# Patient Record
Sex: Male | Born: 1965 | Race: Black or African American | Hispanic: No | Marital: Married | Smoking: Never smoker
Health system: Southern US, Community
[De-identification: ages and names within clinical notes are randomized; demographics above are authoritative.]

## PROBLEM LIST (undated history)

## (undated) DIAGNOSIS — I10 Essential (primary) hypertension: Secondary | ICD-10-CM

## (undated) DIAGNOSIS — Z89511 Acquired absence of right leg below knee: Secondary | ICD-10-CM

## (undated) DIAGNOSIS — E119 Type 2 diabetes mellitus without complications: Secondary | ICD-10-CM

---

## 2005-08-30 ENCOUNTER — Encounter: Payer: Self-pay | Admitting: Cardiology

## 2007-01-30 ENCOUNTER — Encounter
Admission: RE | Admit: 2007-01-30 | Discharge: 2007-01-30 | Payer: Self-pay | Admitting: Physical Medicine and Rehabilitation

## 2007-12-03 DIAGNOSIS — E119 Type 2 diabetes mellitus without complications: Secondary | ICD-10-CM | POA: Insufficient documentation

## 2007-12-03 DIAGNOSIS — M19079 Primary osteoarthritis, unspecified ankle and foot: Secondary | ICD-10-CM | POA: Insufficient documentation

## 2007-12-03 DIAGNOSIS — M1610 Unilateral primary osteoarthritis, unspecified hip: Secondary | ICD-10-CM

## 2007-12-03 DIAGNOSIS — M5137 Other intervertebral disc degeneration, lumbosacral region: Secondary | ICD-10-CM | POA: Insufficient documentation

## 2007-12-03 DIAGNOSIS — M51379 Other intervertebral disc degeneration, lumbosacral region without mention of lumbar back pain or lower extremity pain: Secondary | ICD-10-CM | POA: Insufficient documentation

## 2007-12-03 DIAGNOSIS — M5126 Other intervertebral disc displacement, lumbar region: Secondary | ICD-10-CM | POA: Insufficient documentation

## 2007-12-03 HISTORY — DX: Unilateral primary osteoarthritis, unspecified hip: M16.10

## 2007-12-03 HISTORY — DX: Other intervertebral disc displacement, lumbar region: M51.26

## 2007-12-03 HISTORY — DX: Primary osteoarthritis, unspecified ankle and foot: M19.079

## 2007-12-03 HISTORY — DX: Other intervertebral disc degeneration, lumbosacral region without mention of lumbar back pain or lower extremity pain: M51.379

## 2008-01-29 DIAGNOSIS — M1460 Charcot's joint, unspecified site: Secondary | ICD-10-CM

## 2008-01-29 DIAGNOSIS — G609 Hereditary and idiopathic neuropathy, unspecified: Secondary | ICD-10-CM | POA: Insufficient documentation

## 2008-01-29 HISTORY — DX: Charcot's joint, unspecified site: M14.60

## 2008-08-30 DIAGNOSIS — G8929 Other chronic pain: Secondary | ICD-10-CM | POA: Insufficient documentation

## 2010-09-05 ENCOUNTER — Encounter: Payer: Self-pay | Admitting: Cardiology

## 2010-09-06 ENCOUNTER — Encounter: Payer: Self-pay | Admitting: Cardiology

## 2010-09-11 ENCOUNTER — Encounter: Payer: Self-pay | Admitting: Cardiology

## 2010-09-15 ENCOUNTER — Encounter: Payer: Self-pay | Admitting: Cardiology

## 2010-10-15 ENCOUNTER — Encounter: Payer: Self-pay | Admitting: Cardiology

## 2010-10-15 DIAGNOSIS — I10 Essential (primary) hypertension: Secondary | ICD-10-CM | POA: Insufficient documentation

## 2010-10-15 DIAGNOSIS — R079 Chest pain, unspecified: Secondary | ICD-10-CM | POA: Insufficient documentation

## 2010-10-15 DIAGNOSIS — E119 Type 2 diabetes mellitus without complications: Secondary | ICD-10-CM | POA: Insufficient documentation

## 2010-10-15 DIAGNOSIS — G4733 Obstructive sleep apnea (adult) (pediatric): Secondary | ICD-10-CM | POA: Insufficient documentation

## 2010-10-25 NOTE — Letter (Signed)
Summary: [facility] D/C DR. Wilburt Finlay  [facility] D/C DR. Wilburt Finlay   Imported By: Zachary George 10/15/2010 13:12:30  _____________________________________________________________________  External Attachment:    Type:   Image     Comment:   External Document

## 2010-10-25 NOTE — Medication Information (Signed)
Summary: [facility] D/C MEDICATION SHEET ORDER  [facility] D/C MEDICATION SHEET ORDER   Imported By: Zachary George 10/15/2010 13:18:22  _____________________________________________________________________  External Attachment:    Type:   Image     Comment:   External Document

## 2018-07-23 DIAGNOSIS — M19071 Primary osteoarthritis, right ankle and foot: Secondary | ICD-10-CM

## 2018-07-23 DIAGNOSIS — M19072 Primary osteoarthritis, left ankle and foot: Secondary | ICD-10-CM | POA: Insufficient documentation

## 2018-07-23 HISTORY — DX: Primary osteoarthritis, left ankle and foot: M19.071

## 2018-09-21 DIAGNOSIS — M21969 Unspecified acquired deformity of unspecified lower leg: Secondary | ICD-10-CM

## 2018-09-21 HISTORY — DX: Unspecified acquired deformity of unspecified lower leg: M21.969

## 2018-09-21 HISTORY — DX: Morbid (severe) obesity due to excess calories: E66.01

## 2019-10-29 DIAGNOSIS — U071 COVID-19: Secondary | ICD-10-CM | POA: Insufficient documentation

## 2019-10-29 HISTORY — DX: COVID-19: U07.1

## 2020-06-02 DIAGNOSIS — I119 Hypertensive heart disease without heart failure: Secondary | ICD-10-CM | POA: Insufficient documentation

## 2020-09-25 DIAGNOSIS — G988 Other disorders of nervous system: Secondary | ICD-10-CM | POA: Insufficient documentation

## 2020-09-25 DIAGNOSIS — E1169 Type 2 diabetes mellitus with other specified complication: Secondary | ICD-10-CM | POA: Insufficient documentation

## 2020-09-25 HISTORY — DX: Type 2 diabetes mellitus with other specified complication: E11.69

## 2020-11-01 DIAGNOSIS — E11621 Type 2 diabetes mellitus with foot ulcer: Secondary | ICD-10-CM

## 2020-11-01 HISTORY — DX: Type 2 diabetes mellitus with foot ulcer: E11.621

## 2020-11-04 ENCOUNTER — Inpatient Hospital Stay (HOSPITAL_COMMUNITY)
Admission: AD | Admit: 2020-11-04 | Discharge: 2020-11-09 | DRG: 252 | Disposition: A | Discharge status: Home / Self Care | Payer: Medicare HMO | Source: Other Acute Inpatient Hospital | Attending: Internal Medicine | Admitting: Internal Medicine

## 2020-11-04 ENCOUNTER — Other Ambulatory Visit: Payer: Self-pay

## 2020-11-04 ENCOUNTER — Encounter (HOSPITAL_COMMUNITY): Payer: Self-pay | Admitting: Internal Medicine

## 2020-11-04 DIAGNOSIS — I10 Essential (primary) hypertension: Secondary | ICD-10-CM | POA: Diagnosis present

## 2020-11-04 DIAGNOSIS — Z89421 Acquired absence of other right toe(s): Secondary | ICD-10-CM | POA: Diagnosis not present

## 2020-11-04 DIAGNOSIS — E1152 Type 2 diabetes mellitus with diabetic peripheral angiopathy with gangrene: Secondary | ICD-10-CM | POA: Diagnosis present

## 2020-11-04 DIAGNOSIS — E871 Hypo-osmolality and hyponatremia: Secondary | ICD-10-CM | POA: Diagnosis present

## 2020-11-04 DIAGNOSIS — L089 Local infection of the skin and subcutaneous tissue, unspecified: Secondary | ICD-10-CM | POA: Diagnosis present

## 2020-11-04 DIAGNOSIS — I152 Hypertension secondary to endocrine disorders: Secondary | ICD-10-CM | POA: Diagnosis present

## 2020-11-04 DIAGNOSIS — E876 Hypokalemia: Secondary | ICD-10-CM | POA: Diagnosis not present

## 2020-11-04 DIAGNOSIS — E1121 Type 2 diabetes mellitus with diabetic nephropathy: Secondary | ICD-10-CM | POA: Diagnosis present

## 2020-11-04 DIAGNOSIS — M60871 Other myositis, right ankle and foot: Secondary | ICD-10-CM | POA: Diagnosis present

## 2020-11-04 DIAGNOSIS — Z79899 Other long term (current) drug therapy: Secondary | ICD-10-CM

## 2020-11-04 DIAGNOSIS — E119 Type 2 diabetes mellitus without complications: Secondary | ICD-10-CM

## 2020-11-04 DIAGNOSIS — M19071 Primary osteoarthritis, right ankle and foot: Secondary | ICD-10-CM | POA: Diagnosis present

## 2020-11-04 DIAGNOSIS — L97518 Non-pressure chronic ulcer of other part of right foot with other specified severity: Secondary | ICD-10-CM | POA: Diagnosis present

## 2020-11-04 DIAGNOSIS — D638 Anemia in other chronic diseases classified elsewhere: Secondary | ICD-10-CM | POA: Diagnosis present

## 2020-11-04 DIAGNOSIS — Z794 Long term (current) use of insulin: Secondary | ICD-10-CM

## 2020-11-04 DIAGNOSIS — E43 Unspecified severe protein-calorie malnutrition: Secondary | ICD-10-CM | POA: Diagnosis present

## 2020-11-04 DIAGNOSIS — I739 Peripheral vascular disease, unspecified: Secondary | ICD-10-CM

## 2020-11-04 DIAGNOSIS — R262 Difficulty in walking, not elsewhere classified: Secondary | ICD-10-CM | POA: Diagnosis present

## 2020-11-04 DIAGNOSIS — E11628 Type 2 diabetes mellitus with other skin complications: Secondary | ICD-10-CM

## 2020-11-04 DIAGNOSIS — E1140 Type 2 diabetes mellitus with diabetic neuropathy, unspecified: Secondary | ICD-10-CM | POA: Diagnosis present

## 2020-11-04 DIAGNOSIS — I70235 Atherosclerosis of native arteries of right leg with ulceration of other part of foot: Secondary | ICD-10-CM | POA: Diagnosis present

## 2020-11-04 DIAGNOSIS — E1165 Type 2 diabetes mellitus with hyperglycemia: Secondary | ICD-10-CM | POA: Diagnosis present

## 2020-11-04 DIAGNOSIS — E1159 Type 2 diabetes mellitus with other circulatory complications: Secondary | ICD-10-CM | POA: Diagnosis present

## 2020-11-04 DIAGNOSIS — Z20822 Contact with and (suspected) exposure to covid-19: Secondary | ICD-10-CM | POA: Diagnosis present

## 2020-11-04 DIAGNOSIS — L03115 Cellulitis of right lower limb: Secondary | ICD-10-CM | POA: Diagnosis present

## 2020-11-04 DIAGNOSIS — Z7984 Long term (current) use of oral hypoglycemic drugs: Secondary | ICD-10-CM | POA: Diagnosis not present

## 2020-11-04 DIAGNOSIS — E11621 Type 2 diabetes mellitus with foot ulcer: Secondary | ICD-10-CM | POA: Diagnosis present

## 2020-11-04 DIAGNOSIS — Z6841 Body Mass Index (BMI) 40.0 and over, adult: Secondary | ICD-10-CM

## 2020-11-04 DIAGNOSIS — E1151 Type 2 diabetes mellitus with diabetic peripheral angiopathy without gangrene: Secondary | ICD-10-CM

## 2020-11-04 HISTORY — DX: Local infection of the skin and subcutaneous tissue, unspecified: E11.628

## 2020-11-04 HISTORY — DX: Essential (primary) hypertension: I10

## 2020-11-04 HISTORY — DX: Type 2 diabetes mellitus without complications: E11.9

## 2020-11-04 LAB — HIV ANTIBODY (ROUTINE TESTING W REFLEX): HIV Screen 4th Generation wRfx: NONREACTIVE

## 2020-11-04 LAB — GLUCOSE, CAPILLARY
Glucose-Capillary: 143 mg/dL — ABNORMAL HIGH (ref 70–99)
Glucose-Capillary: 157 mg/dL — ABNORMAL HIGH (ref 70–99)
Glucose-Capillary: 164 mg/dL — ABNORMAL HIGH (ref 70–99)
Glucose-Capillary: 143 mg/dL — ABNORMAL HIGH (ref 70–99)

## 2020-11-04 LAB — COMPREHENSIVE METABOLIC PANEL
ALT: 23 U/L (ref 0–44)
AST: 24 U/L (ref 15–41)
Albumin: 2.2 g/dL — ABNORMAL LOW (ref 3.5–5.0)
Alkaline Phosphatase: 81 U/L (ref 38–126)
Anion gap: 9 (ref 5–15)
BUN: 11 mg/dL (ref 6–20)
CO2: 23 mmol/L (ref 22–32)
Calcium: 8.1 mg/dL — ABNORMAL LOW (ref 8.9–10.3)
Chloride: 100 mmol/L (ref 98–111)
Creatinine, Ser: 0.94 mg/dL (ref 0.61–1.24)
GFR, Estimated: >60 mL/min (ref >60)
Glucose, Bld: 150 mg/dL — ABNORMAL HIGH (ref 70–99)
Potassium: 3.7 mmol/L (ref 3.5–5.1)
Sodium: 132 mmol/L — ABNORMAL LOW (ref 135–145)
Total Bilirubin: 1.3 mg/dL — ABNORMAL HIGH (ref 0.3–1.2)
Total Protein: 6 g/dL — ABNORMAL LOW (ref 6.5–8.1)

## 2020-11-04 LAB — CBC
HCT: 33.3 % — ABNORMAL LOW (ref 39.0–52.0)
Hemoglobin: 10.9 g/dL — ABNORMAL LOW (ref 13.0–17.0)
MCH: 26.6 pg (ref 26.0–34.0)
MCHC: 32.7 g/dL (ref 30.0–36.0)
MCV: 81.2 fL (ref 80.0–100.0)
Platelets: 433 10*3/uL — ABNORMAL HIGH (ref 150–400)
RBC: 4.1 MIL/uL — ABNORMAL LOW (ref 4.22–5.81)
RDW: 12.6 % (ref 11.5–15.5)
WBC: 14.3 10*3/uL — ABNORMAL HIGH (ref 4.0–10.5)
nRBC: 0 % (ref 0.0–0.2)

## 2020-11-04 LAB — LACTIC ACID, PLASMA
Lactic Acid, Venous: 1 mmol/L (ref 0.5–1.9)
Lactic Acid, Venous: 1.1 mmol/L (ref 0.5–1.9)

## 2020-11-04 LAB — HEMOGLOBIN A1C
Hgb A1c MFr Bld: 9.9 % — ABNORMAL HIGH (ref 4.8–5.6)
Mean Plasma Glucose: 237.43 mg/dL

## 2020-11-04 MED ORDER — SODIUM CHLORIDE 0.9 % IV SOLN: INTRAVENOUS | Status: DC

## 2020-11-04 MED ORDER — SODIUM CHLORIDE 0.9 % IV BOLUS
1000.0000 mL | Freq: Once | INTRAVENOUS | Status: AC
  Administered 2020-11-04: 1000 mL via INTRAVENOUS

## 2020-11-04 MED ORDER — MORPHINE SULFATE (PF) 2 MG/ML IV SOLN
1.0000 mg | INTRAVENOUS | Status: DC | PRN
  Administered 2020-11-04 – 2020-11-06 (×6): 1 mg via INTRAVENOUS
  Filled 2020-11-04 (×8): qty 1

## 2020-11-04 MED ORDER — HEPARIN SODIUM (PORCINE) 5000 UNIT/ML IJ SOLN
5000.0000 [IU] | Freq: Three times a day (TID) | INTRAMUSCULAR | Status: DC
  Administered 2020-11-04 – 2020-11-09 (×15): 5000 [IU] via SUBCUTANEOUS
  Filled 2020-11-04 (×14): qty 1

## 2020-11-04 MED ORDER — METFORMIN HCL ER 500 MG PO TB24
1000.0000 mg | ORAL_TABLET | Freq: Every day | ORAL | Status: DC
  Administered 2020-11-05: 1000 mg via ORAL
  Filled 2020-11-04: qty 2

## 2020-11-04 MED ORDER — INSULIN ASPART 100 UNIT/ML ~~LOC~~ SOLN: 0.0000 [IU] | Freq: Every day | SUBCUTANEOUS | Status: DC

## 2020-11-04 MED ORDER — OXYCODONE-ACETAMINOPHEN 5-325 MG PO TABS
2.0000 | ORAL_TABLET | ORAL | Status: DC | PRN
Start: 2020-11-04 — End: 2020-11-09
  Administered 2020-11-04 – 2020-11-08 (×12): 2 via ORAL
  Filled 2020-11-04 (×12): qty 2

## 2020-11-04 MED ORDER — INSULIN ASPART 100 UNIT/ML ~~LOC~~ SOLN
0.0000 [IU] | Freq: Three times a day (TID) | SUBCUTANEOUS | Status: DC
  Administered 2020-11-04 – 2020-11-08 (×7): 3 [IU] via SUBCUTANEOUS

## 2020-11-04 MED ORDER — LISINOPRIL 40 MG PO TABS
40.0000 mg | ORAL_TABLET | Freq: Every day | ORAL | Status: DC
  Administered 2020-11-04 – 2020-11-09 (×6): 40 mg via ORAL
  Filled 2020-11-04 (×7): qty 1

## 2020-11-04 MED ORDER — CLINDAMYCIN PHOSPHATE 600 MG/50ML IV SOLN
600.0000 mg | Freq: Three times a day (TID) | INTRAVENOUS | Status: AC
  Administered 2020-11-04 – 2020-11-07 (×10): 600 mg via INTRAVENOUS
  Filled 2020-11-04 (×9): qty 50

## 2020-11-04 MED ORDER — PIPERACILLIN-TAZOBACTAM 3.375 G IVPB
3.3750 g | Freq: Three times a day (TID) | INTRAVENOUS | Status: DC
  Administered 2020-11-04 – 2020-11-09 (×15): 3.375 g via INTRAVENOUS
  Filled 2020-11-04 (×15): qty 50

## 2020-11-04 MED ORDER — INSULIN DETEMIR 100 UNIT/ML ~~LOC~~ SOLN
35.0000 [IU] | Freq: Every day | SUBCUTANEOUS | Status: DC
  Administered 2020-11-04: 35 [IU] via SUBCUTANEOUS
  Filled 2020-11-04 (×3): qty 0.35

## 2020-11-04 NOTE — Consult Note (Signed)
Referring Physician: Dr Delight Ovens  Patient name: Roy Taylor MRN: [id number] DOB: 21-Nov-1965 Sex: male  REASON FOR CONSULT: non healing wound right foot  HPI: Roy Taylor is a 55 y.o. male, with a several day history of a wound on his right 5th toe.  He thinks this may have started with a new pair of shoes.  He was seen at [facility] and had amputation of right 5th toe for gangrene.  He was transferred here for further evaluation due to decreased ABI.  He has no history of smoking.  His baseline A1C is around 9.  He has no history of claudication.  He is ambulatory but is on disability due to problems walking secondary to a right ankle degenerative arthritis.  Other medical problems include hypertension.  Past Medical History:  Diagnosis Date  . Diabetes mellitus without complication (HCC)   . Hypertension    History reviewed. No pertinent surgical history.  History reviewed. No pertinent family history.  SOCIAL HISTORY: Social History   Socioeconomic History  . Marital status: Married    Spouse name: Not on file  . Number of children: Not on file  . Years of education: Not on file  . Highest education level: Not on file  Occupational History  . Not on file  Tobacco Use  . Smoking status: Never Smoker  . Smokeless tobacco: Never Used  Substance and Sexual Activity  . Alcohol use: Not Currently  . Drug use: Never  . Sexual activity: Not on file  Other Topics Concern  . Not on file  Social History Narrative  . Not on file   Social Determinants of Health   Financial Resource Strain: Not on file  Food Insecurity: Not on file  Transportation Needs: Not on file  Physical Activity: Not on file  Stress: Not on file  Social Connections: Not on file  Intimate Partner Violence: Not on file    No Known Allergies  Current Facility-Administered Medications  Medication Dose Route Frequency Provider Last Rate Last Admin  . clindamycin (CLEOCIN) IVPB 600 mg  600  mg Intravenous Q8H John Giovanni, MD      . heparin injection 5,000 Units  5,000 Units Subcutaneous Q8H John Giovanni, MD      . insulin aspart (novoLOG) injection 0-15 Units  0-15 Units Subcutaneous TID WC John Giovanni, MD      . insulin aspart (novoLOG) injection 0-5 Units  0-5 Units Subcutaneous QHS John Giovanni, MD      . insulin detemir (LEVEMIR) injection 35 Units  35 Units Subcutaneous QHS John Giovanni, MD      . lisinopril (ZESTRIL) tablet 40 mg  40 mg Oral Daily John Giovanni, MD      . morphine 2 MG/ML injection 1 mg  1 mg Intravenous Q3H PRN John Giovanni, MD      . piperacillin-tazobactam (ZOSYN) IVPB 3.375 g  3.375 g Intravenous Q8H Pham, Mimi L, RPH      . sodium chloride 0.9 % bolus 1,000 mL  1,000 mL Intravenous Once John Giovanni, MD        ROS:   General:  No weight loss, Fever, chills  HEENT: No recent headaches, no nasal bleeding, no visual changes, no sore throat  Neurologic: No dizziness, blackouts, seizures. No recent symptoms of stroke or mini- stroke. No recent episodes of slurred speech, or temporary blindness.  Cardiac: No recent episodes of chest pain/pressure, no shortness of breath at rest.  No shortness of breath  with exertion.  Denies history of atrial fibrillation or irregular heartbeat  Vascular: No history of rest pain in feet.  No history of claudication.  No history of non-healing ulcer, No history of DVT   Pulmonary: No home oxygen, no productive cough, no hemoptysis,  No asthma or wheezing  Musculoskeletal:  [X]  Arthritis, [ ]  Low back pain,  [X]  Joint pain  Hematologic:No history of hypercoagulable state.  No history of easy bleeding.  No history of anemia  Gastrointestinal: No hematochezia or melena,  No gastroesophageal reflux, no trouble swallowing  Urinary: [ ]  chronic Kidney disease, [ ]  on HD - [ ]  MWF or [ ]  TTHS, [ ]  Burning with urination, [ ]  Frequent urination, [ ]  Difficulty urinating;    Skin: No rashes  Psychological: No history of anxiety,  No history of depression   Physical Examination  Vitals:   11/04/20 0408  BP: (!) 181/79  Pulse: (!) 110  Resp: 17  Temp: 98.7 F (37.1 C)  TempSrc: Oral  SpO2: 94%  Weight: (!) 155 kg  Height: 6\' 3"  (1.905 m)    Body mass index is 42.71 kg/m.  General:  Alert and oriented, no acute distress HEENT: Normal Neck: No JVD Cardiac: Regular Rate and Rhythm Abdomen: Soft, non-tender, non-distended, no mass, obese Skin: No rash, open wound right lateral foot no purulence, pale appearing tissues Extremity Pulses:  2+ radial, brachial, femoral, absent dorsalis pedis, posterior tibial pulses bilaterally Musculoskeletal: No deformity or edema  Neurologic: Upper and lower extremity motor 5/5 and symmetric  DATA:  CBC    Component Value Date/Time   WBC 14.3 (H) 11/04/2020 0821   RBC 4.10 (L) 11/04/2020 0821   HGB 10.9 (L) 11/04/2020 0821   HCT 33.3 (L) 11/04/2020 0821   PLT 433 (H) 11/04/2020 0821   MCV 81.2 11/04/2020 0821   MCH 26.6 11/04/2020 0821   MCHC 32.7 11/04/2020 0821   RDW 12.6 11/04/2020 0821    BMET    Component Value Date/Time   NA 132 (L) 11/04/2020 0821   K 3.7 11/04/2020 0821   CL 100 11/04/2020 0821   CO2 23 11/04/2020 0821   GLUCOSE 150 (H) 11/04/2020 0821   BUN 11 11/04/2020 0821   CREATININE 0.94 11/04/2020 0821   CALCIUM 8.1 (L) 11/04/2020 0821   GFRNONAA >60 11/04/2020 0821   ABI [facility]  Right 0.37 left 0.72  ASSESSMENT: Pt with 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 He is at high risk for limb loss.   PLAN:  D/w pt lower extremity agram on Monday or Tuesday next week with possible options of PTA/stent versus bypass versus unreconstructable requiring amputation of leg.  Procedure risk and benefits possible complications discussed as well at possibilities if unreconstructable  Would continue antibiotics and local wound care for now   11/06/2020, MD [facility] Office: [phone]

## 2020-11-04 NOTE — Progress Notes (Signed)
Pharmacy Antibiotic Note  Roy Taylor is a 55 y.o. male admitted on 11/04/2020 with cellulitis of R foot without osteomyelitis.  Pharmacy has been consulted for Zosyn dosing.  Patient has been on clindamycin and Zosyn, started at outside facility, since 3/16. Patient underwent right foot digit amputation on 3/17 followed by debridement on 3/18. Currently afebrile, no labs yet.  Plan: - Start Zosyn 3.375g IV q8h (4 hour infusion).  - Continue clindamycin 600 mg IV every 8 hours [MD dosing] - Follow-up cultures - Monitor clinical status and infection progress - Follow-up de-escalation plans and establish duration of therapy  Height: 6\' 3"  (190.5 cm) Weight: (!) 155 kg (341 lb 11.4 oz) IBW/kg (Calculated) : 84.5  Temp (24hrs), Avg:98.7 F (37.1 C), Min:98.7 F (37.1 C), Max:98.7 F (37.1 C)  No results for input(s): WBC, CREATININE, LATICACIDVEN, VANCOTROUGH, VANCOPEAK, VANCORANDOM, GENTTROUGH, GENTPEAK, GENTRANDOM, TOBRATROUGH, TOBRAPEAK, TOBRARND, AMIKACINPEAK, AMIKACINTROU, AMIKACIN in the last 168 hours.  CrCl cannot be calculated (No successful lab value found.).    No Known Allergies  Antimicrobials this admission: Clindamycin 3/16 >> Zosyn 3/16 >>  Microbiology results: N/a     Trista Ciocca L. 4/16, PharmD, MBA [facility] PGY2 Pharmacy Resident Weekends 7:00 am - 3:00 pm, please call [phone] 11/04/20      7:56 AM  Please check AMION for all [facility] Pharmacy phone numbers After 10:00 PM, call the Main Pharmacy [phone]

## 2020-11-04 NOTE — H&P (Signed)
History and Physical    Roy Taylor UKG:[id number] DOB: [birth year] DOA: 11/04/2020  PCP: Renaldo Harrison, DO Patient coming from: [facility]  Chief Complaint: Sent here due to concern for limb ischemia  HPI: Roy Taylor is a 55 y.o. male with medical history significant of insulin-dependent type 2 diabetes, hypertension presented to [facility] ED on 11/01/2020 complaining of swelling and pain of his right foot for the past month along with an ulcer in his right pinky toe which had been draining.  Patient noted to have ulceration and dry gangrene of the fifth digit of his right foot and had purulent drainage in the webspace between the fourth and fifth toes. MRI of right lower extremity revealed findings concerning for cellulitis involving the toes and forefoot and myofasciitis without pyomyositis, septic arthritis, or osteomyelitis.  Patient was treated with IV antibiotics (clindamycin and Zosyn).  He underwent fifth ray amputation of the right foot on 3/17 and debridement of skin and subcutaneous tissue on 3/18. Bilateral ankle-brachial indices concerning for arterial occlusive disease (R 0.37, L 0.72).  Transferred to [facility] for vascular surgery evaluation.  Patient thinks he hit the fifth digit of his right foot against something about 2 weeks ago and since then his toe started turning black.  States he had 2 surgeries done at [facility].  He denies any pain in this foot.  Denies history of pain in legs while walking.  States he takes insulin for his diabetes and his last A1c was around 9.  He has no other complaints.  Denies fevers, cough, shortness of breath, chest pain, nausea, vomiting, abdominal pain, or diarrhea.  Review of Systems:  All systems reviewed and apart from history of presenting illness, are negative.  Past Medical History:  Diagnosis Date  . Diabetes mellitus without complication (HCC)   . Hypertension     History reviewed. No pertinent surgical history.   reports that he  has never smoked. He has never used smokeless tobacco. He reports previous alcohol use. He reports that he does not use drugs.  No Known Allergies  History reviewed. No pertinent family history.  Prior to Admission medications   Medication Sig Start Date End Date Taking? Authorizing Provider  insulin detemir (LEVEMIR) 100 UNIT/ML injection Inject 35 Units into the skin at bedtime.   Yes [provider]  lisinopril (ZESTRIL) 40 MG tablet Take 40 mg by mouth daily.   Yes [provider]  metformin (FORTAMET) 1000 MG (OSM) 24 hr tablet Take 1,000 mg by mouth daily with breakfast.   Yes [provider]    Physical Exam: Vitals:   11/04/20 0408  BP: (!) 181/79  Pulse: (!) 110  Resp: 17  Temp: 98.7 F (37.1 C)  TempSrc: Oral  SpO2: 94%  Weight: (!) 155 kg  Height: 6\' 3"  (1.905 m)    Physical Exam Constitutional:      General: He is not in acute distress. HENT:     Head: Normocephalic and atraumatic.  Eyes:     Extraocular Movements: Extraocular movements intact.     Conjunctiva/sclera: Conjunctivae normal.  Cardiovascular:     Rate and Rhythm: Normal rate and regular rhythm.     Pulses: Normal pulses.  Pulmonary:     Effort: Pulmonary effort is normal. No respiratory distress.     Breath sounds: Normal breath sounds. No wheezing or rales.  Abdominal:     General: Bowel sounds are normal. There is no distension.     Palpations: Abdomen is  soft.     Tenderness: There is no abdominal tenderness.  Musculoskeletal:     Cervical back: Normal range of motion and neck supple.     Comments: Dorsalis pedis pulse weak but palpable bilaterally Both feet warm to touch  Skin:    General: Skin is warm and dry.  Neurological:     General: No focal deficit present.     Mental Status: He is alert and oriented to person, place, and time.     Labs on Admission: I have personally reviewed following labs and imaging studies  CBC: No results for input(s):  WBC, NEUTROABS, HGB, HCT, MCV, PLT in the last 168 hours. Basic Metabolic Panel: No results for input(s): NA, K, CL, CO2, GLUCOSE, BUN, CREATININE, CALCIUM, MG, PHOS in the last 168 hours. GFR: CrCl cannot be calculated (No successful lab value found.). Liver Function Tests: No results for input(s): AST, ALT, ALKPHOS, BILITOT, PROT, ALBUMIN in the last 168 hours. No results for input(s): LIPASE, AMYLASE in the last 168 hours. No results for input(s): AMMONIA in the last 168 hours. Coagulation Profile: No results for input(s): INR, PROTIME in the last 168 hours. Cardiac Enzymes: No results for input(s): CKTOTAL, CKMB, CKMBINDEX, TROPONINI in the last 168 hours. BNP (last 3 results) No results for input(s): PROBNP in the last 8760 hours. HbA1C: No results for input(s): HGBA1C in the last 72 hours. CBG: No results for input(s): GLUCAP in the last 168 hours. Lipid Profile: No results for input(s): CHOL, HDL, LDLCALC, TRIG, CHOLHDL, LDLDIRECT in the last 72 hours. Thyroid Function Tests: No results for input(s): TSH, T4TOTAL, FREET4, T3FREE, THYROIDAB in the last 72 hours. Anemia Panel: No results for input(s): VITAMINB12, FOLATE, FERRITIN, TIBC, IRON, RETICCTPCT in the last 72 hours. Urine analysis: No results found for: COLORURINE, APPEARANCEUR, LABSPEC, PHURINE, GLUCOSEU, HGBUR, BILIRUBINUR, KETONESUR, PROTEINUR, UROBILINOGEN, NITRITE, LEUKOCYTESUR  Radiological Exams on Admission: No results found.  Assessment/Plan Principal Problem:   PAD (peripheral artery disease) (HCC) Active Problems:   Diabetes (HCC)   HTN (hypertension)   Diabetic foot infection (HCC)   Diabetic foot infection with gangrene of right foot fifth digit status post amputation on 3/7 and debridement on 3/18 Severe PAD/ acute limb ischemia -MRI of right lower extremity revealed findings concerning for cellulitis involving the toes and forefoot and myofasciitis without pyomyositis, septic arthritis, or  osteomyelitis. -Status post amputation of right foot fifth digit on 3/7 and debridement on 3/18 -He was treated with IV antibiotics including clindamycin and Zosyn, continue antibiotics. -Slightly tachycardic at present.  Order labs to check WBC count and lactate.  Blood culture x2 ordered.  Give IV fluid hydration. -Bilateral ankle-brachial indices concerning for arterial occlusive disease (R 0.37, L 0.72). -On exam, dorsalis pedis pulse weak but palpable bilaterally and feet warm to touch. -Vascular surgery consulted. -Pain management  Insulin-dependent type 2 diabetes -Check A1c.  Sliding scale insulin ACHS.  Continue Lantus 35 units at bedtime.   Hypertension: Blood pressure elevated with systolic up to 180s when patient arrived to [facility] and per RN was complaining of pain at that time. -Pain management.  Continue lisinopril.  Hydralazine PRN.   DVT prophylaxis: Subcutaneous heparin Code Status: Full code Family Communication: No family available at this time Disposition Plan: Status is: Inpatient  Remains inpatient appropriate because:Inpatient level of care appropriate due to severity of illness   Dispo: The patient is from: Home              Anticipated d/c is to:  Home              Patient currently is not medically stable to d/c.   Difficult to place patient No  Consults called: Vascular surgery (Dr. Darrick Penna) Level of care: Level of care: Telemetry Medical   The medical decision making on this patient was of high complexity and the patient is at high risk for clinical deterioration, therefore this is a level 3 visit.  John Giovanni MD [facility]  If 7PM-7AM, please contact night-coverage [email or web address]  11/04/2020, 7:27 AM

## 2020-11-04 NOTE — Progress Notes (Signed)
Patient arrived from [facility] via [facility] transport. On call text paged, awaiting new admission orders.

## 2020-11-04 NOTE — Progress Notes (Signed)
I agree with plan of care as per my partner Dr. Loney Loh who evaluated this patient this morning   55 year old diabetic male {dm ty ii] x 10 yrs--poor control Diabetic neuropathy/nephropathy  with severe vascular disease?  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  Osteo-MRI pending-vascular consulted  +  ABI = occlusive disease  Transferred from [facility]  labs  Mild hyponatremia, hypocalcemia with trace bilirubinemia A1c 9.9 Lactic acid 1 WBC 14.3-cultures pending from 3/19  O/e BP (!) 181/79 (BP Location: Right Arm)   Pulse (!) 110   Temp 98.7 F (37.1 C) (Oral)   Resp 17   Ht 6\' 3"  (1.905 m)   Wt (!) 155 kg   SpO2 94%   BMI 42.71 kg/m     P  appreciate VVS Dr. input-angiogram next week per them Supportive management Continue clindamycin Cycle lactic acid monitor trend  We will resume PTA Metformin + Lantus + SSI but can be discontinued prior to angiogram if needed  Rest of orders as per Dr. Darrick Penna and I have reviewed them I will talk to wife tomorrow and update  No charge  Loney Loh, MD [facility] 10:05 AM

## 2020-11-04 NOTE — Plan of Care (Signed)
Patient settled into room. Admission assessment started. Will continue to monitor according to plan of care and orders.

## 2020-11-05 LAB — GLUCOSE, CAPILLARY
Glucose-Capillary: 84 mg/dL (ref 70–99)
Glucose-Capillary: 86 mg/dL (ref 70–99)
Glucose-Capillary: 99 mg/dL (ref 70–99)
Glucose-Capillary: 161 mg/dL — ABNORMAL HIGH (ref 70–99)

## 2020-11-05 MED ORDER — IBUPROFEN 600 MG PO TABS
600.0000 mg | ORAL_TABLET | Freq: Four times a day (QID) | ORAL | Status: DC
  Administered 2020-11-05 – 2020-11-09 (×15): 600 mg via ORAL
  Filled 2020-11-05 (×14): qty 1

## 2020-11-05 MED ORDER — INSULIN DETEMIR 100 UNIT/ML ~~LOC~~ SOLN
28.0000 [IU] | Freq: Every day | SUBCUTANEOUS | Status: DC
  Administered 2020-11-05 – 2020-11-08 (×4): 28 [IU] via SUBCUTANEOUS
  Filled 2020-11-05 (×6): qty 0.28

## 2020-11-05 MED ORDER — INSULIN DETEMIR 100 UNIT/ML ~~LOC~~ SOLN
20.0000 [IU] | Freq: Every day | SUBCUTANEOUS | Status: DC
  Filled 2020-11-05: qty 0.2

## 2020-11-05 NOTE — Progress Notes (Signed)
Arteriogram Tuesday with Dr. Frazier Butt, MD [facility] [place] Office: [phone]

## 2020-11-05 NOTE — Progress Notes (Signed)
PROGRESS NOTE   Roy Taylor  DDU:[id number] DOB: 1965/11/15 DOA: 11/04/2020 PCP: Renaldo Harrison, DO  Brief Narrative:  55 year old diabetic male dm ty ii x 10 yrs--poor control Diabetic neuropathy/nephropathy  with severe vascular disease?  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  Osteo-vascular consulted  +  ABI = occlusive disease  Transferred from [facility]  labs  Mild hyponatremia, hypocalcemia with trace bilirubinemia A1c 9.9 Lactic acid 1 WBC 14.3-cultures pending from 3/19  Hospital-Problem based course  Cellulitis/,myofasciitis R foot Angiogram 3/22 further discussions dependent on that Continue Zosyn clindamycin -saline 75 cc/H-cultures pending at this time Poorly controlled DM TY 2 A1c 9 Slightly low sugar this morning so cutting back Levemir to 28 3 times daily sliding scale coverage Holding Metformin at this time Labs a.m. BMI 42 Outpatient discussion with PCP    DVT prophylaxis: Heparin Code Status: Full Family Communication: Discussed with patient's relative at the bedside in detail Disposition:  Status is: Inpatient  Remains inpatient appropriate because:Persistent severe electrolyte disturbances, Unsafe d/c plan and Inpatient level of care appropriate due to severity of illness   Dispo: The patient is from: Home              Anticipated d/c is to: Unclear at this time need to see how he does with mobility              Patient currently is not medically stable to d/c.   Difficult to place patient No       Consultants:   Vascular surgeon  Procedures:   Antimicrobials:     Subjective: Doing fair pain is however not controlled He is getting higher dose of Percocet I have added ibuprofen we had a good discussion today about Weight loss strategies and discussion with his family member in the room  Objective: Vitals:   11/04/20 1600 11/04/20 2219 11/05/20 0432 11/05/20 1347  BP: 127/78  (!) 182/83 (!) 185/76 (!) 157/86  Pulse: (!) 106 (!) 102 (!) 109 (!) 106  Resp: 18  17 18   Temp: 98.2 F (36.8 C) 98.2 F (36.8 C)  98.3 F (36.8 C)  TempSrc: Oral Oral    SpO2: 92% 96% 95% 96%  Weight:      Height:        Intake/Output Summary (Last 24 hours) at 11/05/2020 1418 Last data filed at 11/05/2020 0421 Gross per 24 hour  Intake 1042.38 ml  Output --  Net 1042.38 ml   Filed Weights   11/04/20 0408  Weight: (!) 155 kg    Examination: EOMI NCAT thick neck Mallampati 4 S1-S2 no murmur Chest clear no rales Abdomen soft no rebound no guarding Neurologically intact no focal deficit Psych euthymic   Data Reviewed: personally reviewed   CBC    Component Value Date/Time   WBC 14.3 (H) 11/04/2020 0821   RBC 4.10 (L) 11/04/2020 0821   HGB 10.9 (L) 11/04/2020 0821   HCT 33.3 (L) 11/04/2020 0821   PLT 433 (H) 11/04/2020 0821   MCV 81.2 11/04/2020 0821   MCH 26.6 11/04/2020 0821   MCHC 32.7 11/04/2020 0821   RDW 12.6 11/04/2020 0821   CMP Latest Ref Rng & Units 11/04/2020  Glucose 70 - 99 mg/dL 11/06/2020)  BUN 6 - 20 mg/dL 11  Creatinine 237(S - 2.83 mg/dL 1.51  Sodium 7.61 - 607 mmol/L 132(L)  Potassium 3.5 - 5.1 mmol/L 3.7  Chloride 98 - 111 mmol/L 100  CO2 22 - 32  mmol/L 23  Calcium 8.9 - 10.3 mg/dL 8.1(L)  Total Protein 6.5 - 8.1 g/dL 6.0(L)  Total Bilirubin 0.3 - 1.2 mg/dL 6.5(H)  Alkaline Phos 38 - 126 U/L 81  AST 15 - 41 U/L 24  ALT 0 - 44 U/L 23     Radiology Studies: No results found.   Scheduled Meds: . heparin  5,000 Units Subcutaneous Q8H  . ibuprofen  600 mg Oral QID  . insulin aspart  0-15 Units Subcutaneous TID WC  . insulin aspart  0-5 Units Subcutaneous QHS  . insulin detemir  28 Units Subcutaneous QHS  . lisinopril  40 mg Oral Daily   Continuous Infusions: . sodium chloride 75 mL/hr at 11/05/20 0421  . clindamycin (CLEOCIN) IV 600 mg (11/05/20 1343)  . piperacillin-tazobactam (ZOSYN)  IV 3.375 g (11/05/20 1339)     LOS: 1 day    Time spent: 30 minutes  Rhetta Mura, MD [facility] To contact the attending provider between 7A-7P or the covering provider during after hours 7P-7A, please log into the web site [email or web address] and access using universal [place] password for that web site. If you do not have the password, please call the hospital operator.  11/05/2020, 2:18 PM

## 2020-11-06 LAB — COMPREHENSIVE METABOLIC PANEL
ALT: 28 U/L (ref 0–44)
AST: 32 U/L (ref 15–41)
Albumin: 1.7 g/dL — ABNORMAL LOW (ref 3.5–5.0)
Alkaline Phosphatase: 117 U/L (ref 38–126)
Anion gap: 8 (ref 5–15)
BUN: 10 mg/dL (ref 6–20)
CO2: 25 mmol/L (ref 22–32)
Calcium: 8.1 mg/dL — ABNORMAL LOW (ref 8.9–10.3)
Chloride: 99 mmol/L (ref 98–111)
Creatinine, Ser: 1.15 mg/dL (ref 0.61–1.24)
GFR, Estimated: >60 mL/min (ref >60)
Glucose, Bld: 213 mg/dL — ABNORMAL HIGH (ref 70–99)
Potassium: 2.9 mmol/L — ABNORMAL LOW (ref 3.5–5.1)
Sodium: 132 mmol/L — ABNORMAL LOW (ref 135–145)
Total Bilirubin: 0.8 mg/dL (ref 0.3–1.2)
Total Protein: 5.8 g/dL — ABNORMAL LOW (ref 6.5–8.1)

## 2020-11-06 LAB — CBC WITH DIFFERENTIAL/PLATELET
Abs Immature Granulocytes: 0.15 10*3/uL — ABNORMAL HIGH (ref 0.00–0.07)
Basophils Absolute: 0.1 10*3/uL (ref 0.0–0.1)
Basophils Relative: 0 %
Eosinophils Absolute: 0.1 10*3/uL (ref 0.0–0.5)
Eosinophils Relative: 1 %
HCT: 31.5 % — ABNORMAL LOW (ref 39.0–52.0)
Hemoglobin: 10.4 g/dL — ABNORMAL LOW (ref 13.0–17.0)
Immature Granulocytes: 1 %
Lymphocytes Relative: 12 %
Lymphs Abs: 1.7 10*3/uL (ref 0.7–4.0)
MCH: 26.8 pg (ref 26.0–34.0)
MCHC: 33 g/dL (ref 30.0–36.0)
MCV: 81.2 fL (ref 80.0–100.0)
Monocytes Absolute: 1.2 10*3/uL — ABNORMAL HIGH (ref 0.1–1.0)
Monocytes Relative: 8 %
Neutro Abs: 10.8 10*3/uL — ABNORMAL HIGH (ref 1.7–7.7)
Neutrophils Relative %: 78 %
Platelets: 445 10*3/uL — ABNORMAL HIGH (ref 150–400)
RBC: 3.88 MIL/uL — ABNORMAL LOW (ref 4.22–5.81)
RDW: 13 % (ref 11.5–15.5)
WBC: 14.1 10*3/uL — ABNORMAL HIGH (ref 4.0–10.5)
nRBC: 0 % (ref 0.0–0.2)

## 2020-11-06 LAB — GLUCOSE, CAPILLARY
Glucose-Capillary: 157 mg/dL — ABNORMAL HIGH (ref 70–99)
Glucose-Capillary: 152 mg/dL — ABNORMAL HIGH (ref 70–99)
Glucose-Capillary: 189 mg/dL — ABNORMAL HIGH (ref 70–99)
Glucose-Capillary: 172 mg/dL — ABNORMAL HIGH (ref 70–99)

## 2020-11-06 MED ORDER — POTASSIUM CHLORIDE CRYS ER 20 MEQ PO TBCR
40.0000 meq | EXTENDED_RELEASE_TABLET | Freq: Two times a day (BID) | ORAL | Status: DC
  Administered 2020-11-06 – 2020-11-09 (×7): 40 meq via ORAL
  Filled 2020-11-06 (×7): qty 2

## 2020-11-06 MED ORDER — PROSOURCE TF PO LIQD
45.0000 mL | Freq: Two times a day (BID) | ORAL | Status: DC
  Administered 2020-11-06 – 2020-11-08 (×3): 45 mL
  Filled 2020-11-06 (×8): qty 45

## 2020-11-06 NOTE — Progress Notes (Signed)
Initial Nutrition Assessment  DOCUMENTATION CODES:   Obesity unspecified  INTERVENTION:   30 ml ProSource Plus BID, each supplement provides 100 kcals and 15 grams protein.   Add MVI with Minerals daily  NUTRITION DIAGNOSIS:   Increased nutrient needs related to wound healing as evidenced by estimated needs.  GOAL:   Patient will meet greater than or equal to 90% of their needs  MONITOR:   PO intake,Supplement acceptance,Labs,Weight trends,Skin  REASON FOR ASSESSMENT:   Consult Calorie Count,Assessment of nutrition requirement/status  ASSESSMENT:   55 yo male admitted with cellulitis/myofascitis of right foot with PAD, poorly controlled DM. PMH DM, HTN   Plan for RLE arteriogram with possible intervention tomorrow  Pt sitting up in chair on visit today, very sleepy.   Pt with good appetite; recorded po intake 100% of meals  Pt reports good appetite, eating well PTA. Pt denies any recent wt changes.   Lab Results  Component Value Date   HGBA1C 9.9 (H) 11/04/2020   Discussed importance of good blood glucose control with regards to wound healing; pt verbalized understanding. Plan to follow-up as able and provide diet education.   Labs: potassium 2.9 (L), sodium 132, Creatinine wdl, CBGs 84-161 Meds: ss novolog with meals and supplements, levemir 28 units daily, KCl   Diet Order:   Diet Order            Diet NPO time specified  Diet effective midnight           Diet Carb Modified Fluid consistency: Thin; Room service appropriate? Yes  Diet effective now                 EDUCATION NEEDS:   Education needs have been addressed  Skin:  Skin Assessment: Skin Integrity Issues: Skin Integrity Issues:: Diabetic Ulcer Diabetic Ulcer: cellulitis/myofascitis of right foot  Last BM:  3/17  Height:   Ht Readings from Last 1 Encounters:  11/06/20 6' 3.5" (1.918 m)    Weight:   Wt Readings from Last 1 Encounters:  11/04/20 (!) 155 kg    Ideal Body  Weight:  91.8 kg  BMI:  Body mass index is 42.15 kg/m.  Estimated Nutritional Needs:   Kcal:  2300-2600 kcals  Protein:  130-150 g  Fluid:  >/= 2 L   Romelle Starcher MS, RDN, LDN, CNSC Registered Dietitian III Clinical Nutrition RD Pager and On-Call Pager Number Located in [place]

## 2020-11-06 NOTE — Progress Notes (Addendum)
  Progress Note    11/06/2020 7:45 AM  Subjective:  No complaints   Vitals:   11/05/20 2001 11/06/20 0521  BP: (!) 152/84 (!) 153/95  Pulse: (!) 108 84  Resp: 20 17  Temp:  (!) 97.5 F (36.4 C)  SpO2: 93% 94%   Physical Exam: Lungs:  Non labored Extremities:  R foot dressing left in place Neurologic: A&O  CBC    Component Value Date/Time   WBC 14.1 (H) 11/06/2020 0136   RBC 3.88 (L) 11/06/2020 0136   HGB 10.4 (L) 11/06/2020 0136   HCT 31.5 (L) 11/06/2020 0136   PLT 445 (H) 11/06/2020 0136   MCV 81.2 11/06/2020 0136   MCH 26.8 11/06/2020 0136   MCHC 33.0 11/06/2020 0136   RDW 13.0 11/06/2020 0136   LYMPHSABS 1.7 11/06/2020 0136   MONOABS 1.2 (H) 11/06/2020 0136   EOSABS 0.1 11/06/2020 0136   BASOSABS 0.1 11/06/2020 0136    BMET    Component Value Date/Time   NA 132 (L) 11/06/2020 0136   K 2.9 (L) 11/06/2020 0136   CL 99 11/06/2020 0136   CO2 25 11/06/2020 0136   GLUCOSE 213 (H) 11/06/2020 0136   BUN 10 11/06/2020 0136   CREATININE 1.15 11/06/2020 0136   CALCIUM 8.1 (L) 11/06/2020 0136   GFRNONAA >60 11/06/2020 0136    INR No results found for: INR   Intake/Output Summary (Last 24 hours) at 11/06/2020 0745 Last data filed at 11/05/2020 2002 Gross per 24 hour  Intake --  Output 700 ml  Net -700 ml     Assessment/Plan:  55 y.o. male  With R foot wound  R foot wound; continue current wound care Plan is for RLE arteriogram with possible intervention tomorrow with Dr. Myra Gianotti All questions answered and willing to proceed NPO past midnight Consent   Emilie Rutter, PA-C [facility] [phone] 11/06/2020 7:45 AM  Agree with above.  Foot with no healing since Friday.  D/w pt agram possible intervention by Dr Myra Gianotti tomorrow.  Discussed procedure details with pt and his wife.  Fabienne Bruns, MD [facility] [place] Office: [phone]

## 2020-11-06 NOTE — Progress Notes (Signed)
Inpatient Diabetes Program Recommendations  AACE/ADA: New Consensus Statement on Inpatient Glycemic Control (2015)  Target Ranges:  Prepandial:   less than 140 mg/dL      Peak postprandial:   less than 180 mg/dL (1-2 hours)      Critically ill patients:  140 - 180 mg/dL   Lab Results  Component Value Date   GLUCAP 152 (H) 11/06/2020   HGBA1C 9.9 (H) 11/04/2020    Review of Glycemic Control  Diabetes history: DM 2 Outpatient Diabetes medications: Levemir 35 units, Farxiga 10 mg Daily, Metformin 1000 mg bid Current orders for Inpatient glycemic control:  Levemir 28 units qhs Novolog 0-15 units tid + hs  Inpatient Diabetes Program Recommendations:    Spoke with pt at bedside regarding Glucose control and A1c level of 9.9% on 3/19. Pt reports seeing his doctor every month. Last visit 2 weeks ago. Pt started [place] 1 month ago. A1c came down from 12% to a 9.9% from last check at PCP. Discussed getting A1c closer to 7%. Discussed importance of glucose control perioperatively. Pt has good follow up.  Pt not having a very good appetite here, discussed importance of good PO intake.  Thanks,  Christena Deem RN, MSN, BC-ADM Inpatient Diabetes Coordinator Team Pager [phone] (8a-5p)

## 2020-11-06 NOTE — Progress Notes (Signed)
PROGRESS NOTE   Roy Taylor  ZOX:[id number] DOB: [birth year] DOA: 11/04/2020 PCP: Renaldo Harrison, DO  Brief Narrative:  55 year old diabetic male dm ty ii x 10 yrs--poor control Diabetic neuropathy/nephropathy  with severe vascular disease?  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  Osteo-vascular consulted  +  ABI = occlusive disease  Transferred from [facility]  labs  Mild hyponatremia, hypocalcemia with trace bilirubinemia A1c 9.9 Lactic acid 1 WBC 14.3-cultures pending from 3/19  Hospital-Problem based course  Cellulitis/,myofasciitis R foot Angiogram scheduled 3/22 further  Zosyn clindamycin -saline 75 cc/H-3/19 blood cultures pending  Poorly controlled DM TY 2 A1c 9 On Levemir to 28 3 times daily + sliding scale coverage Holding Metformin at this time (angiogram in a.m.) Patient making good attempts at eating more healthy (had salad today) Hypokalemia P.o. twice daily K. Dur 40, a.m. mag follow a.m. labs BMI 42 Outpatient discussion with PCP    DVT prophylaxis: Heparin Code Status: Full Family Communication: Discussed with patient's sister at the bedside Disposition:  Status is: Inpatient  Remains inpatient appropriate because:Persistent severe electrolyte disturbances, Unsafe d/c plan and Inpatient level of care appropriate due to severity of illness   Dispo: The patient is from: Home              Anticipated d/c is to: Unclear at this time need to see how he does with mobility postoperatively              Patient currently is not medically stable to d/c.   Difficult to place patient No     Consultants:   Vascular surgeon  Procedures:   Antimicrobials:     Subjective:  Pain 7/10 Otherwise looks well Sitting in chair has reinforce dressing No chest pain no fever No stool today  Objective: Vitals:   11/05/20 1347 11/05/20 2001 11/06/20 0521 11/06/20 1240  BP: (!) 157/86 (!) 152/84 (!) 153/95  (!) 152/85  Pulse: (!) 106 (!) 108 84 90  Resp: 18 20 17 20   Temp: 98.3 F (36.8 C)  (!) 97.5 F (36.4 C) (!) 97.3 F (36.3 C)  TempSrc:   Oral Oral  SpO2: 96% 93% 94% 99%  Weight:      Height:        Intake/Output Summary (Last 24 hours) at 11/06/2020 1521 Last data filed at 11/06/2020 1300 Gross per 24 hour  Intake 480 ml  Output 1050 ml  Net -570 ml   Filed Weights   11/04/20 0408  Weight: (!) 155 kg    Examination: EOMI NCAT thick neck Mallampati 4 no icterus no pallor S1-S2 no murmur Chest clear no rales Abdomen soft no rebound no guarding Neurologically intact no focal deficit Psych euthymic   Data Reviewed: personally reviewed   CBC    Component Value Date/Time   WBC 14.1 (H) 11/06/2020 0136   RBC 3.88 (L) 11/06/2020 0136   HGB 10.4 (L) 11/06/2020 0136   HCT 31.5 (L) 11/06/2020 0136   PLT 445 (H) 11/06/2020 0136   MCV 81.2 11/06/2020 0136   MCH 26.8 11/06/2020 0136   MCHC 33.0 11/06/2020 0136   RDW 13.0 11/06/2020 0136   LYMPHSABS 1.7 11/06/2020 0136   MONOABS 1.2 (H) 11/06/2020 0136   EOSABS 0.1 11/06/2020 0136   BASOSABS 0.1 11/06/2020 0136   CMP Latest Ref Rng & Units 11/06/2020 11/04/2020  Glucose 70 - 99 mg/dL 11/06/2020) 811(B)  BUN 6 - 20 mg/dL 10 11  Creatinine 147(W -  1.24 mg/dL 0.01 7.49  Sodium 449 - 145 mmol/L 132(L) 132(L)  Potassium 3.5 - 5.1 mmol/L 2.9(L) 3.7  Chloride 98 - 111 mmol/L 99 100  CO2 22 - 32 mmol/L 25 23  Calcium 8.9 - 10.3 mg/dL 8.1(L) 8.1(L)  Total Protein 6.5 - 8.1 g/dL 6.7(R) 6.0(L)  Total Bilirubin 0.3 - 1.2 mg/dL 0.8 9.1(M)  Alkaline Phos 38 - 126 U/L 117 81  AST 15 - 41 U/L 32 24  ALT 0 - 44 U/L 28 23     Radiology Studies: No results found.   Scheduled Meds: . feeding supplement (PROSource TF)  45 mL Per Tube BID  . heparin  5,000 Units Subcutaneous Q8H  . ibuprofen  600 mg Oral QID  . insulin aspart  0-15 Units Subcutaneous TID WC  . insulin aspart  0-5 Units Subcutaneous QHS  . insulin detemir  28 Units  Subcutaneous QHS  . lisinopril  40 mg Oral Daily  . potassium chloride  40 mEq Oral BID   Continuous Infusions: . sodium chloride 75 mL/hr at 11/05/20 0421  . clindamycin (CLEOCIN) IV 600 mg (11/06/20 0602)  . piperacillin-tazobactam (ZOSYN)  IV 3.375 g (11/06/20 0602)     LOS: 2 days   Time spent: 30 minutes  Rhetta Mura, MD [facility] To contact the attending provider between 7A-7P or the covering provider during after hours 7P-7A, please log into the web site [email or web address] and access using universal [place] password for that web site. If you do not have the password, please call the hospital operator.  11/06/2020, 3:21 PM

## 2020-11-07 ENCOUNTER — Encounter (HOSPITAL_COMMUNITY)
Admission: AD | Disposition: A | Discharge status: Home / Self Care | Payer: Self-pay | Source: Other Acute Inpatient Hospital | Attending: Family Medicine

## 2020-11-07 HISTORY — PX: ABDOMINAL AORTOGRAM W/LOWER EXTREMITY: CATH118223

## 2020-11-07 HISTORY — PX: PERIPHERAL VASCULAR BALLOON ANGIOPLASTY: CATH118281

## 2020-11-07 LAB — POCT ACTIVATED CLOTTING TIME
Activated Clotting Time: 237 seconds
Activated Clotting Time: 208 seconds
Activated Clotting Time: 190 seconds

## 2020-11-07 LAB — COMPREHENSIVE METABOLIC PANEL WITH GFR
ALT: 35 U/L (ref 0–44)
AST: 40 U/L (ref 15–41)
Albumin: 1.8 g/dL — ABNORMAL LOW (ref 3.5–5.0)
Alkaline Phosphatase: 136 U/L — ABNORMAL HIGH (ref 38–126)
Anion gap: 7 (ref 5–15)
BUN: 14 mg/dL (ref 6–20)
CO2: 26 mmol/L (ref 22–32)
Calcium: 8.2 mg/dL — ABNORMAL LOW (ref 8.9–10.3)
Chloride: 102 mmol/L (ref 98–111)
Creatinine, Ser: 1.02 mg/dL (ref 0.61–1.24)
GFR, Estimated: >60 mL/min
Glucose, Bld: 158 mg/dL — ABNORMAL HIGH (ref 70–99)
Potassium: 3.8 mmol/L (ref 3.5–5.1)
Sodium: 135 mmol/L (ref 135–145)
Total Bilirubin: 0.6 mg/dL (ref 0.3–1.2)
Total Protein: 5.9 g/dL — ABNORMAL LOW (ref 6.5–8.1)

## 2020-11-07 LAB — GLUCOSE, CAPILLARY
Glucose-Capillary: 112 mg/dL — ABNORMAL HIGH (ref 70–99)
Glucose-Capillary: 102 mg/dL — ABNORMAL HIGH (ref 70–99)
Glucose-Capillary: 84 mg/dL (ref 70–99)
Glucose-Capillary: 86 mg/dL (ref 70–99)
Glucose-Capillary: 121 mg/dL — ABNORMAL HIGH (ref 70–99)

## 2020-11-07 LAB — MAGNESIUM: Magnesium: 2.1 mg/dL (ref 1.7–2.4)

## 2020-11-07 SURGERY — ABDOMINAL AORTOGRAM W/LOWER EXTREMITY
Anesthesia: LOCAL | Laterality: Right

## 2020-11-07 MED ORDER — IOPAMIDOL (ISOVUE-370) INJECTION 76%
INTRAVENOUS | Status: DC | PRN
  Administered 2020-11-07: 180 mL via INTRA_ARTERIAL

## 2020-11-07 MED ORDER — HEPARIN (PORCINE) IN NACL 1000-0.9 UT/500ML-% IV SOLN
INTRAVENOUS | Status: AC
  Filled 2020-11-07: qty 1000

## 2020-11-07 MED ORDER — OXYCODONE HCL 5 MG PO TABS
5.0000 mg | ORAL_TABLET | ORAL | Status: DC | PRN
  Administered 2020-11-09 (×2): 10 mg via ORAL
  Filled 2020-11-07 (×2): qty 2

## 2020-11-07 MED ORDER — LABETALOL HCL 5 MG/ML IV SOLN
INTRAVENOUS | Status: AC
  Filled 2020-11-07: qty 4

## 2020-11-07 MED ORDER — MORPHINE SULFATE (PF) 2 MG/ML IV SOLN
2.0000 mg | INTRAVENOUS | Status: DC | PRN
  Administered 2020-11-07 – 2020-11-08 (×2): 2 mg via INTRAVENOUS
  Filled 2020-11-07: qty 1

## 2020-11-07 MED ORDER — SODIUM CHLORIDE 0.9 % WEIGHT BASED INFUSION
1.0000 mL/kg/h | INTRAVENOUS | Status: AC
  Administered 2020-11-07: 1 mL/kg/h via INTRAVENOUS

## 2020-11-07 MED ORDER — FENTANYL CITRATE (PF) 100 MCG/2ML IJ SOLN
INTRAMUSCULAR | Status: AC
  Filled 2020-11-07: qty 2

## 2020-11-07 MED ORDER — MIDAZOLAM HCL 2 MG/2ML IJ SOLN
INTRAMUSCULAR | Status: AC
  Filled 2020-11-07: qty 2

## 2020-11-07 MED ORDER — SODIUM CHLORIDE 0.9% FLUSH
3.0000 mL | Freq: Two times a day (BID) | INTRAVENOUS | Status: DC
  Administered 2020-11-08 – 2020-11-09 (×2): 3 mL via INTRAVENOUS

## 2020-11-07 MED ORDER — ONDANSETRON HCL 4 MG/2ML IJ SOLN: 4.0000 mg | Freq: Four times a day (QID) | INTRAMUSCULAR | Status: DC | PRN

## 2020-11-07 MED ORDER — HEPARIN SODIUM (PORCINE) 1000 UNIT/ML IJ SOLN
INTRAMUSCULAR | Status: DC | PRN
  Administered 2020-11-07: 12000 [IU] via INTRAVENOUS

## 2020-11-07 MED ORDER — ASPIRIN EC 81 MG PO TBEC
81.0000 mg | DELAYED_RELEASE_TABLET | Freq: Every day | ORAL | Status: DC
  Administered 2020-11-07 – 2020-11-09 (×3): 81 mg via ORAL
  Filled 2020-11-07 (×3): qty 1

## 2020-11-07 MED ORDER — LIDOCAINE HCL (PF) 1 % IJ SOLN
INTRAMUSCULAR | Status: AC
  Filled 2020-11-07: qty 30

## 2020-11-07 MED ORDER — HEPARIN (PORCINE) IN NACL 1000-0.9 UT/500ML-% IV SOLN
INTRAVENOUS | Status: DC | PRN
  Administered 2020-11-07 (×2): 500 mL

## 2020-11-07 MED ORDER — SODIUM CHLORIDE 0.9 % IV SOLN
250.0000 mL | INTRAVENOUS | Status: DC | PRN
  Administered 2020-11-08: 250 mL via INTRAVENOUS

## 2020-11-07 MED ORDER — ACETAMINOPHEN 325 MG PO TABS: 650.0000 mg | ORAL_TABLET | ORAL | Status: DC | PRN

## 2020-11-07 MED ORDER — HYDRALAZINE HCL 20 MG/ML IJ SOLN
5.0000 mg | INTRAMUSCULAR | Status: AC | PRN
  Administered 2020-11-07 (×2): 5 mg via INTRAVENOUS

## 2020-11-07 MED ORDER — HEPARIN SODIUM (PORCINE) 1000 UNIT/ML IJ SOLN
INTRAMUSCULAR | Status: AC
  Filled 2020-11-07: qty 2

## 2020-11-07 MED ORDER — LIDOCAINE HCL (PF) 1 % IJ SOLN
INTRAMUSCULAR | Status: DC | PRN
  Administered 2020-11-07: 30 mL via INTRADERMAL

## 2020-11-07 MED ORDER — MIDAZOLAM HCL 2 MG/2ML IJ SOLN
INTRAMUSCULAR | Status: DC | PRN
  Administered 2020-11-07: 2 mg via INTRAVENOUS
  Administered 2020-11-07: 1 mg via INTRAVENOUS
  Administered 2020-11-07: 2 mg via INTRAVENOUS
  Administered 2020-11-07: 1 mg via INTRAVENOUS

## 2020-11-07 MED ORDER — FENTANYL CITRATE (PF) 100 MCG/2ML IJ SOLN
INTRAMUSCULAR | Status: DC | PRN
  Administered 2020-11-07 (×2): 50 ug via INTRAVENOUS
  Administered 2020-11-07 (×2): 25 ug via INTRAVENOUS

## 2020-11-07 MED ORDER — ROSUVASTATIN CALCIUM 5 MG PO TABS
5.0000 mg | ORAL_TABLET | Freq: Every day | ORAL | Status: DC
  Administered 2020-11-07 – 2020-11-09 (×3): 5 mg via ORAL
  Filled 2020-11-07 (×3): qty 1

## 2020-11-07 MED ORDER — LABETALOL HCL 5 MG/ML IV SOLN
10.0000 mg | INTRAVENOUS | Status: DC | PRN
  Administered 2020-11-07 (×3): 10 mg via INTRAVENOUS

## 2020-11-07 MED ORDER — SODIUM CHLORIDE 0.9% FLUSH: 3.0000 mL | INTRAVENOUS | Status: DC | PRN

## 2020-11-07 MED ORDER — HYDRALAZINE HCL 20 MG/ML IJ SOLN
INTRAMUSCULAR | Status: AC
  Filled 2020-11-07: qty 1

## 2020-11-07 SURGICAL SUPPLY — 28 items
BALLN STERLING OTW 3X100X150 (BALLOONS) ×3
BALLOON STERLING OTW 3X100X150 (BALLOONS) ×2 IMPLANT
CATH NAVICROSS ANGLED 135CM (MICROCATHETER) ×3 IMPLANT
CATH NAVICROSS ST 65CM (CATHETERS) ×2 IMPLANT
CATH OMNI FLUSH 5F 65CM (CATHETERS) ×3 IMPLANT
CATH QUICKCROSS .035X135CM (MICROCATHETER) ×3 IMPLANT
CATH SOFTOUCH MOTARJEME 5F (CATHETERS) ×3 IMPLANT
CATHETER NAVICROSS ST 65CM (CATHETERS) ×3
CLOSURE MYNX CONTROL 5F (Vascular Products) ×3 IMPLANT
DEVICE TORQUE H2O (MISCELLANEOUS) ×3 IMPLANT
GLIDEWIRE ADV .035X260CM (WIRE) ×3 IMPLANT
GLIDEWIRE ANGLED SS 035X260CM (WIRE) ×3 IMPLANT
GUIDEWIRE ANGLED .035X150CM (WIRE) ×3 IMPLANT
GUIDEWIRE ANGLED .035X260CM (WIRE) ×3 IMPLANT
KIT ENCORE 26 ADVANTAGE (KITS) ×3 IMPLANT
KIT MICROPUNCTURE NIT STIFF (SHEATH) ×3 IMPLANT
KIT PV (KITS) ×3 IMPLANT
MAT PREVALON FULL STRYKER (MISCELLANEOUS) ×3 IMPLANT
SHEATH FLEX ANSEL ANG 5F 45CM (SHEATH) ×3 IMPLANT
SHEATH PINNACLE 5F 10CM (SHEATH) ×6 IMPLANT
SHEATH PROBE COVER 6X72 (BAG) ×3 IMPLANT
SHIELD RADPAD SCOOP 12X17 (MISCELLANEOUS) ×3 IMPLANT
SYR MEDRAD MARK V 150ML (SYRINGE) ×3 IMPLANT
TRANSDUCER W/STOPCOCK (MISCELLANEOUS) ×3 IMPLANT
TRAY PV CATH (CUSTOM PROCEDURE TRAY) ×3 IMPLANT
WIRE BENTSON .035X145CM (WIRE) ×3 IMPLANT
WIRE G V18X300CM (WIRE) ×6 IMPLANT
WIRE HI TORQ VERSACORE 300 (WIRE) ×3 IMPLANT

## 2020-11-07 NOTE — Progress Notes (Signed)
SITE AREA: right femoral/groin  SITE PRIOR TO REMOVAL:  LEVEL 0  PRESSURE APPLIED FOR: approximately 20 minutes by Melvenia Beam tech  MANUAL: yes  PATIENT STATUS DURING PULL: wnl  POST PULL SITE:  LEVEL 0  POST PULL INSTRUCTIONS GIVEN: yes  POST PULL PULSES PRESENT:  Right anterior tibial pulse (above dressing) and left pedal pulse dopplerable  DRESSING APPLIED: gauze with tegaderm  BEDREST BEGINS @ 1845  COMMENTS:

## 2020-11-07 NOTE — Progress Notes (Signed)
Pain managed with prn percocet, has remained NPO since midnight, taken to procedure at 1230.

## 2020-11-07 NOTE — Progress Notes (Signed)
PROGRESS NOTE   Kaisyn Millea  JZP:[id number] DOB: [birth year] DOA: 11/04/2020 PCP: Renaldo Harrison, DO  Brief Narrative:  55 year old diabetic male dm ty ii x 10 yrs--poor control Diabetic neuropathy/nephropathy  with severe vascular disease?  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  Osteo-vascular consulted  +  ABI = occlusive disease  Transferred from [facility]  labs  Mild hyponatremia, hypocalcemia with trace bilirubinemia A1c 9.9 Lactic acid 1 WBC 14.3-cultures pending from 3/19  Hospital-Problem based course  Cellulitis/,myofasciitis R foot Angiogram scheduled 3/22 further plan as per vascular surgery Dr. Myra Gianotti Continue Zosyn clindamycin -saline 75 cc/H-3/19 blood cultures pending  Discontinue clindamycin if not planning amputation otherwise narrow to probably Augmentin in the a.m. Poorly controlled DM TY 2 A1c 9 On Levemir to 28 3 times daily + sliding scale coverage-CBG 84-1 02 Holding Metformin at this time (angiogram in a.m.) Patient making good attempts at eating more healthy (had salad today) Hypokalemia Replaced an acceptable mag acceptable BMI 42 Outpatient discussion with PCP    DVT prophylaxis: Heparin Code Status: Full Family Communication: Discussed with patient's sister at the bedside Disposition:  Status is: Inpatient  Remains inpatient appropriate because:Persistent severe electrolyte disturbances, Unsafe d/c plan and Inpatient level of care appropriate due to severity of illness   Dispo: The patient is from: Home              Anticipated d/c is to: Unclear at this time need to see how he does with mobility postoperatively              Patient currently is not medically stable to d/c.   Difficult to place patient No     Consultants:   Vascular surgeon  Procedures:   Antimicrobials:     Subjective:  Moderately controlled pain-seen prior to angiogram this  morning   Objective: Vitals:   11/07/20 1720 11/07/20 1725 11/07/20 1730 11/07/20 1735  BP: (!) 188/89 (!) 197/84 (!) 187/93 (!) 203/85  Pulse: 93 91 91 93  Resp: (!) 0 (!) 0 (!) 7 (!) 0  Temp:      TempSrc:      SpO2: 98% 100% 99% 99%  Weight:      Height:        Intake/Output Summary (Last 24 hours) at 11/07/2020 1743 Last data filed at 11/07/2020 0817 Gross per 24 hour  Intake 240 ml  Output 1625 ml  Net -1385 ml   Filed Weights   11/04/20 0408  Weight: (!) 155 kg    Examination: EOMI NCAT thick neck Mallampati 4 no icterus no pallor S1-S2 no murmur Chest clear no rales Abdomen soft no rebound no guarding Neurologically intact no focal deficit I did not examine his foot today or his wound Psych euthymic   Data Reviewed: personally reviewed   CBC    Component Value Date/Time   WBC 14.1 (H) 11/06/2020 0136   RBC 3.88 (L) 11/06/2020 0136   HGB 10.4 (L) 11/06/2020 0136   HCT 31.5 (L) 11/06/2020 0136   PLT 445 (H) 11/06/2020 0136   MCV 81.2 11/06/2020 0136   MCH 26.8 11/06/2020 0136   MCHC 33.0 11/06/2020 0136   RDW 13.0 11/06/2020 0136   LYMPHSABS 1.7 11/06/2020 0136   MONOABS 1.2 (H) 11/06/2020 0136   EOSABS 0.1 11/06/2020 0136   BASOSABS 0.1 11/06/2020 0136   CMP Latest Ref Rng & Units 11/07/2020 11/06/2020 11/04/2020  Glucose 70 - 99 mg/dL 480(X) 655(V) 748(O)  BUN  6 - 20 mg/dL 14 10 11   Creatinine 0.61 - 1.24 mg/dL 4.97 0.26  Sodium 135 - 145 mmol/L 135 132(L) 132(L)  Potassium 3.5 - 5.1 mmol/L 3.8 2.9(L) 3.7  Chloride 98 - 111 mmol/L 102 99 100  CO2 22 - 32 mmol/L 26 25 23   Calcium 8.9 - 10.3 mg/dL 8.2(L) 8.1(L) 8.1(L)  Total Protein 6.5 - 8.1 g/dL 5.9(L) 5.8(L) 6.0(L)  Total Bilirubin 0.3 - 1.2 mg/dL 0.6 0.8 3.78)  Alkaline Phos 38 - 126 U/L 136(H) 117 81  AST 15 - 41 U/L 40 32 24  ALT 0 - 44 U/L 35 28 23    Room Radiology Studies: No results found.   Scheduled Meds: . [MAR Hold] feeding supplement (PROSource TF)  45 mL Per Tube BID   . [MAR Hold] heparin  5,000 Units Subcutaneous Q8H  . [MAR Hold] ibuprofen  600 mg Oral QID  . [MAR Hold] insulin aspart  0-15 Units Subcutaneous TID WC  . [MAR Hold] insulin aspart  0-5 Units Subcutaneous QHS  . [MAR Hold] insulin detemir  28 Units Subcutaneous QHS  . [MAR Hold] lisinopril  40 mg Oral Daily  . [MAR Hold] potassium chloride  40 mEq Oral BID   Continuous Infusions: . sodium chloride 1 mL/kg/hr (11/07/20 1631)  . [MAR Hold] clindamycin (CLEOCIN) IV 600 mg (11/07/20 0658)  . [MAR Hold] piperacillin-tazobactam (ZOSYN)  IV 3.375 g (11/07/20 0658)     LOS: 3 days   Time spent: 15 minutes  11/09/20, MD [facility] To contact the attending provider between 7A-7P or the covering provider during after hours 7P-7A, please log into the web site [email or web address] and access using universal [place] password for that web site. If you do not have the password, please call the hospital operator.  11/07/2020, 5:43 PM

## 2020-11-07 NOTE — Op Note (Signed)
Patient name: Roy Taylor MRN: [id number] DOB: Jan 18, 1966 Sex: male  11/07/2020 Pre-operative Diagnosis: Nonhealing right toe amputation Post-operative diagnosis:  Same Surgeon:  Durene Cal Procedure Performed:  1.  Ultrasound-guided access, left femoral artery  2.  Antegrade ultrasound-guided access, right femoral artery  3.  Abdominal aortogram  4.  Right lower extremity runoff  5.  Angioplasty, right peroneal artery  6.  Conscious sedation, 120 minutes  7.  Mynx closure device to left groin   Indications: This is a 55 year old gentleman with diabetes who has undergone a partial toe amputation which is not healing.  He comes in today for vascular evaluation and possible intervention  Procedure:  The patient was identified in the holding area and taken to room 8.  The patient was then placed supine on the table and prepped and draped in the usual sterile fashion.  A time out was called.  Conscious sedation was administered with the use of IV fentanyl and Versed under continuous physician and nurse monitoring.  Heart rate, blood pressure, and oxygen saturation were continuously monitored.  Total sedation time was 120 minutes.  Ultrasound was used to evaluate the left common femoral artery.  It was patent .  A digital ultrasound image was acquired.  A micropuncture needle was used to access the left common femoral artery under ultrasound guidance.  An 018 wire was advanced without resistance and a micropuncture sheath was placed.  The 018 wire was removed and a benson wire was placed.  The micropuncture sheath was exchanged for a 5 french sheath.  An omniflush catheter was advanced over the wire to the level of L-1.  An abdominal angiogram was obtained.  Next, using the omniflush catheter and a benson wire, the aortic bifurcation was crossed and the catheter was placed into theright external iliac artery and right runoff was obtained.    Findings:   Aortogram: No significant renal artery  stenosis.  The infrarenal abdominal aorta is widely patent.  Bilateral common and external iliac arteries are patent without stenosis  Right Lower Extremity: The right common femoral profundofemoral, superficial femoral, and popliteal artery are widely patent without stenosis.  The anterior tibial and posterior tibial arteries are occluded at their origin.  There is reconstitution of diseased anterior tibial and posterior tibial artery below the ankle.  The peroneal artery is proximally patent with a high-grade stenosis.  There is a 10 cm occlusion of the peroneal artery  Left Lower Extremity: Not evaluated  Intervention: After the above images were acquired the decision was made to proceed with intervention.  I tried multiple catheters and wires to try to cross the aortic bifurcation however because of the stiffness of the bifurcation this was not possible.  I therefore elected to get antegrade access in the right common femoral artery.  This was done under ultrasound guidance with a micropuncture needle.  A 5 French 45 cm sheath was then inserted.  I used a V-18 wire and a 3 x 100 Sterling balloon as a support catheter.  I was able to navigate the wire into the peroneal artery.  I performed balloon angioplasty of the high-grade stenosis at the origin of the peroneal artery.  I tried to cross the occlusion with multiple different wires and catheters however ultimately this was unsuccessful.  The left groin was closed with a minx.  Manual pressure will be used for closure of the antegrade access  Impression:  #1  No significant inflow or outflow disease on the right  leg.  #2  Angioplasty of the proximal peroneal artery was performed with a 3 mm balloon however I was unable to cross the occlusion within the peroneal artery.  #3  Consideration for pedal access for revascularization however the patient is at high risk for proximal amputation    V. Durene Cal, M.D., [facility]  [place] Office: [phone] Pager:  [phone]

## 2020-11-07 NOTE — Progress Notes (Signed)
Pharmacy Antibiotic Note  Roy Taylor is a 55 y.o. male admitted on 11/04/2020 with cellulitis of R foot without osteomyelitis.  Pharmacy has been consulted for Zosyn dosing.  Patient has been on clindamycin and Zosyn, started at outside facility, since 3/16. Patient underwent right foot digit amputation on 3/17 followed by debridement on 3/18. Currently afebrile, WBC 14. Pending angiogram and possible VVS intervention today.   Plan: Zosyn 3.375g IV q8h (4 hour infusion).   clindamycin 600 mg IV Q8 hr until after angiogram per MD  Monitor cultures, clinical status, renal fx, and f/u duration   Height: 6' 3.5" (191.8 cm) Weight: (!) 155 kg (341 lb 11.4 oz) IBW/kg (Calculated) : 85.65  Temp (24hrs), Avg:97.8 F (36.6 C), Min:97.3 F (36.3 C), Max:98.1 F (36.7 C)  Recent Labs  Lab 11/04/20 0821 11/04/20 1037 11/06/20 0136 11/07/20 0305  WBC 14.3*  --  14.1*  --   CREATININE 0.94  --  1.15 1.02  LATICACIDVEN 1.0 1.1  --   --     Estimated Creatinine Clearance: 132.8 mL/min (by C-G formula based on SCr of 1.02 mg/dL).    No Known Allergies  Antimicrobials this admission: Clindamycin 3/16 >>3/22  Zosyn 3/16 >>  Microbiology results: 3/19 Bcx ngtd   Alphia Moh, PharmD, BCPS, [facility] Clinical Pharmacist  Please check AMION for all [facility] Pharmacy phone numbers After 10:00 PM, call Main Pharmacy [phone]

## 2020-11-08 ENCOUNTER — Other Ambulatory Visit: Payer: Self-pay

## 2020-11-08 ENCOUNTER — Encounter (HOSPITAL_COMMUNITY): Payer: Self-pay | Admitting: Surgery

## 2020-11-08 LAB — COMPREHENSIVE METABOLIC PANEL
ALT: 30 U/L (ref 0–44)
AST: 37 U/L (ref 15–41)
Albumin: 1.8 g/dL — ABNORMAL LOW (ref 3.5–5.0)
Alkaline Phosphatase: 123 U/L (ref 38–126)
Anion gap: 7 (ref 5–15)
BUN: 9 mg/dL (ref 6–20)
CO2: 23 mmol/L (ref 22–32)
Calcium: 8.1 mg/dL — ABNORMAL LOW (ref 8.9–10.3)
Chloride: 104 mmol/L (ref 98–111)
Creatinine, Ser: 0.77 mg/dL (ref 0.61–1.24)
GFR, Estimated: >60 mL/min (ref >60)
Glucose, Bld: 118 mg/dL — ABNORMAL HIGH (ref 70–99)
Potassium: 3.8 mmol/L (ref 3.5–5.1)
Sodium: 134 mmol/L — ABNORMAL LOW (ref 135–145)
Total Bilirubin: 0.8 mg/dL (ref 0.3–1.2)
Total Protein: 5.9 g/dL — ABNORMAL LOW (ref 6.5–8.1)

## 2020-11-08 LAB — CBC
HCT: 29.9 % — ABNORMAL LOW (ref 39.0–52.0)
Hemoglobin: 10.2 g/dL — ABNORMAL LOW (ref 13.0–17.0)
MCH: 27.2 pg (ref 26.0–34.0)
MCHC: 34.1 g/dL (ref 30.0–36.0)
MCV: 79.7 fL — ABNORMAL LOW (ref 80.0–100.0)
Platelets: 625 10*3/uL — ABNORMAL HIGH (ref 150–400)
RBC: 3.75 MIL/uL — ABNORMAL LOW (ref 4.22–5.81)
RDW: 13.2 % (ref 11.5–15.5)
WBC: 12.4 10*3/uL — ABNORMAL HIGH (ref 4.0–10.5)
nRBC: 0 % (ref 0.0–0.2)

## 2020-11-08 LAB — GLUCOSE, CAPILLARY
Glucose-Capillary: 159 mg/dL — ABNORMAL HIGH (ref 70–99)
Glucose-Capillary: 91 mg/dL (ref 70–99)
Glucose-Capillary: 156 mg/dL — ABNORMAL HIGH (ref 70–99)
Glucose-Capillary: 144 mg/dL — ABNORMAL HIGH (ref 70–99)

## 2020-11-08 MED ORDER — CARVEDILOL 6.25 MG PO TABS: 6.2500 mg | ORAL_TABLET | Freq: Two times a day (BID) | ORAL | Status: DC

## 2020-11-08 MED ORDER — DULOXETINE HCL 30 MG PO CPEP
30.0000 mg | ORAL_CAPSULE | Freq: Every day | ORAL | Status: DC
  Administered 2020-11-08 – 2020-11-09 (×2): 30 mg via ORAL
  Filled 2020-11-08 (×2): qty 1

## 2020-11-08 MED ORDER — CARVEDILOL 3.125 MG PO TABS
3.1250 mg | ORAL_TABLET | Freq: Two times a day (BID) | ORAL | Status: DC
  Administered 2020-11-08 – 2020-11-09 (×3): 3.125 mg via ORAL
  Filled 2020-11-08 (×3): qty 1

## 2020-11-08 NOTE — Progress Notes (Signed)
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 [facility] on 11/01/2020 with right foot swelling and pain for past month along with ulcer on his right pinky toe that had been draining, noted to have a dry gangrene of the fifth digit on his right foot MRI showed cellulitis involving the toes and forefoot and myositis without septic arthritis or osteomyelitis treated with clindamycin and Zosyn underwent fifth ray amputation on 3/17 and debridement bilateral ankle brachial indices shows concerning feature of occlusive disease and [facility] and seen by vascular surgery. Status post peroneal PTA 3/22 that showed severely compromised runoff and vascular surgery felt that his capacity for healing the toe amp is minimal, vascular surgery planning for right BKA on 3/28.  Subjective: Seen and examined this morning.  Blood pressure has been running high. no pain on leg He reports he is walking with walker in room normally   Assessment & Plan:  Cellulitis/myofascitis of right foot in the setting of severely compromised runoff vascular flow status post peroneal PTA.  Outpatient vascular surgery input they are planning for amputation coming Monday, continue on current IV antibiotics hopefully can change to oral Augmentin for discharge tomorrow with plan to return for amputation right BKA on Monday.  Leukocytosis downtrending.  Peripheral artery disease see above status post Rt peroneal PTA.  Continue aspirin and statin  Type 2 diabetes mellitus insulin-dependent uncontrolled hemoglobin A1c 9.  Blood sugar fairly stable, continue current Lantus 28 units nightly and sliding scale. Recent Labs  Lab 11/07/20 1508  11/07/20 1621 11/07/20 2113 11/08/20 0600 11/08/20 1104  GLUCAP 84 86 121* 159* 91   Hypertension blood pressure has been poorly controlled on lisinopril 40 mg.  Initiating Coreg 3.125 can slowly uptitrate.  Morbid obesity BMI 42, will benefit with weight loss and PCP follow-up.  Anemia of chronic disease hemoglobin is stable and tenogram range.  Monitor  Thrombocytosis likely in the setting of infection reactive  Diet Order            Diet regular Room service appropriate? Yes; Fluid consistency: Thin  Diet effective now                 Nutrition Problem: Increased nutrient needs Etiology: wound healing Signs/Symptoms: estimated needs Interventions: MVI,Prostat Patient's Body mass index is 42.15 kg/m.  DVT prophylaxis: heparin injection 5,000 Units Start: 11/04/20 0815 Code Status:   Code Status: Full Code  Family Communication: plan of care discussed with patient at bedside.  Called his wife and left a message for call back. Status is: Inpatient  Remains inpatient appropriate because:IV treatments appropriate due to intensity of illness or inability to take PO and Inpatient level of care appropriate due to severity of illness   Dispo: The patient is from: Home              Anticipated d/c is to: Home tomorrow              Patient currently is not medically stable to d/c.   Difficult to place patient No Unresulted Labs (From admission, onward)          Start     Ordered   11/07/20 0500  Comprehensive metabolic panel  Daily,   R      11/06/20 1525  Medications reviewed:  Scheduled Meds: . aspirin EC  81 mg Oral Daily  . carvedilol  6.25 mg Oral BID WC  . DULoxetine  30 mg Oral Daily  . feeding supplement (PROSource TF)  45 mL Per Tube BID  . heparin  5,000 Units Subcutaneous Q8H  . ibuprofen  600 mg Oral QID  . insulin aspart  0-15 Units Subcutaneous TID WC  . insulin aspart  0-5 Units Subcutaneous QHS  . insulin detemir  28 Units Subcutaneous QHS   . lisinopril  40 mg Oral Daily  . potassium chloride  40 mEq Oral BID  . rosuvastatin  5 mg Oral Daily  . sodium chloride flush  3 mL Intravenous Q12H   Continuous Infusions: . sodium chloride 250 mL (11/08/20 0546)  . piperacillin-tazobactam (ZOSYN)  IV 3.375 g (11/08/20 0540)    Consultants:see note  Procedures:see note  Antimicrobials: Anti-infectives (From admission, onward)   Start     Dose/Rate Route Frequency Ordered Stop   11/04/20 0845  piperacillin-tazobactam (ZOSYN) IVPB 3.375 g        3.375 g 12.5 mL/hr over 240 Minutes Intravenous Every 8 hours 11/04/20 0758     11/04/20 0815  clindamycin (CLEOCIN) IVPB 600 mg        600 mg 100 mL/hr over 30 Minutes Intravenous Every 8 hours 11/04/20 0723 11/07/20 2159     Culture/Microbiology    Component Value Date/Time   SDES BLOOD RIGHT HAND 11/04/2020 0821   SPECREQUEST  11/04/2020 7829    BOTTLES DRAWN AEROBIC AND ANAEROBIC Blood Culture results may not be optimal due to an inadequate volume of blood received in culture bottles   CULT  11/04/2020 0821    NO GROWTH 4 DAYS Performed at [facility], [address], [place], [place] [address]    REPTSTATUS PENDING 11/04/2020 0865    Other culture-see note  Objective: Vitals: Today's Vitals   11/08/20 0600 11/08/20 0758 11/08/20 0835 11/08/20 0909  BP: (!) 176/106 (!) 187/107    Pulse:  100    Resp: 16 17    Temp:  97.9 F (36.6 C)    TempSrc:  Oral    SpO2:  98%    Weight:      Height:      PainSc: 0-No pain  9  6     Intake/Output Summary (Last 24 hours) at 11/08/2020 0946 Last data filed at 11/08/2020 0839 Gross per 24 hour  Intake 2177.13 ml  Output 865 ml  Net 1312.13 ml   Filed Weights   11/04/20 0408  Weight: (!) 155 kg   Weight change:   Intake/Output from previous day: 03/22 0701 - 03/23 0700 In: 1937.1 [P.O.:250; I.V.:1620; IV Piggyback:67.1] Out: 415 [Urine:415] Intake/Output this shift: Total I/O In: 240 [P.O.:240] Out: 450  [Urine:450] Filed Weights   11/04/20 0408  Weight: (!) 155 kg    Examination: General exam: AAOx3, obese ,NAD, weak appearing. HEENT:Oral mucosa moist, Ear/Nose WNL grossly,dentition normal. Respiratory system: bilaterally diminished,no use of accessory muscle, non tender. Cardiovascular system: S1 & S2 +, regular, No JVD. Gastrointestinal system: Abdomen soft, NT,ND, BS+. Nervous System:Alert, awake, moving extremities and grossly nonfocal Extremities: No edema, distal peripheral pulses palpable.  RLE foot in dressing Skin: No rashes,no icterus. MSK: Normal muscle bulk,tone, power  Data Reviewed: I have personally reviewed following labs and imaging studies CBC: Recent Labs  Lab 11/04/20 0821 11/06/20 0136 11/08/20 0145  WBC 14.3* 14.1* 12.4*  NEUTROABS  --  10.8*  --  HGB 10.9* 10.4* 10.2*  HCT 33.3* 31.5* 29.9*  MCV 81.2 81.2 79.7*  PLT 433* 445* 625*   Basic Metabolic Panel: Recent Labs  Lab 11/04/20 0821 11/06/20 0136 11/07/20 0305 11/08/20 0145  NA 132* 132* 135 134*  K 3.7 2.9* 3.8 3.8  CL 100 99 102 104  CO2 23 25 26 23   GLUCOSE 150* 213* 158* 118*  BUN 11 10 14 9   CREATININE 0.94 1.15 1.02 0.77  CALCIUM 8.1* 8.1* 8.2* 8.1*  MG  --   --  2.1  --    GFR: Estimated Creatinine Clearance: 169.3 mL/min (by C-G formula based on SCr of 0.77 mg/dL). Liver Function Tests: Recent Labs  Lab 11/04/20 0821 11/06/20 0136 11/07/20 0305 11/08/20 0145  AST 24 32 40 37  ALT 23 28 35 30  ALKPHOS 81 117 136* 123  BILITOT 1.3* 0.8 0.6 0.8  PROT 6.0* 5.8* 5.9* 5.9*  ALBUMIN 2.2* 1.7* 1.8* 1.8*   No results for input(s): LIPASE, AMYLASE in the last 168 hours. No results for input(s): AMMONIA in the last 168 hours. Coagulation Profile: No results for input(s): INR, PROTIME in the last 168 hours. Cardiac Enzymes: No results for input(s): CKTOTAL, CKMB, CKMBINDEX, TROPONINI in the last 168 hours. BNP (last 3 results) No results for input(s): PROBNP in the last  8760 hours. HbA1C: No results for input(s): HGBA1C in the last 72 hours. CBG: Recent Labs  Lab 11/07/20 1141 11/07/20 1508 11/07/20 1621 11/07/20 2113 11/08/20 0600  GLUCAP 102* 84 86 121* 159*   Lipid Profile: No results for input(s): CHOL, HDL, LDLCALC, TRIG, CHOLHDL, LDLDIRECT in the last 72 hours. Thyroid Function Tests: No results for input(s): TSH, T4TOTAL, FREET4, T3FREE, THYROIDAB in the last 72 hours. Anemia Panel: No results for input(s): VITAMINB12, FOLATE, FERRITIN, TIBC, IRON, RETICCTPCT in the last 72 hours. Sepsis Labs: Recent Labs  Lab 11/04/20 11/10/20 11/04/20 1037  LATICACIDVEN 1.0 1.1    Recent Results (from the past 240 hour(s))  Culture, blood (routine x 2)     Status: None (Preliminary result)   Collection Time: 11/04/20  8:20 AM   Specimen: BLOOD  Result Value Ref Range Status   Specimen Description BLOOD LEFT ANTECUBITAL  Final   Special Requests   Final    BOTTLES DRAWN AEROBIC AND ANAEROBIC Blood Culture results may not be optimal due to an inadequate volume of blood received in culture bottles   Culture   Final    NO GROWTH 4 DAYS Performed at [facility], [address] [place], [address] [place]    Report Status PENDING  Incomplete  Culture, blood (routine x 2)     Status: None (Preliminary result)   Collection Time: 11/04/20  8:21 AM   Specimen: BLOOD RIGHT HAND  Result Value Ref Range Status   Specimen Description BLOOD RIGHT HAND  Final   Special Requests   Final    BOTTLES DRAWN AEROBIC AND ANAEROBIC Blood Culture results may not be optimal due to an inadequate volume of blood received in culture bottles   Culture   Final    NO GROWTH 4 DAYS Performed at [facility], [address] [place], [address] [place]    Report Status PENDING  Incomplete     Radiology Studies: PERIPHERAL VASCULAR CATHETERIZATION  Result Date: 11/07/2020 Patient name: Arn Mcomber MRN: [id number] DOB: 1965-12-17 Sex: male 11/07/2020 Pre-operative  Diagnosis: Nonhealing right toe amputation Post-operative diagnosis:  Same Surgeon:  11/21/1965 Procedure Performed:  1.  Ultrasound-guided access, left femoral artery  2.  Antegrade ultrasound-guided access, right femoral artery  3.  Abdominal aortogram  4.  Right lower extremity runoff  5.  Angioplasty, right peroneal artery  6.  Conscious sedation, 120 minutes  7.  Mynx closure device to left groin Indications: This is a 55 year old gentleman with diabetes who has undergone a partial toe amputation which is not healing.  He comes in today for vascular evaluation and possible intervention Procedure:  The patient was identified in the holding area and taken to room 8.  The patient was then placed supine on the table and prepped and draped in the usual sterile fashion.  A time out was called.  Conscious sedation was administered with the use of IV fentanyl and Versed under continuous physician and nurse monitoring.  Heart rate, blood pressure, and oxygen saturation were continuously monitored.  Total sedation time was 120 minutes.  Ultrasound was used to evaluate the left common femoral artery.  It was patent .  A digital ultrasound image was acquired.  A micropuncture needle was used to access the left common femoral artery under ultrasound guidance.  An 018 wire was advanced without resistance and a micropuncture sheath was placed.  The 018 wire was removed and a benson wire was placed.  The micropuncture sheath was exchanged for a 5 french sheath.  An omniflush catheter was advanced over the wire to the level of L-1.  An abdominal angiogram was obtained.  Next, using the omniflush catheter and a benson wire, the aortic bifurcation was crossed and the catheter was placed into theright external iliac artery and right runoff was obtained.  Findings:  Aortogram: No significant renal artery stenosis.  The infrarenal abdominal aorta is widely patent.  Bilateral common and external iliac arteries are patent without  stenosis  Right Lower Extremity: The right common femoral profundofemoral, superficial femoral, and popliteal artery are widely patent without stenosis.  The anterior tibial and posterior tibial arteries are occluded at their origin.  There is reconstitution of diseased anterior tibial and posterior tibial artery below the ankle.  The peroneal artery is proximally patent with a high-grade stenosis.  There is a 10 cm occlusion of the peroneal artery  Left Lower Extremity: Not evaluated Intervention: After the above images were acquired the decision was made to proceed with intervention.  I tried multiple catheters and wires to try to cross the aortic bifurcation however because of the stiffness of the bifurcation this was not possible.  I therefore elected to get antegrade access in the right common femoral artery.  This was done under ultrasound guidance with a micropuncture needle.  A 5 French 45 cm sheath was then inserted.  I used a V-18 wire and a 3 x 100 Sterling balloon as a support catheter.  I was able to navigate the wire into the peroneal artery.  I performed balloon angioplasty of the high-grade stenosis at the origin of the peroneal artery.  I tried to cross the occlusion with multiple different wires and catheters however ultimately this was unsuccessful.  The left groin was closed with a minx.  Manual pressure will be used for closure of the antegrade access Impression:  #1  No significant inflow or outflow disease on the right leg.  #2  Angioplasty of the proximal peroneal artery was performed with a 3 mm balloon however I was unable to cross the occlusion within the peroneal artery.  #3  Consideration for pedal access for revascularization however  the patient is at high risk for proximal amputation V. Durene Cal, M.D., [facility] [place] Office: [phone] Pager:  [phone]    LOS: 4 days   Lanae Boast, MD [facility]  11/08/2020, 9:46 AM

## 2020-11-08 NOTE — H&P (View-Only) (Signed)
  Progress Note    11/08/2020 7:43 AM 1 Day Post-Op  Subjective: right foot pain. States he did not sleep well overnight. Expresses wishes to just proceed with more proximal amputation   Vitals:   11/08/20 0400 11/08/20 0600  BP: (!) 146/76 (!) 176/106  Pulse: (!) 103   Resp: 20 16  Temp:    SpO2: 98%    Physical Exam: Cardiac:  regular Lungs: non labored Extremities:  Right foot dressed. Left femoral access site clean, dry and intact without hematoma Neurologic: alert and oriented  CBC    Component Value Date/Time   WBC 12.4 (H) 11/08/2020 0145   RBC 3.75 (L) 11/08/2020 0145   HGB 10.2 (L) 11/08/2020 0145   HCT 29.9 (L) 11/08/2020 0145   PLT 625 (H) 11/08/2020 0145   MCV 79.7 (L) 11/08/2020 0145   MCH 27.2 11/08/2020 0145   MCHC 34.1 11/08/2020 0145   RDW 13.2 11/08/2020 0145   LYMPHSABS 1.7 11/06/2020 0136   MONOABS 1.2 (H) 11/06/2020 0136   EOSABS 0.1 11/06/2020 0136   BASOSABS 0.1 11/06/2020 0136    BMET    Component Value Date/Time   NA 134 (L) 11/08/2020 0145   K 3.8 11/08/2020 0145   CL 104 11/08/2020 0145   CO2 23 11/08/2020 0145   GLUCOSE 118 (H) 11/08/2020 0145   BUN 9 11/08/2020 0145   CREATININE 0.77 11/08/2020 0145   CALCIUM 8.1 (L) 11/08/2020 0145   GFRNONAA >60 11/08/2020 0145    INR No results found for: INR   Intake/Output Summary (Last 24 hours) at 11/08/2020 0743 Last data filed at 11/08/2020 0400 Gross per 24 hour  Intake 1937.13 ml  Output 415 ml  Net 1522.13 ml     Assessment/Plan:  55 y.o. male is s/p Abdominal aortogram, arteriogram RLE, angioplasty right peroneal artery 1 Day Post-Op. Patient with continued right foot pain.No discomfort left groin, access site clean, dry and intact without hematoma. Remains Hypertensive. Discussed possibility of retrograde access with further attempt at revascularization. Patient states he has already made up his mind and is tired of repeated procedures and the pain in right foot and wants to  have BKA vs AKA. Will relay this to Dr. Myra Gianotti and discuss timing of surgery  DVT prophylaxis:  Sq heparin   Graceann Congress, PA-C [facility] [phone] 11/08/2020 7:43 AM   Pt s/p peroneal PTA yesterday.  He has severely compromised runoff and I believe his capacity for healing the toe amp is minimal.  D/s pt right BKA on Monday 3/28. He has severe protein calorie malnutrition with albumin of 1.8 which makes his capacity for wound healing even more diminished.  From my standpoint he can go home and come back Monday.  Emphasized protein shakes such as glucerna to increase protein stores in preparation for amputation on Monday as biggest risk in this is 5-10 percent chance of BKA not healing eventually requiring AKA.  D/w pt BKA is his best chance for long term ambulation so we will start with this.  Fabienne Bruns, MD [facility] [place] Office: [phone]

## 2020-11-08 NOTE — Progress Notes (Addendum)
  Progress Note    11/08/2020 7:43 AM 1 Day Post-Op  Subjective: right foot pain. States he did not sleep well overnight. Expresses wishes to just proceed with more proximal amputation   Vitals:   11/08/20 0400 11/08/20 0600  BP: (!) 146/76 (!) 176/106  Pulse: (!) 103   Resp: 20 16  Temp:    SpO2: 98%    Physical Exam: Cardiac:  regular Lungs: non labored Extremities:  Right foot dressed. Left femoral access site clean, dry and intact without hematoma Neurologic: alert and oriented  CBC    Component Value Date/Time   WBC 12.4 (H) 11/08/2020 0145   RBC 3.75 (L) 11/08/2020 0145   HGB 10.2 (L) 11/08/2020 0145   HCT 29.9 (L) 11/08/2020 0145   PLT 625 (H) 11/08/2020 0145   MCV 79.7 (L) 11/08/2020 0145   MCH 27.2 11/08/2020 0145   MCHC 34.1 11/08/2020 0145   RDW 13.2 11/08/2020 0145   LYMPHSABS 1.7 11/06/2020 0136   MONOABS 1.2 (H) 11/06/2020 0136   EOSABS 0.1 11/06/2020 0136   BASOSABS 0.1 11/06/2020 0136    BMET    Component Value Date/Time   NA 134 (L) 11/08/2020 0145   K 3.8 11/08/2020 0145   CL 104 11/08/2020 0145   CO2 23 11/08/2020 0145   GLUCOSE 118 (H) 11/08/2020 0145   BUN 9 11/08/2020 0145   CREATININE 0.77 11/08/2020 0145   CALCIUM 8.1 (L) 11/08/2020 0145   GFRNONAA >60 11/08/2020 0145    INR No results found for: INR   Intake/Output Summary (Last 24 hours) at 11/08/2020 0743 Last data filed at 11/08/2020 0400 Gross per 24 hour  Intake 1937.13 ml  Output 415 ml  Net 1522.13 ml     Assessment/Plan:  55 y.o. male is s/p Abdominal aortogram, arteriogram RLE, angioplasty right peroneal artery 1 Day Post-Op. Patient with continued right foot pain.No discomfort left groin, access site clean, dry and intact without hematoma. Remains Hypertensive. Discussed possibility of retrograde access with further attempt at revascularization. Patient states he has already made up his mind and is tired of repeated procedures and the pain in right foot and wants to  have BKA vs AKA. Will relay this to Dr. Brabham and discuss timing of surgery  DVT prophylaxis:  Sq heparin   Corrina Baglia, PA-C [facility] [phone] 11/08/2020 7:43 AM   Pt s/p peroneal PTA yesterday.  He has severely compromised runoff and I believe his capacity for healing the toe amp is minimal.  D/s pt right BKA on Monday 3/28. He has severe protein calorie malnutrition with albumin of 1.8 which makes his capacity for wound healing even more diminished.  From my standpoint he can go home and come back Monday.  Emphasized protein shakes such as glucerna to increase protein stores in preparation for amputation on Monday as biggest risk in this is 5-10 percent chance of BKA not healing eventually requiring AKA.  D/w pt BKA is his best chance for long term ambulation so we will start with this.  Daryel Kenneth, MD [facility] [place] Office: [phone]  

## 2020-11-08 NOTE — Progress Notes (Addendum)
Last night upon admission, pt was in excruciating pain, asking for pain meds while staff was getting him situated. This RN went to pull morphine while his nurse was getting the pt settled. The order showed 1 mg. Morphine pulled, RN picked "waste later" option due to emergency. In the [facility]. Page was refreshed after meds verified, and the 1 mg order was discontinued, but there was a new available 2 mg order. This RN administered the full 2 mg. [facility] still asking for a waste.

## 2020-11-09 LAB — CULTURE, BLOOD (ROUTINE X 2)
Culture: NO GROWTH
Culture: NO GROWTH

## 2020-11-09 LAB — SARS CORONAVIRUS 2 (TAT 6-24 HRS): SARS Coronavirus 2: NEGATIVE

## 2020-11-09 LAB — COMPREHENSIVE METABOLIC PANEL
ALT: 32 U/L (ref 0–44)
AST: 36 U/L (ref 15–41)
Albumin: 1.8 g/dL — ABNORMAL LOW (ref 3.5–5.0)
Alkaline Phosphatase: 148 U/L — ABNORMAL HIGH (ref 38–126)
Anion gap: 5 (ref 5–15)
BUN: 10 mg/dL (ref 6–20)
CO2: 25 mmol/L (ref 22–32)
Calcium: 8.3 mg/dL — ABNORMAL LOW (ref 8.9–10.3)
Chloride: 105 mmol/L (ref 98–111)
Creatinine, Ser: 0.92 mg/dL (ref 0.61–1.24)
GFR, Estimated: >60 mL/min (ref >60)
Glucose, Bld: 116 mg/dL — ABNORMAL HIGH (ref 70–99)
Potassium: 4.5 mmol/L (ref 3.5–5.1)
Sodium: 135 mmol/L (ref 135–145)
Total Bilirubin: 0.7 mg/dL (ref 0.3–1.2)
Total Protein: 5.9 g/dL — ABNORMAL LOW (ref 6.5–8.1)

## 2020-11-09 LAB — GLUCOSE, CAPILLARY
Glucose-Capillary: 112 mg/dL — ABNORMAL HIGH (ref 70–99)
Glucose-Capillary: 99 mg/dL (ref 70–99)

## 2020-11-09 MED ORDER — ROSUVASTATIN CALCIUM 5 MG PO TABS: 5.0000 mg | ORAL_TABLET | Freq: Every day | ORAL | 0 refills | Status: DC

## 2020-11-09 MED ORDER — CARVEDILOL 6.25 MG PO TABS: 6.2500 mg | ORAL_TABLET | Freq: Two times a day (BID) | ORAL | 0 refills | Status: DC

## 2020-11-09 MED ORDER — ASPIRIN 81 MG PO TBEC: 81.0000 mg | DELAYED_RELEASE_TABLET | Freq: Every day | ORAL | 0 refills | Status: AC

## 2020-11-09 MED ORDER — IBUPROFEN 600 MG PO TABS: 600.0000 mg | ORAL_TABLET | Freq: Four times a day (QID) | ORAL | 0 refills | Status: DC | PRN

## 2020-11-09 MED ORDER — CARVEDILOL 6.25 MG PO TABS
6.2500 mg | ORAL_TABLET | Freq: Two times a day (BID) | ORAL | Status: DC
  Administered 2020-11-09: 6.25 mg via ORAL
  Filled 2020-11-09: qty 1

## 2020-11-09 MED ORDER — OXYCODONE HCL 5 MG PO TABS: 5.0000 mg | ORAL_TABLET | Freq: Four times a day (QID) | ORAL | 0 refills | Status: DC | PRN

## 2020-11-09 MED ORDER — AMOXICILLIN-POT CLAVULANATE 875-125 MG PO TABS: 1.0000 | ORAL_TABLET | Freq: Two times a day (BID) | ORAL | 0 refills | Status: DC

## 2020-11-09 NOTE — Discharge Summary (Signed)
Physician Discharge Summary  Branch Pacitti ZOX:[id number] DOB: 1966/02/08 DOA: 11/04/2020  PCP: Renaldo Harrison, DO  Admit date: 11/04/2020 Discharge date: 11/09/2020  Admitted From: home Disposition:  home  Recommendations for Outpatient Follow-up:  1. Follow up with Dr. Darrick Penna for return to hospital on Monday for amputation   Home Health:no  Equipment/Devices: none  Discharge Condition: Stable Code Status:   Code Status: Full Code Diet recommendation:  Diet Order            Diet Carb Modified           Diet regular Room service appropriate? 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 presented to [facility] ED on 11/01/2020 with right foot swelling and pain for past month along with ulcer on his right pinky toe that had been draining, noted to have a dry gangrene of the fifth digit on his right foot MRI showed cellulitis involving the toes and forefoot and myositis without septic arthritis or osteomyelitis treated with clindamycin and Zosyn underwent fifth ray amputation on 3/17 and debridement bilateral ankle brachial indices shows concerning feature of occlusive disease and transferred to [facility] and seen by vascular surgery. Status post peroneal PTA 3/22 that showed severely compromised runoff and vascular surgery felt that his capacity for healing the toe amp is minimal, vascular surgery planning for right BKA on 3/28. Vascular surgery has done Covid swab testing prior to discharge IN preparation for surgery on Monday. I discussed with Dr. Darrick Penna and okay to discharge home on oral Augmentin and his office will contact him for return to the hospital Monday morning for outpatient surgery.  Discharge Diagnoses:   Cellulitis/myofascitis of right foot in  the setting of severely compromised runoff vascular flow status post peroneal PTA.  Outpatient vascular surgery input they are planning for amputation coming Monday, and okay to discharge home on oral antibiotics pain medication.  He will be coming for BKA.  Peripheral artery disease see above status post Rt peroneal PTA.  Continue aspirin and statin  Type 2 diabetes mellitus insulin-dependent uncontrolled hemoglobin A1c 9.  Blood sugar fairly stable, continue Lantus can resume home dose slowly.  Check sugar 4 times a day at home.    Hypertension blood pressure not well controlled Coreg has been increased to 6.25 mg twice daily.  Continue lisinopril.  Monitor and follow-up with PCP.    Morbid obesity BMI 42, will benefit with weight loss and PCP follow-up.  Anemia of chronic disease hemoglobin is stable and tenogram range.  Monitor  Thrombocytosis likely in the setting of infection reactive  Consults:  Vascular surgery  Subjective: Alert awake oriented, not in acute distress.  Sister at the bedside and cellulitic about going home but after talking with the vascular patient feels okay going home today with plan for surgery on Monday.  Discharge Exam: Vitals:   11/09/20 0801 11/09/20 1217  BP: (!) 170/93 (!) 167/102  Pulse: 94 97  Resp: 20 20  Temp: 99.2 F (37.3 C) 98.6 F (37 C)  SpO2: 96% 97%   General: Pt is alert, awake, not in acute distress Cardiovascular: RRR, S1/S2 +, no rubs, no gallops Respiratory: CTA bilaterally, no wheezing, no rhonchi Abdominal: Soft, NT, ND, bowel sounds + Extremities: no edema, no cyanosis  Discharge Instructions  Discharge Instructions    Diet Carb Modified   Complete by: As directed    Discharge instructions   Complete by: As directed    Follow-up with Dr. Darrick PennaFields for amputation on 11/13/2020.  Contact the office OF Dr Darrick PennaFields on instruction for admission as outpatient for surgery on Monday morning    Please call call MD or return  to ER for similar or worsening recurring problem that brought you to hospital or if any fever,nausea/vomiting,abdominal pain, uncontrolled pain, chest pain,  shortness of breath or any other alarming symptoms.  Please follow-up your doctor as instructed in a week time and call the office for appointment.  Please avoid alcohol, smoking, or any other illicit substance and maintain healthy habits including taking your regular medications as prescribed.  You were cared for by a hospitalist during your hospital stay. If you have any questions about your discharge medications or the care you received while you were in the hospital after you are discharged, you can call the unit and ask to speak with the hospitalist on call if the hospitalist that took care of you is not available.  Once you are discharged, your primary care physician will handle any further medical issues. Please note that NO REFILLS for any discharge medications will be authorized once you are discharged, as it is imperative that you return to your primary care physician (or establish a relationship with a primary care physician if you do not have one) for your aftercare needs so that they can reassess your need for medications and monitor your lab values   Discharge wound care:   Complete by: As directed    Dress wound daily   Increase activity slowly   Complete by: As directed      Allergies as of 11/09/2020   No Known Allergies     Medication List    TAKE these medications   amoxicillin-clavulanate 875-125 MG tablet Commonly known as: Augmentin Take 1 tablet by mouth 2 (two) times daily for 7 days.   aspirin 81 MG EC tablet Take 1 tablet (81 mg total) by mouth daily. Swallow whole.   carvedilol 6.25 MG tablet Commonly known as: COREG Take 1 tablet (6.25 mg total) by mouth 2 (two) times daily with a meal.   dapagliflozin propanediol 10 MG Tabs tablet Commonly known as: FARXIGA Take 10 mg by mouth daily.   DULoxetine  30 MG capsule Commonly known as: CYMBALTA Take 30 mg by mouth daily.   ibuprofen 600 MG tablet Commonly known as: ADVIL Take 1 tablet (600 mg total) by mouth every 6 (six) hours as needed for up to 30 doses for moderate pain.   insulin detemir 100 UNIT/ML injection Commonly known as: LEVEMIR Inject 35 Units into the skin at bedtime.   lisinopril 40 MG tablet Commonly known as: ZESTRIL Take 40 mg by mouth daily.   metformin 1000 MG (OSM) 24 hr tablet Commonly known as: FORTAMET Take 1,000 mg by mouth 2 (two) times daily with a meal.   oxyCODONE 5 MG immediate release tablet Commonly known as: Oxy IR/ROXICODONE Take 1 tablet (5 mg total) by mouth every 6 (six) hours as needed for up to 15 doses for severe pain.   rosuvastatin 5 MG tablet Commonly known as: CRESTOR Take 1 tablet (5 mg total) by mouth daily.            Discharge Care Instructions  (From admission, onward)         Start  Ordered   11/09/20 0000  Discharge wound care:       Comments: Dress wound daily   11/09/20 1432          Follow-up Information    Renaldo Harrison, DO Follow up in 1 week(s).   Specialty: Family Medicine Contact information: [address] [place] [address] [phone]        Sherren Kerns, MD. Call.   Specialties: Vascular Surgery, Cardiology Why: For return to hospital for amputation on Monday 11/13/20 Contact information: [address] [place] [address] [phone]              No Known Allergies  The results of significant diagnostics from this hospitalization (including imaging, microbiology, ancillary and laboratory) are listed below for reference.    Microbiology: Recent Results (from the past 240 hour(s))  Culture, blood (routine x 2)     Status: None   Collection Time: 11/04/20  8:20 AM   Specimen: BLOOD  Result Value Ref Range Status   Specimen Description BLOOD LEFT ANTECUBITAL  Final   Special Requests   Final    BOTTLES DRAWN AEROBIC  AND ANAEROBIC Blood Culture results may not be optimal due to an inadequate volume of blood received in culture bottles   Culture   Final    NO GROWTH 5 DAYS Performed at [facility] Lab, [address] [place], [place] [address]    Report Status 11/09/2020 FINAL  Final  Culture, blood (routine x 2)     Status: None   Collection Time: 11/04/20  8:21 AM   Specimen: BLOOD RIGHT HAND  Result Value Ref Range Status   Specimen Description BLOOD RIGHT HAND  Final   Special Requests   Final    BOTTLES DRAWN AEROBIC AND ANAEROBIC Blood Culture results may not be optimal due to an inadequate volume of blood received in culture bottles   Culture   Final    NO GROWTH 5 DAYS Performed at [facility], [address], [place], [place] [address]    Report Status 11/09/2020 FINAL  Final    Procedures/Studies: PERIPHERAL VASCULAR CATHETERIZATION  Result Date: 11/07/2020 Patient name: Roy Taylor MRN: [id number] DOB: 1965/12/25 Sex: male 11/07/2020 Pre-operative Diagnosis: Nonhealing right toe amputation Post-operative diagnosis:  Same Surgeon:  Durene Cal Procedure Performed:  1.  Ultrasound-guided access, left femoral artery  2.  Antegrade ultrasound-guided access, right femoral artery  3.  Abdominal aortogram  4.  Right lower extremity runoff  5.  Angioplasty, right peroneal artery  6.  Conscious sedation, 120 minutes  7.  Mynx closure device to left groin Indications: This is a 55 year old gentleman with diabetes who has undergone a partial toe amputation which is not healing.  He comes in today for vascular evaluation and possible intervention Procedure:  The patient was identified in the holding area and taken to room 8.  The patient was then placed supine on the table and prepped and draped in the usual sterile fashion.  A time out was called.  Conscious sedation was administered with the use of IV fentanyl and Versed under continuous physician and nurse monitoring.  Heart rate, blood pressure,  and oxygen saturation were continuously monitored.  Total sedation time was 120 minutes.  Ultrasound was used to evaluate the left common femoral artery.  It was patent .  A digital ultrasound image was acquired.  A micropuncture needle was used to access the left common femoral artery under ultrasound guidance.  An 018  wire was advanced without resistance and a micropuncture sheath was placed.  The 018 wire was removed and a benson wire was placed.  The micropuncture sheath was exchanged for a 5 french sheath.  An omniflush catheter was advanced over the wire to the level of L-1.  An abdominal angiogram was obtained.  Next, using the omniflush catheter and a benson wire, the aortic bifurcation was crossed and the catheter was placed into theright external iliac artery and right runoff was obtained.  Findings:  Aortogram: No significant renal artery stenosis.  The infrarenal abdominal aorta is widely patent.  Bilateral common and external iliac arteries are patent without stenosis  Right Lower Extremity: The right common femoral profundofemoral, superficial femoral, and popliteal artery are widely patent without stenosis.  The anterior tibial and posterior tibial arteries are occluded at their origin.  There is reconstitution of diseased anterior tibial and posterior tibial artery below the ankle.  The peroneal artery is proximally patent with a high-grade stenosis.  There is a 10 cm occlusion of the peroneal artery  Left Lower Extremity: Not evaluated Intervention: After the above images were acquired the decision was made to proceed with intervention.  I tried multiple catheters and wires to try to cross the aortic bifurcation however because of the stiffness of the bifurcation this was not possible.  I therefore elected to get antegrade access in the right common femoral artery.  This was done under ultrasound guidance with a micropuncture needle.  A 5 French 45 cm sheath was then inserted.  I used a V-18 wire  and a 3 x 100 Sterling balloon as a support catheter.  I was able to navigate the wire into the peroneal artery.  I performed balloon angioplasty of the high-grade stenosis at the origin of the peroneal artery.  I tried to cross the occlusion with multiple different wires and catheters however ultimately this was unsuccessful.  The left groin was closed with a minx.  Manual pressure will be used for closure of the antegrade access Impression:  #1  No significant inflow or outflow disease on the right leg.  #2  Angioplasty of the proximal peroneal artery was performed with a 3 mm balloon however I was unable to cross the occlusion within the peroneal artery.  #3  Consideration for pedal access for revascularization however the patient is at high risk for proximal amputation V. Durene Cal, M.D., [facility] [place] Office: [phone] Pager:  [phone]   Labs: BNP (last 3 results) No results for input(s): BNP in the last 8760 hours. Basic Metabolic Panel: Recent Labs  Lab 11/04/20 0821 11/06/20 0136 11/07/20 0305 11/08/20 0145 11/09/20 0125  NA 132* 132* 135 134* 135  K 3.7 2.9* 3.8 3.8 4.5  CL 100 99 102 104 105  CO2 23 25 26 23 25   GLUCOSE 150* 213* 158* 118* 116*  BUN 11 10 14 9 10   CREATININE 0.94 1.15 1.02 0.77 0.92  CALCIUM 8.1* 8.1* 8.2* 8.1* 8.3*  MG  --   --  2.1  --   --    Liver Function Tests: Recent Labs  Lab 11/04/20 0821 11/06/20 0136 11/07/20 0305 11/08/20 0145 11/09/20 0125  AST 24 32 40 37 36  ALT 23 28 35 30 32  ALKPHOS 81 117 136* 123 148*  BILITOT 1.3* 0.8 0.6 0.8 0.7  PROT 6.0* 5.8* 5.9* 5.9* 5.9*  ALBUMIN 2.2* 1.7* 1.8* 1.8* 1.8*   No results for input(s): LIPASE, AMYLASE in  the last 168 hours. No results for input(s): AMMONIA in the last 168 hours. CBC: Recent Labs  Lab 11/04/20 0821 11/06/20 0136 11/08/20 0145  WBC 14.3* 14.1* 12.4*  NEUTROABS  --  10.8*  --   HGB 10.9* 10.4* 10.2*  HCT 33.3* 31.5* 29.9*   MCV 81.2 81.2 79.7*  PLT 433* 445* 625*   Cardiac Enzymes: No results for input(s): CKTOTAL, CKMB, CKMBINDEX, TROPONINI in the last 168 hours. BNP: Invalid input(s): POCBNP CBG: Recent Labs  Lab 11/08/20 1104 11/08/20 1626 11/08/20 2112 11/09/20 0608 11/09/20 1127  GLUCAP 91 156* 144* 112* 99   D-Dimer No results for input(s): DDIMER in the last 72 hours. Hgb A1c No results for input(s): HGBA1C in the last 72 hours. Lipid Profile No results for input(s): CHOL, HDL, LDLCALC, TRIG, CHOLHDL, LDLDIRECT in the last 72 hours. Thyroid function studies No results for input(s): TSH, T4TOTAL, T3FREE, THYROIDAB in the last 72 hours.  Invalid input(s): FREET3 Anemia work up No results for input(s): VITAMINB12, FOLATE, FERRITIN, TIBC, IRON, RETICCTPCT in the last 72 hours. Urinalysis No results found for: COLORURINE, APPEARANCEUR, LABSPEC, PHURINE, GLUCOSEU, HGBUR, BILIRUBINUR, KETONESUR, PROTEINUR, UROBILINOGEN, NITRITE, LEUKOCYTESUR Sepsis Labs Invalid input(s): PROCALCITONIN,  WBC,  LACTICIDVEN Microbiology Recent Results (from the past 240 hour(s))  Culture, blood (routine x 2)     Status: None   Collection Time: 11/04/20  8:20 AM   Specimen: BLOOD  Result Value Ref Range Status   Specimen Description BLOOD LEFT ANTECUBITAL  Final   Special Requests   Final    BOTTLES DRAWN AEROBIC AND ANAEROBIC Blood Culture results may not be optimal due to an inadequate volume of blood received in culture bottles   Culture   Final    NO GROWTH 5 DAYS Performed at [facility] Lab, [address] [place], [place] [address]    Report Status 11/09/2020 FINAL  Final  Culture, blood (routine x 2)     Status: None   Collection Time: 11/04/20  8:21 AM   Specimen: BLOOD RIGHT HAND  Result Value Ref Range Status   Specimen Description BLOOD RIGHT HAND  Final   Special Requests   Final    BOTTLES DRAWN AEROBIC AND ANAEROBIC Blood Culture results may not be optimal due to an inadequate volume  of blood received in culture bottles   Culture   Final    NO GROWTH 5 DAYS Performed at [facility], [address], [place], [place] [address]    Report Status 11/09/2020 FINAL  Final     Time coordinating discharge: 35 minutes  SIGNED: Lanae Boast, MD  [facility] 11/09/2020, 2:32 PM  If 7PM-7AM, please contact night-coverage [email or web address]

## 2020-11-09 NOTE — Care Management Important Message (Signed)
Important Message  Patient Details  Name: Roy Taylor MRN: [id number] Date of Birth: February 20, 1966   Medicare Important Message Given:  Yes     [name] 11/09/2020, 8:35 AM

## 2020-11-10 ENCOUNTER — Encounter (HOSPITAL_COMMUNITY): Payer: Self-pay | Admitting: Vascular Surgery

## 2020-11-10 NOTE — Progress Notes (Signed)
Unable to reach patient.  Left instructions and arrival time on mobile number

## 2020-11-13 ENCOUNTER — Inpatient Hospital Stay (HOSPITAL_COMMUNITY): Payer: Medicare HMO | Admitting: Anesthesiology

## 2020-11-13 ENCOUNTER — Other Ambulatory Visit: Payer: Self-pay

## 2020-11-13 ENCOUNTER — Encounter (HOSPITAL_COMMUNITY)
Admission: RE | Disposition: A | Discharge status: Skilled Nursing Facility | Payer: Self-pay | Source: Home / Self Care | Attending: Internal Medicine

## 2020-11-13 ENCOUNTER — Encounter (HOSPITAL_COMMUNITY): Payer: Self-pay | Admitting: Vascular Surgery

## 2020-11-13 ENCOUNTER — Inpatient Hospital Stay (HOSPITAL_COMMUNITY)
Admission: RE | Admit: 2020-11-13 | Discharge: 2020-11-20 | DRG: 239 | Disposition: A | Discharge status: Skilled Nursing Facility | Payer: Medicare HMO | Attending: Family Medicine | Admitting: Family Medicine

## 2020-11-13 DIAGNOSIS — E8809 Other disorders of plasma-protein metabolism, not elsewhere classified: Secondary | ICD-10-CM | POA: Diagnosis present

## 2020-11-13 DIAGNOSIS — Z6839 Body mass index (BMI) 39.0-39.9, adult: Secondary | ICD-10-CM

## 2020-11-13 DIAGNOSIS — E43 Unspecified severe protein-calorie malnutrition: Secondary | ICD-10-CM | POA: Diagnosis present

## 2020-11-13 DIAGNOSIS — I7025 Atherosclerosis of native arteries of other extremities with ulceration: Secondary | ICD-10-CM | POA: Diagnosis not present

## 2020-11-13 DIAGNOSIS — G4733 Obstructive sleep apnea (adult) (pediatric): Secondary | ICD-10-CM | POA: Diagnosis present

## 2020-11-13 DIAGNOSIS — Z7982 Long term (current) use of aspirin: Secondary | ICD-10-CM

## 2020-11-13 DIAGNOSIS — L97519 Non-pressure chronic ulcer of other part of right foot with unspecified severity: Secondary | ICD-10-CM | POA: Diagnosis present

## 2020-11-13 DIAGNOSIS — Z23 Encounter for immunization: Secondary | ICD-10-CM

## 2020-11-13 DIAGNOSIS — I998 Other disorder of circulatory system: Secondary | ICD-10-CM | POA: Diagnosis present

## 2020-11-13 DIAGNOSIS — D638 Anemia in other chronic diseases classified elsewhere: Secondary | ICD-10-CM | POA: Diagnosis present

## 2020-11-13 DIAGNOSIS — Z89411 Acquired absence of right great toe: Secondary | ICD-10-CM | POA: Diagnosis not present

## 2020-11-13 DIAGNOSIS — Z79899 Other long term (current) drug therapy: Secondary | ICD-10-CM

## 2020-11-13 DIAGNOSIS — L97509 Non-pressure chronic ulcer of other part of unspecified foot with unspecified severity: Secondary | ICD-10-CM

## 2020-11-13 DIAGNOSIS — E1152 Type 2 diabetes mellitus with diabetic peripheral angiopathy with gangrene: Secondary | ICD-10-CM | POA: Diagnosis not present

## 2020-11-13 DIAGNOSIS — I10 Essential (primary) hypertension: Secondary | ICD-10-CM | POA: Diagnosis not present

## 2020-11-13 DIAGNOSIS — Z794 Long term (current) use of insulin: Secondary | ICD-10-CM | POA: Diagnosis not present

## 2020-11-13 DIAGNOSIS — D72829 Elevated white blood cell count, unspecified: Secondary | ICD-10-CM | POA: Diagnosis not present

## 2020-11-13 DIAGNOSIS — E785 Hyperlipidemia, unspecified: Secondary | ICD-10-CM | POA: Diagnosis present

## 2020-11-13 DIAGNOSIS — S88111A Complete traumatic amputation at level between knee and ankle, right lower leg, initial encounter: Secondary | ICD-10-CM | POA: Diagnosis not present

## 2020-11-13 DIAGNOSIS — Z20822 Contact with and (suspected) exposure to covid-19: Secondary | ICD-10-CM | POA: Diagnosis present

## 2020-11-13 DIAGNOSIS — I70261 Atherosclerosis of native arteries of extremities with gangrene, right leg: Secondary | ICD-10-CM

## 2020-11-13 HISTORY — PX: AMPUTATION: SHX166

## 2020-11-13 HISTORY — DX: Non-pressure chronic ulcer of other part of unspecified foot with unspecified severity: L97.509

## 2020-11-13 LAB — GLUCOSE, CAPILLARY
Glucose-Capillary: 176 mg/dL — ABNORMAL HIGH (ref 70–99)
Glucose-Capillary: 162 mg/dL — ABNORMAL HIGH (ref 70–99)
Glucose-Capillary: 173 mg/dL — ABNORMAL HIGH (ref 70–99)
Glucose-Capillary: 264 mg/dL — ABNORMAL HIGH (ref 70–99)
Glucose-Capillary: 183 mg/dL — ABNORMAL HIGH (ref 70–99)

## 2020-11-13 LAB — CBC
HCT: 28.9 % — ABNORMAL LOW (ref 39.0–52.0)
Hemoglobin: 9.2 g/dL — ABNORMAL LOW (ref 13.0–17.0)
MCH: 26.4 pg (ref 26.0–34.0)
MCHC: 31.8 g/dL (ref 30.0–36.0)
MCV: 82.8 fL (ref 80.0–100.0)
Platelets: 660 10*3/uL — ABNORMAL HIGH (ref 150–400)
RBC: 3.49 MIL/uL — ABNORMAL LOW (ref 4.22–5.81)
RDW: 13.1 % (ref 11.5–15.5)
WBC: 13.9 10*3/uL — ABNORMAL HIGH (ref 4.0–10.5)
nRBC: 0 % (ref 0.0–0.2)

## 2020-11-13 LAB — APTT: aPTT: 35 seconds (ref 24–36)

## 2020-11-13 LAB — TYPE AND SCREEN
ABO/RH(D): A POS
Antibody Screen: NEGATIVE

## 2020-11-13 LAB — URINALYSIS, ROUTINE W REFLEX MICROSCOPIC
Bilirubin Urine: NEGATIVE
Glucose, UA: 50 mg/dL — AB
Ketones, ur: NEGATIVE mg/dL
Leukocytes,Ua: NEGATIVE
Nitrite: NEGATIVE
Protein, ur: 100 mg/dL — AB
Specific Gravity, Urine: 1.017 (ref 1.005–1.030)
pH: 5 (ref 5.0–8.0)

## 2020-11-13 LAB — HEMOGLOBIN AND HEMATOCRIT, BLOOD
HCT: 29.6 % — ABNORMAL LOW (ref 39.0–52.0)
Hemoglobin: 9.6 g/dL — ABNORMAL LOW (ref 13.0–17.0)

## 2020-11-13 LAB — CREATININE, SERUM
Creatinine, Ser: 0.88 mg/dL (ref 0.61–1.24)
GFR, Estimated: >60 mL/min (ref >60)

## 2020-11-13 LAB — ABO/RH: ABO/RH(D): A POS

## 2020-11-13 LAB — PROTIME-INR
INR: 1.2 (ref 0.8–1.2)
Prothrombin Time: 14.3 seconds (ref 11.4–15.2)

## 2020-11-13 SURGERY — AMPUTATION BELOW KNEE
Anesthesia: General | Site: Leg Lower | Laterality: Right

## 2020-11-13 MED ORDER — HYDRALAZINE HCL 25 MG PO TABS
25.0000 mg | ORAL_TABLET | Freq: Four times a day (QID) | ORAL | Status: DC | PRN
  Administered 2020-11-13 – 2020-11-18 (×4): 25 mg via ORAL
  Filled 2020-11-13 (×7): qty 1

## 2020-11-13 MED ORDER — LISINOPRIL 20 MG PO TABS
40.0000 mg | ORAL_TABLET | Freq: Every day | ORAL | Status: DC
  Administered 2020-11-14 – 2020-11-20 (×7): 40 mg via ORAL
  Filled 2020-11-13 (×2): qty 2
  Filled 2020-11-13 (×3): qty 4
  Filled 2020-11-13 (×2): qty 2
  Filled 2020-11-13: qty 4
  Filled 2020-11-13: qty 2
  Filled 2020-11-13: qty 4
  Filled 2020-11-13: qty 2
  Filled 2020-11-13: qty 4
  Filled 2020-11-13: qty 2

## 2020-11-13 MED ORDER — CARVEDILOL 6.25 MG PO TABS
6.2500 mg | ORAL_TABLET | Freq: Once | ORAL | Status: AC
  Administered 2020-11-13: 6.25 mg via ORAL
  Filled 2020-11-13: qty 1

## 2020-11-13 MED ORDER — PROPOFOL 10 MG/ML IV BOLUS
INTRAVENOUS | Status: AC
  Filled 2020-11-13: qty 20

## 2020-11-13 MED ORDER — FENTANYL CITRATE (PF) 250 MCG/5ML IJ SOLN
INTRAMUSCULAR | Status: AC
  Filled 2020-11-13: qty 5

## 2020-11-13 MED ORDER — CHLORHEXIDINE GLUCONATE CLOTH 2 % EX PADS: 6.0000 | MEDICATED_PAD | Freq: Once | CUTANEOUS | Status: DC

## 2020-11-13 MED ORDER — ASPIRIN EC 81 MG PO TBEC
81.0000 mg | DELAYED_RELEASE_TABLET | Freq: Every day | ORAL | Status: DC
Start: 2020-11-13 — End: 2020-11-20
  Administered 2020-11-13 – 2020-11-20 (×8): 81 mg via ORAL
  Filled 2020-11-13 (×8): qty 1

## 2020-11-13 MED ORDER — AMISULPRIDE (ANTIEMETIC) 5 MG/2ML IV SOLN: 10.0000 mg | Freq: Once | INTRAVENOUS | Status: DC | PRN

## 2020-11-13 MED ORDER — OXYCODONE-ACETAMINOPHEN 5-325 MG PO TABS
1.0000 | ORAL_TABLET | ORAL | Status: DC | PRN
  Administered 2020-11-13 – 2020-11-20 (×12): 2 via ORAL
  Filled 2020-11-13 (×12): qty 2

## 2020-11-13 MED ORDER — CHLORHEXIDINE GLUCONATE 0.12 % MT SOLN: 15.0000 mL | Freq: Once | OROMUCOSAL | Status: AC

## 2020-11-13 MED ORDER — DULOXETINE HCL 30 MG PO CPEP
30.0000 mg | ORAL_CAPSULE | Freq: Every day | ORAL | Status: DC
  Administered 2020-11-13 – 2020-11-20 (×8): 30 mg via ORAL
  Filled 2020-11-13 (×8): qty 1

## 2020-11-13 MED ORDER — ONDANSETRON HCL 4 MG/2ML IJ SOLN: 4.0000 mg | Freq: Four times a day (QID) | INTRAMUSCULAR | Status: DC | PRN

## 2020-11-13 MED ORDER — LACTATED RINGERS IV SOLN: INTRAVENOUS | Status: DC

## 2020-11-13 MED ORDER — CHLORHEXIDINE GLUCONATE 0.12 % MT SOLN
OROMUCOSAL | Status: AC
  Administered 2020-11-13: 15 mL via OROMUCOSAL
  Filled 2020-11-13: qty 15

## 2020-11-13 MED ORDER — OXYCODONE HCL 5 MG PO TABS: 5.0000 mg | ORAL_TABLET | Freq: Four times a day (QID) | ORAL | Status: DC | PRN

## 2020-11-13 MED ORDER — PHENYLEPHRINE 40 MCG/ML (10ML) SYRINGE FOR IV PUSH (FOR BLOOD PRESSURE SUPPORT)
PREFILLED_SYRINGE | INTRAVENOUS | Status: DC | PRN
  Administered 2020-11-13 (×3): 120 ug via INTRAVENOUS

## 2020-11-13 MED ORDER — HYDROMORPHONE HCL 1 MG/ML IJ SOLN: 0.5000 mg | INTRAMUSCULAR | Status: DC | PRN

## 2020-11-13 MED ORDER — BUPIVACAINE HCL (PF) 0.25 % IJ SOLN
INTRAMUSCULAR | Status: DC | PRN
  Administered 2020-11-13: 20 mL

## 2020-11-13 MED ORDER — PROPOFOL 10 MG/ML IV BOLUS
INTRAVENOUS | Status: DC | PRN
  Administered 2020-11-13: 250 mg via INTRAVENOUS

## 2020-11-13 MED ORDER — BACITRACIN ZINC 500 UNIT/GM EX OINT
TOPICAL_OINTMENT | CUTANEOUS | Status: AC
  Filled 2020-11-13: qty 28.35

## 2020-11-13 MED ORDER — DOCUSATE SODIUM 100 MG PO CAPS
100.0000 mg | ORAL_CAPSULE | Freq: Every day | ORAL | Status: DC
  Administered 2020-11-14 – 2020-11-20 (×7): 100 mg via ORAL
  Filled 2020-11-13 (×7): qty 1

## 2020-11-13 MED ORDER — DAPAGLIFLOZIN PROPANEDIOL 10 MG PO TABS
10.0000 mg | ORAL_TABLET | Freq: Every day | ORAL | Status: DC
Start: 2020-11-13 — End: 2020-11-20
  Administered 2020-11-13 – 2020-11-20 (×8): 10 mg via ORAL
  Filled 2020-11-13 (×8): qty 1

## 2020-11-13 MED ORDER — CARVEDILOL 6.25 MG PO TABS
6.2500 mg | ORAL_TABLET | Freq: Two times a day (BID) | ORAL | Status: DC
  Administered 2020-11-13 – 2020-11-18 (×9): 6.25 mg via ORAL
  Filled 2020-11-13 (×10): qty 1

## 2020-11-13 MED ORDER — ACETAMINOPHEN 500 MG PO TABS
1000.0000 mg | ORAL_TABLET | Freq: Once | ORAL | Status: AC
  Administered 2020-11-13: 1000 mg via ORAL
  Filled 2020-11-13: qty 2

## 2020-11-13 MED ORDER — MIDAZOLAM HCL 2 MG/2ML IJ SOLN
INTRAMUSCULAR | Status: AC
  Administered 2020-11-13: 2 mg via INTRAVENOUS
  Filled 2020-11-13: qty 2

## 2020-11-13 MED ORDER — ENOXAPARIN SODIUM 40 MG/0.4ML ~~LOC~~ SOLN
40.0000 mg | SUBCUTANEOUS | Status: DC
  Administered 2020-11-14 – 2020-11-19 (×6): 40 mg via SUBCUTANEOUS
  Filled 2020-11-13 (×6): qty 0.4

## 2020-11-13 MED ORDER — PHENYLEPHRINE HCL-NACL 10-0.9 MG/250ML-% IV SOLN
INTRAVENOUS | Status: DC | PRN
  Administered 2020-11-13: 75 ug/min via INTRAVENOUS

## 2020-11-13 MED ORDER — PHENYLEPHRINE 40 MCG/ML (10ML) SYRINGE FOR IV PUSH (FOR BLOOD PRESSURE SUPPORT)
PREFILLED_SYRINGE | INTRAVENOUS | Status: AC
  Filled 2020-11-13: qty 20

## 2020-11-13 MED ORDER — CARVEDILOL 3.125 MG PO TABS
ORAL_TABLET | ORAL | Status: AC
  Administered 2020-11-13: 6.25 mg via ORAL
  Filled 2020-11-13: qty 2

## 2020-11-13 MED ORDER — INSULIN ASPART 100 UNIT/ML ~~LOC~~ SOLN
0.0000 [IU] | Freq: Three times a day (TID) | SUBCUTANEOUS | Status: DC
  Administered 2020-11-14 – 2020-11-19 (×4): 2 [IU] via SUBCUTANEOUS
  Administered 2020-11-20: 5 [IU] via SUBCUTANEOUS
  Administered 2020-11-20: 2 [IU] via SUBCUTANEOUS

## 2020-11-13 MED ORDER — ROPIVACAINE HCL 5 MG/ML IJ SOLN
INTRAMUSCULAR | Status: DC | PRN
  Administered 2020-11-13: 20 mL via PERINEURAL

## 2020-11-13 MED ORDER — FENTANYL CITRATE (PF) 100 MCG/2ML IJ SOLN: 25.0000 ug | INTRAMUSCULAR | Status: DC | PRN

## 2020-11-13 MED ORDER — INSULIN DETEMIR 100 UNIT/ML ~~LOC~~ SOLN
35.0000 [IU] | Freq: Every day | SUBCUTANEOUS | Status: DC
Start: 2020-11-13 — End: 2020-11-20
  Administered 2020-11-13 – 2020-11-19 (×6): 35 [IU] via SUBCUTANEOUS
  Filled 2020-11-13 (×8): qty 0.35

## 2020-11-13 MED ORDER — ALBUMIN HUMAN 5 % IV SOLN: INTRAVENOUS | Status: DC | PRN

## 2020-11-13 MED ORDER — INSULIN ASPART 100 UNIT/ML ~~LOC~~ SOLN
0.0000 [IU] | Freq: Every day | SUBCUTANEOUS | Status: DC
  Administered 2020-11-18: 2 [IU] via SUBCUTANEOUS

## 2020-11-13 MED ORDER — MIDAZOLAM HCL 2 MG/2ML IJ SOLN: 2.0000 mg | Freq: Once | INTRAMUSCULAR | Status: AC

## 2020-11-13 MED ORDER — LIDOCAINE HCL (CARDIAC) PF 100 MG/5ML IV SOSY
PREFILLED_SYRINGE | INTRAVENOUS | Status: DC | PRN
  Administered 2020-11-13: 20 mg via INTRAVENOUS

## 2020-11-13 MED ORDER — IBUPROFEN 600 MG PO TABS
600.0000 mg | ORAL_TABLET | Freq: Four times a day (QID) | ORAL | Status: DC | PRN
  Administered 2020-11-19: 600 mg via ORAL
  Filled 2020-11-13: qty 1

## 2020-11-13 MED ORDER — MIDAZOLAM HCL 2 MG/2ML IJ SOLN
INTRAMUSCULAR | Status: AC
  Filled 2020-11-13: qty 2

## 2020-11-13 MED ORDER — BUPIVACAINE LIPOSOME 1.3 % IJ SUSP
INTRAMUSCULAR | Status: DC | PRN
  Administered 2020-11-13: 10 mL via PERINEURAL

## 2020-11-13 MED ORDER — ROSUVASTATIN CALCIUM 5 MG PO TABS
5.0000 mg | ORAL_TABLET | Freq: Every day | ORAL | Status: DC
  Administered 2020-11-13 – 2020-11-16 (×4): 5 mg via ORAL
  Filled 2020-11-13 (×4): qty 1

## 2020-11-13 MED ORDER — FENTANYL CITRATE (PF) 100 MCG/2ML IJ SOLN: 100.0000 ug | Freq: Once | INTRAMUSCULAR | Status: AC

## 2020-11-13 MED ORDER — AMLODIPINE BESYLATE 5 MG PO TABS
5.0000 mg | ORAL_TABLET | Freq: Every day | ORAL | Status: DC
  Administered 2020-11-13 – 2020-11-15 (×3): 5 mg via ORAL
  Filled 2020-11-13 (×3): qty 1

## 2020-11-13 MED ORDER — ONDANSETRON HCL 4 MG/2ML IJ SOLN
INTRAMUSCULAR | Status: DC | PRN
  Administered 2020-11-13: 4 mg via INTRAVENOUS

## 2020-11-13 MED ORDER — ORAL CARE MOUTH RINSE: 15.0000 mL | Freq: Once | OROMUCOSAL | Status: AC

## 2020-11-13 MED ORDER — CELECOXIB 200 MG PO CAPS
200.0000 mg | ORAL_CAPSULE | Freq: Once | ORAL | Status: AC
  Administered 2020-11-13: 200 mg via ORAL
  Filled 2020-11-13: qty 1

## 2020-11-13 MED ORDER — 0.9 % SODIUM CHLORIDE (POUR BTL) OPTIME
TOPICAL | Status: DC | PRN
  Administered 2020-11-13: 1000 mL

## 2020-11-13 MED ORDER — DEXAMETHASONE SODIUM PHOSPHATE 10 MG/ML IJ SOLN
INTRAMUSCULAR | Status: DC | PRN
  Administered 2020-11-13: 4 mg via INTRAVENOUS

## 2020-11-13 MED ORDER — DEXAMETHASONE SODIUM PHOSPHATE 10 MG/ML IJ SOLN
INTRAMUSCULAR | Status: AC
  Filled 2020-11-13: qty 1

## 2020-11-13 MED ORDER — PHENOL 1.4 % MT LIQD: 1.0000 | OROMUCOSAL | Status: DC | PRN

## 2020-11-13 MED ORDER — DEXTROSE 5 % IV SOLN
3.0000 g | INTRAVENOUS | Status: AC
  Administered 2020-11-13: 3 g via INTRAVENOUS
  Filled 2020-11-13: qty 3000

## 2020-11-13 MED ORDER — PANTOPRAZOLE SODIUM 40 MG PO TBEC
40.0000 mg | DELAYED_RELEASE_TABLET | Freq: Every day | ORAL | Status: DC
  Administered 2020-11-13 – 2020-11-20 (×8): 40 mg via ORAL
  Filled 2020-11-13 (×8): qty 1

## 2020-11-13 MED ORDER — CARVEDILOL 3.125 MG PO TABS: 6.2500 mg | ORAL_TABLET | Freq: Once | ORAL | Status: AC

## 2020-11-13 MED ORDER — FENTANYL CITRATE (PF) 100 MCG/2ML IJ SOLN
INTRAMUSCULAR | Status: AC
  Administered 2020-11-13: 100 ug via INTRAVENOUS
  Filled 2020-11-13: qty 2

## 2020-11-13 MED ORDER — SODIUM CHLORIDE 0.9 % IV SOLN: INTRAVENOUS | Status: DC

## 2020-11-13 MED ORDER — METFORMIN HCL ER 500 MG PO TB24
1000.0000 mg | ORAL_TABLET | Freq: Two times a day (BID) | ORAL | Status: DC
  Administered 2020-11-13 – 2020-11-20 (×11): 1000 mg via ORAL
  Filled 2020-11-13 (×13): qty 2

## 2020-11-13 MED ORDER — MORPHINE SULFATE (PF) 2 MG/ML IV SOLN: 2.0000 mg | INTRAVENOUS | Status: DC | PRN

## 2020-11-13 SURGICAL SUPPLY — 50 items
BANDAGE ESMARK 6X9 LF (GAUZE/BANDAGES/DRESSINGS) ×1 IMPLANT
BLADE SAW RECIP 87.9 MT (BLADE) ×2 IMPLANT
BNDG COHESIVE 6X5 TAN STRL LF (GAUZE/BANDAGES/DRESSINGS) ×2 IMPLANT
BNDG ELASTIC 4X5.8 VLCR STR LF (GAUZE/BANDAGES/DRESSINGS) ×2 IMPLANT
BNDG ELASTIC 6X5.8 VLCR STR LF (GAUZE/BANDAGES/DRESSINGS) ×4 IMPLANT
BNDG ESMARK 6X9 LF (GAUZE/BANDAGES/DRESSINGS) ×2
BNDG GAUZE ELAST 4 BULKY (GAUZE/BANDAGES/DRESSINGS) ×4 IMPLANT
CANISTER SUCT 3000ML PPV (MISCELLANEOUS) ×2 IMPLANT
CLIP VESOCCLUDE MED 6/CT (CLIP) ×2 IMPLANT
COVER SURGICAL LIGHT HANDLE (MISCELLANEOUS) ×2 IMPLANT
COVER WAND RF STERILE (DRAPES) IMPLANT
CUFF TOURN SGL QUICK 18X4 (TOURNIQUET CUFF) IMPLANT
CUFF TOURN SGL QUICK 24 (TOURNIQUET CUFF)
CUFF TOURN SGL QUICK 34 (TOURNIQUET CUFF) ×2
CUFF TOURN SGL QUICK 42 (TOURNIQUET CUFF) IMPLANT
CUFF TRNQT CYL 24X4X16.5-23 (TOURNIQUET CUFF) IMPLANT
CUFF TRNQT CYL 34X4.125X (TOURNIQUET CUFF) ×1 IMPLANT
DRAIN CHANNEL 19F RND (DRAIN) IMPLANT
DRAIN JACKSON PRATT 10MM FLAT (MISCELLANEOUS) ×2 IMPLANT
DRAPE HALF SHEET 40X57 (DRAPES) ×4 IMPLANT
DRAPE ORTHO SPLIT 77X108 STRL (DRAPES) ×4
DRAPE SURG ORHT 6 SPLT 77X108 (DRAPES) ×2 IMPLANT
DRSG ADAPTIC 3X8 NADH LF (GAUZE/BANDAGES/DRESSINGS) ×2 IMPLANT
ELECT REM PT RETURN 9FT ADLT (ELECTROSURGICAL) ×2
ELECTRODE REM PT RTRN 9FT ADLT (ELECTROSURGICAL) ×1 IMPLANT
EVACUATOR SILICONE 100CC (DRAIN) ×2 IMPLANT
GAUZE SPONGE 4X4 12PLY STRL (GAUZE/BANDAGES/DRESSINGS) ×4 IMPLANT
GLOVE BIO SURGEON STRL SZ7.5 (GLOVE) ×2 IMPLANT
GOWN STRL REUS W/ TWL LRG LVL3 (GOWN DISPOSABLE) ×3 IMPLANT
GOWN STRL REUS W/TWL LRG LVL3 (GOWN DISPOSABLE) ×6
KIT BASIN OR (CUSTOM PROCEDURE TRAY) ×2 IMPLANT
KIT TURNOVER KIT B (KITS) ×2 IMPLANT
NS IRRIG 1000ML POUR BTL (IV SOLUTION) ×2 IMPLANT
PACK GENERAL/GYN (CUSTOM PROCEDURE TRAY) ×2 IMPLANT
PAD ARMBOARD 7.5X6 YLW CONV (MISCELLANEOUS) ×4 IMPLANT
PIN SAFETY STERILE (MISCELLANEOUS) ×2 IMPLANT
STAPLER VISISTAT 35W (STAPLE) ×4 IMPLANT
STOCKINETTE IMPERVIOUS LG (DRAPES) ×2 IMPLANT
SUT ETHILON 3 0 PS 1 (SUTURE) ×2 IMPLANT
SUT SILK 2 0 (SUTURE) ×2
SUT SILK 2 0 SH CR/8 (SUTURE) ×4 IMPLANT
SUT SILK 2-0 18XBRD TIE 12 (SUTURE) ×1 IMPLANT
SUT SILK 3 0 (SUTURE) ×2
SUT SILK 3-0 18XBRD TIE 12 (SUTURE) ×1 IMPLANT
SUT VIC AB 2-0 SH 18 (SUTURE) ×8 IMPLANT
SUT VIC AB 3-0 SH 27 (SUTURE) ×6
SUT VIC AB 3-0 SH 27X BRD (SUTURE) ×3 IMPLANT
TOWEL GREEN STERILE (TOWEL DISPOSABLE) ×4 IMPLANT
UNDERPAD 30X36 HEAVY ABSORB (UNDERPADS AND DIAPERS) ×2 IMPLANT
WATER STERILE IRR 1000ML POUR (IV SOLUTION) ×2 IMPLANT

## 2020-11-13 NOTE — Anesthesia Preprocedure Evaluation (Signed)
Anesthesia Evaluation  Patient identified by MRN, date of birth, ID band Patient awake    Reviewed: Allergy & Precautions, NPO status , Patient's Chart, lab work & pertinent test results  Airway Mallampati: III  TM Distance: >3 FB Neck ROM: Full    Dental  (+) Dental Advisory Given   Pulmonary sleep apnea ,    breath sounds clear to auscultation       Cardiovascular hypertension, Pt. on medications and Pt. on home beta blockers + Peripheral Vascular Disease   Rhythm:Regular Rate:Normal     Neuro/Psych negative neurological ROS     GI/Hepatic negative GI ROS, Neg liver ROS,   Endo/Other  diabetes, Type 2, Insulin DependentMorbid obesity  Renal/GU negative Renal ROS     Musculoskeletal   Abdominal   Peds  Hematology  (+) anemia ,   Anesthesia Other Findings   Reproductive/Obstetrics                             Lab Results  Component Value Date   WBC 12.4 (H) 11/08/2020   HGB 10.2 (L) 11/08/2020   HCT 29.9 (L) 11/08/2020   MCV 79.7 (L) 11/08/2020   PLT 625 (H) 11/08/2020   Lab Results  Component Value Date   CREATININE 0.92 11/09/2020   BUN 10 11/09/2020   NA 135 11/09/2020   K 4.5 11/09/2020   CL 105 11/09/2020   CO2 25 11/09/2020    Anesthesia Physical Anesthesia Plan  ASA: III  Anesthesia Plan: General   Post-op Pain Management:  Regional for Post-op pain   Induction: Intravenous  PONV Risk Score and Plan: 2 and Dexamethasone, Ondansetron, Treatment may vary due to age or medical condition and Midazolam  Airway Management Planned: LMA  Additional Equipment: None  Intra-op Plan:   Post-operative Plan: Extubation in OR  Informed Consent: I have reviewed the patients History and Physical, chart, labs and discussed the procedure including the risks, benefits and alternatives for the proposed anesthesia with the patient or authorized representative who has indicated  his/her understanding and acceptance.     Dental advisory given  Plan Discussed with: CRNA  Anesthesia Plan Comments:         Anesthesia Quick Evaluation

## 2020-11-13 NOTE — Op Note (Signed)
Procedure: Right below-knee amputation  Preoperative diagnosis: Gangrene right foot  Postoperative diagnosis: Same  Anesthesia: General  Assistant: Doreatha Massed, PA-C to expedite procedure  Operative findings: Well-perfused tissue, copious edema fluid  Operative details: After obtaining informed consent, the patient was taken the operating room.  The patient was placed in supine position on the operating table.  After induction of general anesthesia the patient's right lower extremity was prepped and draped in usual sterile fashion. An Esmarch was used to exsanguinate the leg.  A tourniquet was then inflated to 300 mmHg.  Transverse incision was made about 6 fingerbreadths below the tibial tuberosity.  The incision was carried down through the subcutaneous tissues down to the level of fascia.  There was a large amount of edema fluid in the tissues.  The incision was then extended longitudinally to create a posterior flap.  The tibia and fibula were dissected free circumferentially.  A saw was used to transect the tibia about 2 cm above the skin edge.  The fibula was transected about 2 cm higher than this.  The leg was then elevated in the field and an amputation knife used to complete the amputation posterior to the bones.  Leg was passed off table specimen.  The arterial structures were controlled with suture ligature 2-0 silk.  Tibial nerve was pulled down in the operative field clamped high and allowed to retract up into the leg.  Sural nerve was pulled down into the leg transected and allowed to retract up into the leg.  The tourniquet was then deflated.  There was still some venous bleeding and this was controlled with figure-of-eight silks sutures as well as cautery.  The wound was then thoroughly irrigated with normal saline solution.  After there was good hemostasis, a 10 flat Jackson-Pratt drain was placed in the wound bed.  This was sutured to the skin with a 3-0 nylon suture.  2-0 Vicryl  sutures were used to reapproximate the fascia.  3-0 Vicryl suture was used to approximate the subcutaneous tissues.  The skin was closed with staples.  The patient tolerated procedure well and there were no complications.  The instrument sponge and needle counts correct the end of the case.  Patient was taken the recovery room in stable condition.  Fabienne Bruns, MD [facility] [place] Office: [phone]

## 2020-11-13 NOTE — H&P (Signed)
History and Physical    Roy Taylor HYQ:[id number] DOB: [birth year] DOA: 11/13/2020  PCP: Renaldo Harrison, DO (Confirm with patient/family/NH records and if not entered, this has to be entered at [facility] point of entry) Patient coming from: Home  I have personally briefly reviewed patient's old medical records in [facility]  Chief Complaint: Pain  HPI: Roy Taylor is a 55 y.o. male with medical history significant of PVD with right lower extremity ischemia and nonhealing ulcer status post several great toes amputation 2 weeks ago at [facility], IDDM, HTN, HLD, OSA, presented with elective right BKA.  Patient was recently hospitalized for worsening of right foot gangrene and worsening of ischemia on the right lower extremity, PTA on 3/22 found severe compromised runoff and patient was scheduled for today's BKA.  Time I saw the patient, patient seen in the recovery room status post right BKA, awake alert and complaining about surgical site pain.  His blood pressure was significantly elevated earlier this morning, and now is controlled.  According to OR records, estimated blood loss around 50 mL.  Patient denied any chest pain no shortness of breath.  Review of Systems: As per HPI otherwise 14 point review of systems negative.    Past Medical History:  Diagnosis Date  . Diabetes mellitus without complication (HCC)   . Hypertension     Past Surgical History:  Procedure Laterality Date  . ABDOMINAL AORTOGRAM W/LOWER EXTREMITY N/A 11/07/2020   Procedure: ABDOMINAL AORTOGRAM W/LOWER EXTREMITY;  Surgeon: Nada Libman, MD;  Location: [facility] INVASIVE CV LAB;  Service: Cardiovascular;  Laterality: N/A;  . PERIPHERAL VASCULAR BALLOON ANGIOPLASTY Right 11/07/2020   Procedure: PERIPHERAL VASCULAR BALLOON ANGIOPLASTY;  Surgeon: Nada Libman, MD;  Location: [facility] INVASIVE CV LAB;  Service: Cardiovascular;  Laterality: Right;  peroneal     reports that he has never smoked. He has never used smokeless  tobacco. He reports previous alcohol use. He reports that he does not use drugs.  No Known Allergies  History reviewed. No pertinent family history.   Prior to Admission medications   Medication Sig Start Date End Date Taking? Authorizing Provider  amoxicillin-clavulanate (AUGMENTIN) 875-125 MG tablet Take 1 tablet by mouth 2 (two) times daily for 7 days. 11/09/20 11/16/20 Yes Lanae Boast, MD  aspirin EC 81 MG EC tablet Take 1 tablet (81 mg total) by mouth daily. Swallow whole. 11/09/20 12/09/20 Yes Lanae Boast, MD  carvedilol (COREG) 6.25 MG tablet Take 1 tablet (6.25 mg total) by mouth 2 (two) times daily with a meal. 11/09/20 12/09/20 Yes Kc, Dayna Barker, MD  dapagliflozin propanediol (FARXIGA) 10 MG TABS tablet Take 10 mg by mouth daily.   Yes [provider]  DULoxetine (CYMBALTA) 30 MG capsule Take 30 mg by mouth daily.   Yes [provider]  ibuprofen (ADVIL) 600 MG tablet Take 1 tablet (600 mg total) by mouth every 6 (six) hours as needed for up to 30 doses for moderate pain. 11/09/20  Yes Kc, Dayna Barker, MD  insulin detemir (LEVEMIR) 100 UNIT/ML injection Inject 35 Units into the skin at bedtime.   Yes [provider]  lisinopril (ZESTRIL) 40 MG tablet Take 40 mg by mouth daily.   Yes [provider]  metformin (FORTAMET) 1000 MG (OSM) 24 hr tablet Take 1,000 mg by mouth 2 (two) times daily with a meal.   Yes [provider]  oxyCODONE (OXY IR/ROXICODONE) 5 MG immediate release tablet Take 1 tablet (5 mg total) by mouth every 6 (six) hours  as needed for up to 15 doses for severe pain. 11/09/20  Yes Lanae Boast, MD  rosuvastatin (CRESTOR) 5 MG tablet Take 1 tablet (5 mg total) by mouth daily. 11/09/20 12/09/20 Yes Lanae Boast, MD    Physical Exam: Vitals:   11/13/20 1242 11/13/20 1243 11/13/20 1258 11/13/20 1305  BP: 132/84 136/84 139/86   Pulse: 83 83 83   Resp: 19 20 (!) 24   Temp:    97.6 F (36.4 C)  TempSrc:      SpO2: 95% 90% 93%   Weight:       Height:        Constitutional: NAD, calm, comfortable Vitals:   11/13/20 1242 11/13/20 1243 11/13/20 1258 11/13/20 1305  BP: 132/84 136/84 139/86   Pulse: 83 83 83   Resp: 19 20 (!) 24   Temp:    97.6 F (36.4 C)  TempSrc:      SpO2: 95% 90% 93%   Weight:      Height:       Eyes: PERRL, lids and conjunctivae normal ENMT: Mucous membranes are moist. Posterior pharynx clear of any exudate or lesions.Normal dentition.  Neck: normal, supple, no masses, no thyromegaly Respiratory: clear to auscultation bilaterally, no wheezing, no crackles. Normal respiratory effort. No accessory muscle use.  Cardiovascular: Regular rate and rhythm, no murmurs / rubs / gallops. No extremity edema. 2+ pedal pulses. No carotid bruits.  Abdomen: no tenderness, no masses palpated. No hepatosplenomegaly. Bowel sounds positive.  Musculoskeletal: no clubbing / cyanosis. No joint deformity upper and lower extremities.  Right BKA stump site wrapped in surgical dressing, no bleeding, drain in place collecting sanguine colored discharge. Skin: no rashes, lesions, ulcers. No induration Neurologic: CN 2-12 grossly intact. Sensation intact, DTR normal. Strength 5/5 in all 4.  Psychiatric: Arousable, in pain.    Labs on Admission: I have personally reviewed following labs and imaging studies  CBC: Recent Labs  Lab 11/08/20 0145  WBC 12.4*  HGB 10.2*  HCT 29.9*  MCV 79.7*  PLT 625*   Basic Metabolic Panel: Recent Labs  Lab 11/07/20 0305 11/08/20 0145 11/09/20 0125  NA 135 134* 135  K 3.8 3.8 4.5  CL 102 104 105  CO2 26 23 25   GLUCOSE 158* 118* 116*  BUN 14 9 10   CREATININE 1.02 0.77 0.92  CALCIUM 8.2* 8.1* 8.3*  MG 2.1  --   --    GFR: Estimated Creatinine Clearance: 142.2 mL/min (by C-G formula based on SCr of 0.92 mg/dL). Liver Function Tests: Recent Labs  Lab 11/07/20 0305 11/08/20 0145 11/09/20 0125  AST 40 37 36  ALT 35 30 32  ALKPHOS 136* 123 148*  BILITOT 0.6 0.8 0.7  PROT  5.9* 5.9* 5.9*  ALBUMIN 1.8* 1.8* 1.8*   No results for input(s): LIPASE, AMYLASE in the last 168 hours. No results for input(s): AMMONIA in the last 168 hours. Coagulation Profile: Recent Labs  Lab 11/13/20 0920  INR 1.2   Cardiac Enzymes: No results for input(s): CKTOTAL, CKMB, CKMBINDEX, TROPONINI in the last 168 hours. BNP (last 3 results) No results for input(s): PROBNP in the last 8760 hours. HbA1C: No results for input(s): HGBA1C in the last 72 hours. CBG: Recent Labs  Lab 11/09/20 0608 11/09/20 1127 11/13/20 0819 11/13/20 1017 11/13/20 1232  GLUCAP 112* 99 176* 162* 173*   Lipid Profile: No results for input(s): CHOL, HDL, LDLCALC, TRIG, CHOLHDL, LDLDIRECT in the last 72 hours. Thyroid Function Tests: No results for input(s): TSH,  T4TOTAL, FREET4, T3FREE, THYROIDAB in the last 72 hours. Anemia Panel: No results for input(s): VITAMINB12, FOLATE, FERRITIN, TIBC, IRON, RETICCTPCT in the last 72 hours. Urine analysis:    Component Value Date/Time   COLORURINE YELLOW 11/13/2020 0830   APPEARANCEUR HAZY (A) 11/13/2020 0830   LABSPEC 1.017 11/13/2020 0830   PHURINE 5.0 11/13/2020 0830   GLUCOSEU 50 (A) 11/13/2020 0830   HGBUR MODERATE (A) 11/13/2020 0830   BILIRUBINUR NEGATIVE 11/13/2020 0830   KETONESUR NEGATIVE 11/13/2020 0830   PROTEINUR 100 (A) 11/13/2020 0830   NITRITE NEGATIVE 11/13/2020 0830   LEUKOCYTESUR NEGATIVE 11/13/2020 0830    Radiological Exams on Admission: No results found.  EKG: Independently reviewed.  Postop EKG showed sinus rhythm with frequent PVCs.  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 (For example, if patient is on BP meds at home and you resume or decide to hold them, it is a problem that needs to be her. Same for CAD, COPD, HLD and so on)  Uncontrolled HTN -Resume home BP meds,  add PRN Hydralazine.  PVD with right lower extremity ischemia and nonhealing ulcer status post BKA -Discussed with vascular PA, recommend start chemical DVT prophylaxis tomorrow.  Vascular surgery will decide further antibiotics use. -Pain control.  Hypoalbuminemia -UA on admission showed >100 mg/dl protein, suspect nephritis versus nephrotic syndrome.  Send a urine protein/creatinine level, indicated for more stringent BP control.  IDDM -Resume Lantus plus sliding scale regimen.  DVT prophylaxis: Lovenox starting tomorrow Code Status: Full code Family Communication: None at bedside Disposition Plan: Expect 1-2 days hospital stay as per vascular surgery. Consults called: Vascular surgeon Admission status: Tele admit   Emeline General MD [facility] Pager [phone]  11/13/2020, 1:22 PM

## 2020-11-13 NOTE — Transfer of Care (Signed)
Immediate Anesthesia Transfer of Care Note  Patient: Roy Taylor  Procedure(s) Performed: RIGHT BELOW KNEE AMPUTATION (Right Leg Lower)  Patient Location: PACU  Anesthesia Type:GA combined with regional for post-op pain  Level of Consciousness: awake and drowsy  Airway & Oxygen Therapy: Patient Spontanous Breathing and Patient connected to nasal cannula oxygen  Post-op Assessment: Report given to RN and Post -op Vital signs reviewed and stable  Post vital signs: Reviewed and stable  Last Vitals:  Vitals Value Taken Time  BP 121/83 11/13/20 1225  Temp    Pulse 83 11/13/20 1230  Resp 23 11/13/20 1230  SpO2 98 % 11/13/20 1230  Vitals shown include unvalidated device data.  Last Pain:  Vitals:   11/13/20 1000  TempSrc:   PainSc: 0-No pain      Patients Stated Pain Goal: 3 (11/13/20 0930)  Complications: No complications documented.

## 2020-11-13 NOTE — Progress Notes (Signed)
Patient had repeated high blood pressures, notified MD Chipper Herb he ordered prn hydralazine, and then Norvasc, and coreg. Mediciness given and will continue to monitor by night nurse.

## 2020-11-13 NOTE — Plan of Care (Signed)
  Problem: Education: Goal: Required Educational Video(s) Outcome: Progressing   Problem: Clinical Measurements: Goal: Postoperative complications will be avoided or minimized Outcome: Progressing   Problem: Skin Integrity: Goal: Demonstration of wound healing without infection will improve Outcome: Progressing   

## 2020-11-13 NOTE — Progress Notes (Signed)
Inpatient Rehab Admissions:  Inpatient Rehab Consult received.  Based on current payor trends, [employer] will not approve this diagnosis for CIR.  Would defer to PT/OT recommendations for alternative venue for rehab.   Signed: Estill Dooms, PT, DPT Admissions Coordinator [phone] 11/13/20  1:41 PM

## 2020-11-13 NOTE — Anesthesia Procedure Notes (Signed)
Procedure Name: LMA Insertion Date/Time: 11/13/2020 10:30 AM Performed by: Epifanio Lesches, CRNA Pre-anesthesia Checklist: Patient identified, Emergency Drugs available, Suction available and Patient being monitored Patient Re-evaluated:Patient Re-evaluated prior to induction Oxygen Delivery Method: Circle System Utilized Preoxygenation: Pre-oxygenation with 100% oxygen Induction Type: IV induction LMA: LMA inserted LMA Size: 5.0 Number of attempts: 1 Airway Equipment and Method: Bite block Placement Confirmation: positive ETCO2 Tube secured with: Tape Dental Injury: Teeth and Oropharynx as per pre-operative assessment

## 2020-11-13 NOTE — Anesthesia Procedure Notes (Signed)
Anesthesia Regional Block: Adductor canal block   Pre-Anesthetic Checklist: ,, timeout performed, Correct Patient, Correct Site, Correct Laterality, Correct Procedure, Correct Position, site marked, Risks and benefits discussed,  Surgical consent,  Pre-op evaluation,  At surgeon's request and post-op pain management  Laterality: Right  Prep: chloraprep       Needles:  Injection technique: Single-shot  Needle Type: Echogenic Needle     Needle Length: 9cm  Needle Gauge: 21     Additional Needles:   Procedures:,,,, ultrasound used (permanent image in chart),,,,  Narrative:  Start time: 11/13/2020 9:52 AM End time: 11/13/2020 9:57 AM Injection made incrementally with aspirations every 5 mL.  Performed by: Personally  Anesthesiologist: Marcene Duos, MD

## 2020-11-13 NOTE — Anesthesia Procedure Notes (Signed)
Anesthesia Regional Block: Popliteal block   Pre-Anesthetic Checklist: ,, timeout performed, Correct Patient, Correct Site, Correct Laterality, Correct Procedure, Correct Position, site marked, Risks and benefits discussed,  Surgical consent,  Pre-op evaluation,  At surgeon's request and post-op pain management  Laterality: Right  Prep: chloraprep       Needles:  Injection technique: Single-shot  Needle Type: Echogenic Stimulator Needle     Needle Length: 9cm  Needle Gauge: 21     Additional Needles:   Procedures:, nerve stimulator,,, ultrasound used (permanent image in chart),,,,   Nerve Stimulator or Paresthesia:  Response: plantar flexion and foot eversion, 1 mA,   Additional Responses:   Narrative:  Start time: 11/13/2020 9:45 AM End time: 11/13/2020 9:52 AM Injection made incrementally with aspirations every 5 mL.  Performed by: Personally  Anesthesiologist: Marcene Duos, MD

## 2020-11-13 NOTE — Interval H&P Note (Signed)
History and Physical Interval Note:  11/13/2020 10:14 AM  Roy Taylor  has presented today for surgery, with the diagnosis of NON-HEALING RIGHT TOE AMPUTATION SITE.  The various methods of treatment have been discussed with the patient and family. After consideration of risks, benefits and other options for treatment, the patient has consented to  Procedure(s): RIGHT BELOW KNEE AMPUTATION (Right) as a surgical intervention.  The patient's history has been reviewed, patient examined, no change in status, stable for surgery.  I have reviewed the patient's chart and labs.  Questions were answered to the patient's satisfaction.     Fabienne Bruns

## 2020-11-13 NOTE — Anesthesia Postprocedure Evaluation (Signed)
Anesthesia Post Note  Patient: Roy Taylor  Procedure(s) Performed: RIGHT BELOW KNEE AMPUTATION (Right Leg Lower)     Patient location during evaluation: PACU Anesthesia Type: General Level of consciousness: awake and alert Pain management: pain level controlled Vital Signs Assessment: post-procedure vital signs reviewed and stable Respiratory status: spontaneous breathing, nonlabored ventilation, respiratory function stable and patient connected to nasal cannula oxygen Cardiovascular status: blood pressure returned to baseline and stable Postop Assessment: no apparent nausea or vomiting Anesthetic complications: no   No complications documented.  Last Vitals:  Vitals:   11/13/20 1400 11/13/20 1415  BP: (!) 148/98 (!) 161/104  Pulse: 79 87  Resp: 17 (!) 22  Temp:    SpO2: 95% 96%    Last Pain:  Vitals:   11/13/20 1258  TempSrc:   PainSc: Asleep                 Kennieth Rad

## 2020-11-14 ENCOUNTER — Encounter (HOSPITAL_COMMUNITY): Payer: Self-pay | Admitting: Vascular Surgery

## 2020-11-14 DIAGNOSIS — I998 Other disorder of circulatory system: Secondary | ICD-10-CM

## 2020-11-14 DIAGNOSIS — Z89511 Acquired absence of right leg below knee: Secondary | ICD-10-CM

## 2020-11-14 LAB — CBC
HCT: 29 % — ABNORMAL LOW (ref 39.0–52.0)
Hemoglobin: 9.3 g/dL — ABNORMAL LOW (ref 13.0–17.0)
MCH: 26.8 pg (ref 26.0–34.0)
MCHC: 32.1 g/dL (ref 30.0–36.0)
MCV: 83.6 fL (ref 80.0–100.0)
Platelets: 653 10*3/uL — ABNORMAL HIGH (ref 150–400)
RBC: 3.47 MIL/uL — ABNORMAL LOW (ref 4.22–5.81)
RDW: 13 % (ref 11.5–15.5)
WBC: 15.7 10*3/uL — ABNORMAL HIGH (ref 4.0–10.5)
nRBC: 0 % (ref 0.0–0.2)

## 2020-11-14 LAB — BASIC METABOLIC PANEL
Anion gap: 6 (ref 5–15)
BUN: 10 mg/dL (ref 6–20)
CO2: 25 mmol/L (ref 22–32)
Calcium: 8.3 mg/dL — ABNORMAL LOW (ref 8.9–10.3)
Chloride: 104 mmol/L (ref 98–111)
Creatinine, Ser: 0.91 mg/dL (ref 0.61–1.24)
GFR, Estimated: >60 mL/min (ref >60)
Glucose, Bld: 157 mg/dL — ABNORMAL HIGH (ref 70–99)
Potassium: 4.3 mmol/L (ref 3.5–5.1)
Sodium: 135 mmol/L (ref 135–145)

## 2020-11-14 LAB — PROTEIN / CREATININE RATIO, URINE
Creatinine, Urine: 113.12 mg/dL
Protein Creatinine Ratio: 0.52 mg/mg{Cre} — ABNORMAL HIGH (ref 0.00–0.15)
Total Protein, Urine: 59 mg/dL

## 2020-11-14 LAB — GLUCOSE, CAPILLARY
Glucose-Capillary: 132 mg/dL — ABNORMAL HIGH (ref 70–99)
Glucose-Capillary: 139 mg/dL — ABNORMAL HIGH (ref 70–99)
Glucose-Capillary: 122 mg/dL — ABNORMAL HIGH (ref 70–99)
Glucose-Capillary: 127 mg/dL — ABNORMAL HIGH (ref 70–99)

## 2020-11-14 NOTE — Progress Notes (Signed)
Mobility Specialist - Progress Note   11/14/20 1644  Mobility  Activity Stood at bedside  Level of Assistance Standby assist, set-up cues, supervision of patient - no hands on  Assistive Device Front wheel walker  Mobility Response Tolerated well  Mobility performed by Mobility specialist  $Mobility charge 1 Mobility   Pre-mobility: 86 HR, 173/96 BP, 98% SpO2 Post-mobility: 90 HR, 155/98 BP, 95% SpO2  Pt used RW and foot rest of bed in order to stand from bed w/ elevated height. He stood for 3 trials lasting ~45 seconds each. Pt differed any attempt to ambulate. Pt sitting up on edge of bed after mobility, family and RN in room.   Mamie Levers Mobility Specialist Mobility Specialist Phone: [phone]

## 2020-11-14 NOTE — Progress Notes (Signed)
RT placed CPAP at bedside. Patient stated he would place himself on when he is ready. RT instructed patient to have RT called if he needs assistance. RT will monitor as needed.

## 2020-11-14 NOTE — Progress Notes (Signed)
Inpatient Rehab Admissions Coordinator Note:   Per PT recommendations, pt was screened for CIR candidacy by Estill Dooms, PT, DPT.  Note that [facility] will not approve CIR level rehab for a patient with an amputation.  No consult at this time.  Please contact me with questions.   Estill Dooms, PT, DPT [phone] 11/14/20 11:05 AM

## 2020-11-14 NOTE — Evaluation (Signed)
Physical Therapy Evaluation Patient Details Name: Roy Taylor MRN: [id number] DOB: 12-18-1965 Today's Date: 11/14/2020   History of Present Illness  Patient is a 55 y/o male who presents on 11/13/20 for elective right BKA. PMH includes HTN and DM.  Clinical Impression  Patient presents with post surgical deficits s/p above surgery. Pt lives with spouse and reports being independent for ADLs/IADLs and walking PTA. Today, pt requires Min-Mod A for standing attempts from elevated bed height with 1 instance of knee buckling resulting in falling back onto bed with assist. Education re: there ex of right residual limb, positioning and phantom limb pain. Asked RN/tech to order bariatric BSC and discussed transfer technique if pt needs to use bathroom. Highly motivated to want to do things on his own. Would benefit from CIR to maximize independence and mobility prior to return home. Will follow acutely.    Follow Up Recommendations CIR;Supervision for mobility/OOB;Supervision/Assistance - 24 hour    Equipment Recommendations  Wheelchair (measurements PT);Wheelchair cushion (measurements PT);Rolling walker with 5" wheels;3in1 (PT) (bariatric DME)    Recommendations for Other Services Rehab consult     Precautions / Restrictions Precautions Precautions: Fall Precaution Comments: right BKA; JP drain Restrictions Weight Bearing Restrictions: No      Mobility  Bed Mobility Overal bed mobility: Needs Assistance Bed Mobility: Supine to Sit     Supine to sit: Supervision;HOB elevated     General bed mobility comments: HOB very elevated per pt request, but able to get to EOB with use of rail. No dizziness.    Transfers Overall transfer level: Needs assistance Equipment used: Rolling walker (2 wheeled) Transfers: Sit to/from Stand Sit to Stand: Mod assist;Min assist;From elevated surface         General transfer comment: On first attempt, Mod A to rise with pt's knee buckling resulting in  fall back into edge of bed. On other 2 attempts, able to stand from elevated EOB with cues for proper hand placement, technique and therapist stabilizing right knee and for balance. Pt locked out right knee into extension. Deferred transfer to chair this AM as it is too low (pt is 6'3 and 1/2).  Ambulation/Gait             General Gait Details: Unable  Stairs            Wheelchair Mobility    Modified Rankin (Stroke Patients Only)       Balance Overall balance assessment: Needs assistance Sitting-balance support: Feet unsupported;Feet supported;No upper extremity supported Sitting balance-Leahy Scale: Good     Standing balance support: During functional activity Standing balance-Leahy Scale: Poor Standing balance comment: Requires UE support, cues for hip extension and right knee extension in standing. Stood for 2 minutes x2.                             Pertinent Vitals/Pain Pain Assessment: No/denies pain    Home Living Family/patient expects to be discharged to:: Private residence Living Arrangements: Spouse/significant other Available Help at Discharge:  (spouse works) Type of Home: House Home Access: Stairs to enter   [employer] of Steps: 1 small step Home Layout: One level;Laundry or work area in [employer]: None      Prior Function Level of Independence: Independent         Comments: Driving, doing own ADLs, does not do IADLs.     Hand Dominance   Dominant Hand: Right    Extremity/Trunk  Assessment   Upper Extremity Assessment Upper Extremity Assessment: Defer to OT evaluation    Lower Extremity Assessment Lower Extremity Assessment: Generalized weakness;RLE deficits/detail RLE Deficits / Details: Good activation of quad set and SLR as well as knee flexion. No phantom pain reported RLE Sensation: decreased light touch    Cervical / Trunk Assessment Cervical / Trunk Assessment: Normal  Communication    Communication: No difficulties  Cognition Arousal/Alertness: Lethargic Behavior During Therapy: WFL for tasks assessed/performed Overall Cognitive Status: Within Functional Limits for tasks assessed                                 General Comments: Sleepy initially but wakes up towards end of session; highly motivated to stand today.      General Comments General comments (skin integrity, edema, etc.): VSS on RA.    Exercises Amputee Exercises Quad Sets: AROM;Right;5 reps;Supine Hip ABduction/ADduction: AROM;Right;5 reps;Supine Straight Leg Raises: AROM;Right;5 reps;Supine   Assessment/Plan    PT Assessment Patient needs continued PT services  PT Problem List Decreased strength;Decreased range of motion;Decreased mobility;Impaired sensation;Decreased balance;Decreased skin integrity       PT Treatment Interventions Therapeutic exercise;Wheelchair mobility training;DME instruction;Balance training;Gait training;Patient/family education;Therapeutic activities;Functional mobility training    PT Goals (Current goals can be found in the Care Plan section)  Acute Rehab PT Goals Patient Stated Goal: to be able to stand up PT Goal Formulation: With patient Time For Goal Achievement: 11/28/20 Potential to Achieve Goals: Good    Frequency Min 3X/week   Barriers to discharge Decreased caregiver support wife works    Co-evaluation               AM-PAC PT "6 Clicks" Mobility  Outcome Measure Help needed turning from your back to your side while in a flat bed without using bedrails?: A Little Help needed moving from lying on your back to sitting on the side of a flat bed without using bedrails?: A Little Help needed moving to and from a bed to a chair (including a wheelchair)?: A Lot Help needed standing up from a chair using your arms (e.g., wheelchair or bedside chair)?: A Lot Help needed to walk in hospital room?: A Lot Help needed climbing 3-5 steps with  a railing? : Total 6 Click Score: 13    End of Session Equipment Utilized During Treatment: Gait belt Activity Tolerance: Patient tolerated treatment well Patient left: in bed;with call bell/phone within reach;with bed alarm set (sitting EOB) Nurse Communication: Mobility status;Other (comment) (need for bari commode) PT Visit Diagnosis: Unsteadiness on feet (R26.81);Muscle weakness (generalized) (M62.81);Other abnormalities of gait and mobility (R26.89)    Time: 4098-1191 PT Time Calculation (min) (ACUTE ONLY): 32 min   Charges:   PT Evaluation $PT Eval Moderate Complexity: 1 Mod PT Treatments $Therapeutic Activity: 8-22 mins        Vale Haven, PT, DPT Acute Rehabilitation Services Pager [phone] Office [phone]      Blake Divine A Lanier Ensign 11/14/2020, 10:17 AM

## 2020-11-14 NOTE — Progress Notes (Signed)
JP drain output 7am - 7 pm 60 cc sanguinous  fluid

## 2020-11-14 NOTE — Evaluation (Signed)
Occupational Therapy Evaluation Patient Details Name: Roy Taylor MRN: [id number] DOB: 01/25/1966 Today's Date: 11/14/2020    History of Present Illness Patient is a 55 y/o male who presents on 11/13/20 for elective right BKA. PMH includes HTN and DM.   Clinical Impression   Pt PTA: Pt living with family and reports intermittent assist available. Pt was independent. Pt currently,  limited by discomfort in RLE, increased need for assist of ADL when standing and increased assist for OOB activity to progress mobility. Pt set-upA to modA for ADL and minguardA overall for sit to stands.  Pt reports discomfort in RLE. Pt would benefit from continued OT skilled services for ADL, mobility and safety. OT following acutely. * anticipate pt to continue to increase functional mobility to need HH at home, dependent upon progress.    Follow Up Recommendations  SNF    Equipment Recommendations  3 in 1 bedside commode (bari)    Recommendations for Other Services       Precautions / Restrictions Precautions Precautions: Fall Precaution Comments: right BKA; JP drain Restrictions Weight Bearing Restrictions: No      Mobility Bed Mobility Overal bed mobility: Needs Assistance Bed Mobility: Supine to Sit     Supine to sit: Supervision;HOB elevated     General bed mobility comments: HOB very elevated per pt request, but able to get to EOB with use of rail. No dizziness. Pt staying at EOB.    Transfers Overall transfer level: Needs assistance Equipment used: Rolling walker (2 wheeled) Transfers: Sit to/from Stand Sit to Stand: Min guard;From elevated surface         General transfer comment: RW in front; cues for hand placement    Balance Overall balance assessment: Needs assistance Sitting-balance support: Feet unsupported;Feet supported;No upper extremity supported Sitting balance-Leahy Scale: Good     Standing balance support: During functional activity Standing balance-Leahy  Scale: Poor Standing balance comment: Requires UE support, cues for hip extension and right knee extension in standing. Stood for 30 secs as pt reported to feel fatigued.                           ADL either performed or assessed with clinical judgement   ADL Overall ADL's : Needs assistance/impaired Eating/Feeding: Set up;Bed level   Grooming: Set up;Sitting   Upper Body Bathing: Set up;Sitting   Lower Body Bathing: Maximal assistance;Sit to/from stand   Upper Body Dressing : Set up;Sitting   Lower Body Dressing: Moderate assistance;Sitting/lateral leans;Sit to/from stand   Toilet Transfer: Maximal assistance;+2 for physical assistance;+2 for safety/equipment Toilet Transfer Details (indicate cue type and reason): to be determined         Functional mobility during ADLs: Minimal assistance;Rolling walker General ADL Comments: Pt limited by discomfort in RLE, increased need for assist of ADL when standing and increased assist for OOB activity to progress mobility.     Vision Baseline Vision/History: Wears glasses Wears Glasses: At all times Patient Visual Report: No change from baseline Vision Assessment?: No apparent visual deficits     Perception     Praxis      Pertinent Vitals/Pain Pain Assessment: No/denies pain     Hand Dominance Right   Extremity/Trunk Assessment Upper Extremity Assessment Upper Extremity Assessment: Generalized weakness   Lower Extremity Assessment Lower Extremity Assessment: Overall WFL for tasks assessed RLE Deficits / Details: no phantom limb pain   Cervical / Trunk Assessment Cervical / Trunk Assessment: Normal  Communication Communication Communication: No difficulties   Cognition Arousal/Alertness: Awake/alert Behavior During Therapy: WFL for tasks assessed/performed Overall Cognitive Status: Within Functional Limits for tasks assessed                                 General Comments: Pt appears  very short with OTR and reports no complaints, Pt unable to hop at this time to [facility] or recliner. Pt can stand, but difficulty trying to hop towards HOB. Education for limb desensitization performed.   General Comments  VSS on RA    Exercises     Shoulder Instructions      Home Living Family/patient expects to be discharged to:: Private residence Living Arrangements: Spouse/significant other Available Help at Discharge: Family;Available PRN/intermittently Type of Home: House Home Access: Stairs to enter Entrance Stairs-Number of Steps: 1 small step   Home Layout: One level;Laundry or work area in basement     [employer] Shower/Tub: Chief Strategy Officer: [employer]: None          Prior Functioning/Environment Level of Independence: Independent        Comments: Driving, doing own ADLs, does not do IADLs.        OT Problem List: Decreased range of motion;Impaired balance (sitting and/or standing);Decreased activity tolerance;Pain;Increased edema;Decreased knowledge of use of DME or AE;Obesity;Decreased safety awareness;Decreased strength      OT Treatment/Interventions: Self-care/ADL training;Therapeutic exercise;Energy conservation;DME and/or AE instruction;Therapeutic activities;Patient/family education;Balance training    OT Goals(Current goals can be found in the care plan section) Acute Rehab OT Goals Patient Stated Goal: to be able to stand up OT Goal Formulation: With patient Time For Goal Achievement: 11/28/20 Potential to Achieve Goals: Good ADL Goals Pt Will Perform Grooming: with min guard assist;standing Pt Will Transfer to Toilet: with min assist;stand pivot transfer;bedside commode Pt Will Perform Toileting - Clothing Manipulation and hygiene: with min assist;sit to/from stand Additional ADL Goal #1: Pt will stand x3 mins for increasing endurance for OOB ADL. Additional ADL Goal #2: Pt will perform sit to stands using RW  with supervisionA  OT Frequency: Min 2X/week   Barriers to D/C:            Co-evaluation              AM-PAC OT "6 Clicks" Daily Activity     Outcome Measure Help from another person eating meals?: None Help from another person taking care of personal grooming?: A Little Help from another person toileting, which includes using toliet, bedpan, or urinal?: A Lot Help from another person bathing (including washing, rinsing, drying)?: A Lot Help from another person to put on and taking off regular upper body clothing?: A Little Help from another person to put on and taking off regular lower body clothing?: A Little 6 Click Score: 17   End of Session Equipment Utilized During Treatment: Rolling walker Nurse Communication: Mobility status;Weight bearing status  Activity Tolerance: Patient limited by fatigue Patient left: in bed;with call bell/phone within reach;with bed alarm set  OT Visit Diagnosis: Unsteadiness on feet (R26.81);Muscle weakness (generalized) (M62.81);Pain Pain - Right/Left: Right Pain - part of body: Leg                Time: 1330-1350 OT Time Calculation (min): 20 min Charges:  OT General Charges $OT Visit: 1 Visit OT Evaluation $OT Eval Moderate Complexity: 1 Mod  Ermine Spofford C, OTR/L Acute  Rehabilitation Services Pager: [phone] Office: [phone]   Flora Lipps 11/14/2020, 5:12 PM

## 2020-11-14 NOTE — Progress Notes (Signed)
RT placed patient on CPAP HS . Patient tolerating well at this time. 

## 2020-11-14 NOTE — Progress Notes (Addendum)
[facility] [place]  Subjective  - No new complaints   Objective (!) 161/107 82 97.7 F (36.5 C) (Oral) 20 98%  Intake/Output Summary (Last 24 hours) at 11/14/2020 0731 Last data filed at 11/14/2020 0247 Gross per 24 hour  Intake 1830 ml  Output 1430 ml  Net 400 ml    Right BKA dressing clean and dry JP 100 cc OP will maintain   Assessment/Planning: POD # 1 right BKA  Plan for dressing change tomorrow Maintain drain  PT/OT  Mosetta Pigeon 11/14/2020 7:31 AM --  Minimal pain.  Change dressing tomorrow.  Will need to look at Rehab or SNF in [place] area.  Fabienne Bruns, MD [facility] [place] Office: [phone]   Laboratory Lab Results: Recent Labs    11/13/20 1441 11/13/20 2137 11/14/20 0126  WBC 13.9*  --  15.7*  HGB 9.2* 9.6* 9.3*  HCT 28.9* 29.6* 29.0*  PLT 660*  --  653*   BMET Recent Labs    11/13/20 1441 11/14/20 0126  NA  --  135  K  --  4.3  CL  --  104  CO2  --  25  GLUCOSE  --  157*  BUN  --  10  CREATININE 0.88 0.91  CALCIUM  --  8.3*    COAG Lab Results  Component Value Date   INR 1.2 11/13/2020   No results found for: PTT

## 2020-11-14 NOTE — Progress Notes (Signed)
PROGRESS NOTE    Roy Taylor  PYK:[id number] DOB: 02-11-1966 DOA: 11/13/2020 PCP: Renaldo Harrison, DO   Brief Narrative: 55 year old male with significant history of PVD right lower extremity ischemia with nonhealing ulcer, recent great toe amputation 2 weeks ago at [facility], IDDM, HTN, HLD, OSA who is a status post elective right BKA 3/28 and admitted under TRH postoperatively.  Subjective: Seen and examined. Overnight no fever, patient has leukocytosis.  Vascular saw the patient this morning. He did work some physical therapy and was resting comfortably on the edge of the bed.  Assessment & Plan:  PVD right lower extremity ischemia with nonhealing ulcer Recent great toe amputation 2 weeks ago at [facility] S/p Elective BKA 3/28: JP drain in place-with serosanguineous output,vascular surgery following noted plan for dressing change tomorrow.  Continue PT, OT, IS. Continue pain control per vascular.  IDDM: Blood sugar fairly controlled.  Hemoglobin A1c 9.93/19.  Continue 35 units nightly Lantus along with sliding scale insulin. Recent Labs  Lab 11/13/20 1017 11/13/20 1232 11/13/20 1617 11/13/20 2106 11/14/20 0603  GLUCAP 162* 173* 264* 183* 132*   HTN: Blood pressure controlled continue Coreg, amlodipine and lisinopril  HLD: Continue his Crestor Hyperalbuminemia/Proteinuria: Follow-up labs OSA Morbid Obesity BMI 39:Will benefit with weight loss and PCP follow-up  Diet Order            Diet heart healthy/carb modified Room service appropriate? Yes; Fluid consistency: Thin  Diet effective now                 Patient's Body mass index is 39.47 kg/m.  DVT prophylaxis: enoxaparin (LOVENOX) injection 40 mg Start: 11/14/20 1000 SCD's Start: 11/13/20 1328 Code Status:   Code Status: Full Code  Family Communication: plan of care discussed with patient at bedside.  Status is: Inpatient  Remains inpatient appropriate because:Inpatient level of care appropriate due  to severity of illness   Dispo: The patient is from: Home              Anticipated d/c is to: TBD              Patient currently is not medically stable to d/c.   Difficult to place patient No   Unresulted Labs (From admission, onward)          Start     Ordered   11/14/20 0500  Basic metabolic panel  Daily,   R      11/13/20 1328   11/14/20 0500  CBC  Daily,   R      11/13/20 1328   11/13/20 1330  Protein / creatinine ratio, urine  Once,   R        11/13/20 1329         Medications reviewed:  Scheduled Meds: . amLODipine  5 mg Oral Daily  . aspirin EC  81 mg Oral Daily  . carvedilol  6.25 mg Oral BID WC  . dapagliflozin propanediol  10 mg Oral Daily  . docusate sodium  100 mg Oral Daily  . DULoxetine  30 mg Oral Daily  . enoxaparin (LOVENOX) injection  40 mg Subcutaneous Q24H  . insulin aspart  0-15 Units Subcutaneous TID WC  . insulin aspart  0-5 Units Subcutaneous QHS  . insulin detemir  35 Units Subcutaneous QHS  . lisinopril  40 mg Oral Daily  . metformin  1,000 mg Oral BID WC  . pantoprazole  40 mg Oral Daily  . rosuvastatin  5 mg Oral Daily  Continuous Infusions:  Consultants:see note  Procedures:see note  Antimicrobials: Anti-infectives (From admission, onward)   Start     Dose/Rate Route Frequency Ordered Stop   11/13/20 1200  ceFAZolin (ANCEF) 3 g in dextrose 5 % 50 mL IVPB        3 g 100 mL/hr over 30 Minutes Intravenous 30 min pre-op 11/13/20 0829 11/13/20 1055     Culture/Microbiology    Component Value Date/Time   SDES BLOOD RIGHT HAND 11/04/2020 0821   SPECREQUEST  11/04/2020 6962    BOTTLES DRAWN AEROBIC AND ANAEROBIC Blood Culture results may not be optimal due to an inadequate volume of blood received in culture bottles   CULT  11/04/2020 0821    NO GROWTH 5 DAYS Performed at [facility], [address], [place], [place] [address]    REPTSTATUS 11/09/2020 FINAL 11/04/2020 1324    Other culture-see  note  Objective: Vitals: Today's Vitals   11/13/20 2130 11/13/20 2305 11/14/20 0025 11/14/20 0416  BP: (!) 154/98 (!) 152/97  (!) 161/107  Pulse: 87 78 83 82  Resp: 14 12 16 20   Temp:  97.6 F (36.4 C)  97.7 F (36.5 C)  TempSrc:  Oral  Oral  SpO2: 96% 98% 96% 98%  Weight:      Height:      PainSc:        Intake/Output Summary (Last 24 hours) at 11/14/2020 0758 Last data filed at 11/14/2020 0247 Gross per 24 hour  Intake 1830 ml  Output 1430 ml  Net 400 ml   Filed Weights   11/13/20 0823  Weight: (!) 145.2 kg   Weight change:   Intake/Output from previous day: 03/28 0701 - 03/29 0700 In: 1830 [P.O.:480; I.V.:1000; IV Piggyback:350] Out: 1430 [Urine:1280; Drains:100; Blood:50] Intake/Output this shift: No intake/output data recorded. Filed Weights   11/13/20 0823  Weight: (!) 145.2 kg    Examination: General exam: AAOx3 ,NAD, weak appearing. HEENT:Oral mucosa moist, Ear/Nose WNL grossly,dentition normal. Respiratory system: bilaterally diminished,no use of accessory muscle, non tender. Cardiovascular system: S1 & S2 +, regular, No JVD. Gastrointestinal system: Abdomen soft, NT,ND, BS+. Nervous System:Alert, awake, moving extremities and grossly nonfocal Extremities: No edema, distal peripheral pulses palpable.  Skin: No rashes,no icterus. MSK: Right BKA stump with dressing in place and JP drain present normal muscle bulk,tone, power  Data Reviewed: I have personally reviewed following labs and imaging studies CBC: Recent Labs  Lab 11/08/20 0145 11/13/20 1441 11/13/20 2137 11/14/20 0126  WBC 12.4* 13.9*  --  15.7*  HGB 10.2* 9.2* 9.6* 9.3*  HCT 29.9* 28.9* 29.6* 29.0*  MCV 79.7* 82.8  --  83.6  PLT 625* 660*  --  653*   Basic Metabolic Panel: Recent Labs  Lab 11/08/20 0145 11/09/20 0125 11/13/20 1441 11/14/20 0126  NA 134* 135  --  135  K 3.8 4.5  --  4.3  CL 104 105  --  104  CO2 23 25  --  25  GLUCOSE 118* 116*  --  157*  BUN 9 10  --  10   CREATININE 0.77 0.92 0.88 0.91  CALCIUM 8.1* 8.3*  --  8.3*   GFR: Estimated Creatinine Clearance: 143.7 mL/min (by C-G formula based on SCr of 0.91 mg/dL). Liver Function Tests: Recent Labs  Lab 11/08/20 0145 11/09/20 0125  AST 37 36  ALT 30 32  ALKPHOS 123 148*  BILITOT 0.8 0.7  PROT 5.9* 5.9*  ALBUMIN 1.8* 1.8*   No results for  input(s): LIPASE, AMYLASE in the last 168 hours. No results for input(s): AMMONIA in the last 168 hours. Coagulation Profile: Recent Labs  Lab 11/13/20 0920  INR 1.2   Cardiac Enzymes: No results for input(s): CKTOTAL, CKMB, CKMBINDEX, TROPONINI in the last 168 hours. BNP (last 3 results) No results for input(s): PROBNP in the last 8760 hours. HbA1C: No results for input(s): HGBA1C in the last 72 hours. CBG: Recent Labs  Lab 11/13/20 1017 11/13/20 1232 11/13/20 1617 11/13/20 2106 11/14/20 0603  GLUCAP 162* 173* 264* 183* 132*   Lipid Profile: No results for input(s): CHOL, HDL, LDLCALC, TRIG, CHOLHDL, LDLDIRECT in the last 72 hours. Thyroid Function Tests: No results for input(s): TSH, T4TOTAL, FREET4, T3FREE, THYROIDAB in the last 72 hours. Anemia Panel: No results for input(s): VITAMINB12, FOLATE, FERRITIN, TIBC, IRON, RETICCTPCT in the last 72 hours. Sepsis Labs: No results for input(s): PROCALCITON, LATICACIDVEN in the last 168 hours.  Recent Results (from the past 240 hour(s))  Culture, blood (routine x 2)     Status: None   Collection Time: 11/04/20  8:20 AM   Specimen: BLOOD  Result Value Ref Range Status   Specimen Description BLOOD LEFT ANTECUBITAL  Final   Special Requests   Final    BOTTLES DRAWN AEROBIC AND ANAEROBIC Blood Culture results may not be optimal due to an inadequate volume of blood received in culture bottles   Culture   Final    NO GROWTH 5 DAYS Performed at [facility], [address] [place], [place] [address]    Report Status 11/09/2020 FINAL  Final  Culture, blood (routine x 2)     Status:  None   Collection Time: 11/04/20  8:21 AM   Specimen: BLOOD RIGHT HAND  Result Value Ref Range Status   Specimen Description BLOOD RIGHT HAND  Final   Special Requests   Final    BOTTLES DRAWN AEROBIC AND ANAEROBIC Blood Culture results may not be optimal due to an inadequate volume of blood received in culture bottles   Culture   Final    NO GROWTH 5 DAYS Performed at [facility], [address] [place], [place] [address]    Report Status 11/09/2020 FINAL  Final  SARS CORONAVIRUS 2 (TAT 6-24 HRS) Nasopharyngeal Nasopharyngeal Swab     Status: None   Collection Time: 11/09/20 12:18 PM   Specimen: Nasopharyngeal Swab  Result Value Ref Range Status   SARS Coronavirus 2 NEGATIVE NEGATIVE Final    Comment: (NOTE) SARS-CoV-2 target nucleic acids are NOT DETECTED.  The SARS-CoV-2 RNA is generally detectable in upper and lower respiratory specimens during the acute phase of infection. Negative results do not preclude SARS-CoV-2 infection, do not rule out co-infections with other pathogens, and should not be used as the sole basis for treatment or other patient management decisions. Negative results must be combined with clinical observations, patient history, and epidemiological information. The expected result is Negative.  Fact Sheet for Patients: HairSlick.no  Fact Sheet for Healthcare Providers: quierodirigir.com  This test is not yet approved or cleared by the [place] FDA and  has been authorized for detection and/or diagnosis of SARS-CoV-2 by FDA under an Emergency Use Authorization (EUA). This EUA will remain  in effect (meaning this test can be used) for the duration of the COVID-19 declaration under Se ction 564(b)(1) of the Act, 21 U.S.C. section 360bbb-3(b)(1), unless the authorization is terminated or revoked sooner.  Performed at [facility], [address] [place], [place] [address]       Radiology Studies: No results found.   LOS: 1 day   Lanae Boastamesh Evarose Altland, MD [facility]  11/14/2020, 7:58 AM

## 2020-11-15 DIAGNOSIS — I998 Other disorder of circulatory system: Secondary | ICD-10-CM | POA: Diagnosis not present

## 2020-11-15 LAB — GLUCOSE, CAPILLARY
Glucose-Capillary: 85 mg/dL (ref 70–99)
Glucose-Capillary: 105 mg/dL — ABNORMAL HIGH (ref 70–99)
Glucose-Capillary: 104 mg/dL — ABNORMAL HIGH (ref 70–99)

## 2020-11-15 LAB — CBC
HCT: 27.7 % — ABNORMAL LOW (ref 39.0–52.0)
Hemoglobin: 9.1 g/dL — ABNORMAL LOW (ref 13.0–17.0)
MCH: 27.1 pg (ref 26.0–34.0)
MCHC: 32.9 g/dL (ref 30.0–36.0)
MCV: 82.4 fL (ref 80.0–100.0)
Platelets: 641 10*3/uL — ABNORMAL HIGH (ref 150–400)
RBC: 3.36 MIL/uL — ABNORMAL LOW (ref 4.22–5.81)
RDW: 13.2 % (ref 11.5–15.5)
WBC: 9.8 10*3/uL (ref 4.0–10.5)
nRBC: 0 % (ref 0.0–0.2)

## 2020-11-15 LAB — BASIC METABOLIC PANEL
Anion gap: 9 (ref 5–15)
BUN: 13 mg/dL (ref 6–20)
CO2: 25 mmol/L (ref 22–32)
Calcium: 8.3 mg/dL — ABNORMAL LOW (ref 8.9–10.3)
Chloride: 101 mmol/L (ref 98–111)
Creatinine, Ser: 0.97 mg/dL (ref 0.61–1.24)
GFR, Estimated: >60 mL/min (ref >60)
Glucose, Bld: 105 mg/dL — ABNORMAL HIGH (ref 70–99)
Potassium: 3.8 mmol/L (ref 3.5–5.1)
Sodium: 135 mmol/L (ref 135–145)

## 2020-11-15 NOTE — Progress Notes (Signed)
Orthopedic Tech Progress Note Patient Details:  Roy Taylor 12-18-1965 [id number] Called in order to [facility] for a RETENTION SOCK Patient ID: [name], male   DOB: 1966-08-10, 55 y.o.   MRN: [id number]   Donald Pore 11/15/2020, 8:36 AM

## 2020-11-15 NOTE — Progress Notes (Addendum)
  Progress Note    11/15/2020 7:59 AM 2 Days Post-Op  Subjective:  No complaints   Vitals:   11/15/20 0322 11/15/20 0747  BP: 140/88 (!) 155/84  Pulse: 86 84  Resp: 19 11  Temp: 98 F (36.7 C) 98.2 F (36.8 C)  SpO2: 91% 99%    Physical Exam: Incisions:  R BKA incision c/d/i; 80cc JP output last 24 hours   CBC    Component Value Date/Time   WBC 9.8 11/15/2020 0257   RBC 3.36 (L) 11/15/2020 0257   HGB 9.1 (L) 11/15/2020 0257   HCT 27.7 (L) 11/15/2020 0257   PLT 641 (H) 11/15/2020 0257   MCV 82.4 11/15/2020 0257   MCH 27.1 11/15/2020 0257   MCHC 32.9 11/15/2020 0257   RDW 13.2 11/15/2020 0257   LYMPHSABS 1.7 11/06/2020 0136   MONOABS 1.2 (H) 11/06/2020 0136   EOSABS 0.1 11/06/2020 0136   BASOSABS 0.1 11/06/2020 0136    BMET    Component Value Date/Time   NA 135 11/15/2020 0257   K 3.8 11/15/2020 0257   CL 101 11/15/2020 0257   CO2 25 11/15/2020 0257   GLUCOSE 105 (H) 11/15/2020 0257   BUN 13 11/15/2020 0257   CREATININE 0.97 11/15/2020 0257   CALCIUM 8.3 (L) 11/15/2020 0257   GFRNONAA >60 11/15/2020 0257    INR    Component Value Date/Time   INR 1.2 11/13/2020 0920     Intake/Output Summary (Last 24 hours) at 11/15/2020 0759 Last data filed at 11/15/2020 0327 Gross per 24 hour  Intake --  Output 1660 ml  Net -1660 ml     Assessment/Plan:  55 y.o. male is s/p right below knee amputation  2 Days Post-Op  - R BKA incision healing well; continue kerlex and ACE today; stump sock ordered for tomorrow - 80cc JP output in last 24h; continue another day - CIR denied based on insurance; will likely need SNF placement   Emilie Rutter, PA-C [facility] [phone] 11/15/2020 7:59 AM   Agree with above.  Pt calling some rehab centers in [place] Keep JP until less than 30 cc 24 hr Can d/c from our standpoint when drain out  Fabienne Bruns, MD [facility] [place] Office: [phone]

## 2020-11-15 NOTE — Progress Notes (Signed)
Mobility Specialist: Progress Note   11/15/20 1636  Mobility  Activity Refused mobility   Pt refused mobility stating PT left a few minutes prior. Will f/u tomorrow.   [facility] Emmarae Cowdery Mobility Specialist Mobility Specialist Phone: [phone]

## 2020-11-15 NOTE — Progress Notes (Signed)
Patient places himself on CPAP without assistance. RT will monitor as needed.  

## 2020-11-15 NOTE — TOC Initial Note (Signed)
Transition of Care [facility]) - Initial/Assessment Note    Patient Details  Name: Roy Taylor MRN: [id number] Date of Birth: [birth year]  Transition of Care [facility]) CM/SW Contact:    Eduard Roux, LCSW Phone Number: 11/15/2020, 11:34 AM  Clinical Narrative:                  CSW visit with patient at bedside. CSW introduced self and explained role. CSW discussed short term rehab at [facility]. Patient states he is agreeable to SNF placement. His preferred SNF is [facility] in [place]. CSW explained the SNF process. Patient states he has received covid vaccines, no booster. Patient states no questions or concerns at this time.  CSW contacted Catha Gosselin Admissions Coordinator- advised faxed clinicals to review for placement. SNF will inform CSW once decision has been determine.   Antony Blackbird, MSW, LCSW Clinical Social Worker   Expected Discharge Plan: Skilled Nursing Facility Barriers to Discharge: Insurance Authorization,Continued Medical Work up,SNF Pending bed offer   Patient Goals and CMS Choice        Expected Discharge Plan and Services Expected Discharge Plan: Skilled Nursing Facility In-house Referral: Clinical Social Work                                            Prior Living Arrangements/Services   Lives with:: Self,Spouse          Need for Family Participation in Patient Care: Yes (Comment) Care giver support system in place?: Yes (comment)      Activities of Daily Living Home Assistive Devices/Equipment: None ADL Screening (condition at time of admission) Patient's cognitive ability adequate to safely complete daily activities?: Yes Is the patient deaf or have difficulty hearing?: No Does the patient have difficulty seeing, even when wearing glasses/contacts?: Yes Does the patient have difficulty concentrating, remembering, or making decisions?: No Patient able to express need for assistance with ADLs?: Yes Does the patient have  difficulty dressing or bathing?: No Independently performs ADLs?: Yes (appropriate for developmental age) Does the patient have difficulty walking or climbing stairs?: Yes Weakness of Legs: Right Weakness of Arms/Hands: None  Permission Sought/Granted Permission sought to share information with : Family Supports Permission granted to share information with : Yes, Verbal Permission Granted              Emotional Assessment Appearance:: Appears stated age   Affect (typically observed): Accepting,Pleasant,Appropriate Orientation: : Oriented to Self,Oriented to Place,Oriented to  Time,Oriented to Situation Alcohol / Substance Use: Not Applicable Psych Involvement: No (comment)  Admission diagnosis:  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 Lower limb ischemia 11/13/2020  . Diabetic foot infection (HCC) 11/04/2020  . PAD (peripheral artery disease) (HCC) 11/04/2020  . Diabetes (HCC) 10/15/2010  . MORBID OBESITY 10/15/2010  . OBSTRUCTIVE SLEEP APNEA 10/15/2010  . HTN (hypertension) 10/15/2010  . CHEST PAIN UNSPECIFIED 10/15/2010   PCP:  Renaldo Harrison, DO Pharmacy:   [facility] [address], [place] - [address] [place] [address] Phone: [phone] Fax: [phone]     Social Determinants of Health (SDOH) Interventions    Readmission Risk Interventions No flowsheet data  found.  

## 2020-11-15 NOTE — Progress Notes (Signed)
Physical Therapy Treatment Patient Details Name: Roy Taylor MRN: [id number] DOB: 1966/05/18 Today's Date: 11/15/2020    History of Present Illness Patient is a 55 y/o male who presents on 11/13/20 for elective right BKA. PMH includes HTN and DM.    PT Comments    Pt was seen for mobility on RW with postural correction of standing, to increase hip ext and to manage his balance.  Pt is able to stand with min guard, and is aware of upright posture despite needing correction to attain it.  Follow for goals of acute PT, and focus on trying to initiate gait, increase standing balance and endurance toward safer and more independent mobility.   Follow Up Recommendations  CIR     Equipment Recommendations  Wheelchair (measurements PT);Wheelchair cushion (measurements PT)    Recommendations for Other Services Rehab consult     Precautions / Restrictions Precautions Precautions: Fall Precaution Comments: right BKA; JP drain Restrictions Weight Bearing Restrictions: No    Mobility  Bed Mobility Overal bed mobility: Needs Assistance Bed Mobility: Supine to Sit     Supine to sit: Supervision;HOB elevated          Transfers Overall transfer level: Needs assistance Equipment used: Rolling walker (2 wheeled) Transfers: Sit to/from Stand Sit to Stand: Min guard;From elevated surface         General transfer comment: RW and prompts for posture and hand placememtn  Ambulation/Gait             General Gait Details: unable   Stairs             Wheelchair Mobility    Modified Rankin (Stroke Patients Only)       Balance Overall balance assessment: Needs assistance Sitting-balance support: Feet supported Sitting balance-Leahy Scale: Good     Standing balance support: During functional activity Standing balance-Leahy Scale: Poor                              Cognition Arousal/Alertness: Awake/alert Behavior During Therapy: WFL for tasks  assessed/performed Overall Cognitive Status: Within Functional Limits for tasks assessed                                 General Comments: sat on side of bed and stood upright on RW      Exercises General Exercises - Lower Extremity Ankle Circles/Pumps: AROM;Left Long Arc Quad: Strengthening Heel Slides: Strengthening;10 reps Hip ABduction/ADduction: AAROM;AROM;10 reps    General Comments        Pertinent Vitals/Pain Pain Assessment: 0-10 Pain Score: 9  Pain Location: R surgery Pain Descriptors / Indicators: Aching;Grimacing Pain Intervention(s): Limited activity within patient's tolerance;Monitored during session;Premedicated before session;Repositioned    Home Living                      Prior Function            PT Goals (current goals can now be found in the care plan section) Progress towards PT goals: Progressing toward goals    Frequency    Min 3X/week      PT Plan Current plan remains appropriate    Co-evaluation              AM-PAC PT "6 Clicks" Mobility   Outcome Measure  Help needed turning from your back to your side while in a flat bed  without using bedrails?: A Little Help needed moving from lying on your back to sitting on the side of a flat bed without using bedrails?: A Little Help needed moving to and from a bed to a chair (including a wheelchair)?: A Little Help needed standing up from a chair using your arms (e.g., wheelchair or bedside chair)?: A Lot Help needed to walk in hospital room?: A Lot Help needed climbing 3-5 steps with a railing? : A Lot 6 Click Score: 15    End of Session Equipment Utilized During Treatment: Gait belt Activity Tolerance: Patient tolerated treatment well Patient left: in bed;with call bell/phone within reach;with bed alarm set Nurse Communication: Mobility status;Other (comment) PT Visit Diagnosis: Unsteadiness on feet (R26.81);Muscle weakness (generalized) (M62.81);Other  abnormalities of gait and mobility (R26.89)     Time: 7672-0947 PT Time Calculation (min) (ACUTE ONLY): 16 min  Charges:  $Therapeutic Activity: 8-22 mins                 Ivar Drape 11/15/2020, 9:41 PM Samul Dada, PT MS Acute Rehab Dept. Number: [facility] [id number] and [facility] [phone]

## 2020-11-15 NOTE — NC FL2 (Addendum)
[place] MEDICAID FL2 LEVEL OF CARE SCREENING TOOL     IDENTIFICATION  Patient Name: Roy Taylor Birthdate: 05/25/1966 Sex: male Admission Date (Current Location): 11/13/2020  [place] and [place] Number:   ([place])   Facility and Address:  [facility], [address], [place], [place] [address]      Provider Number: [id number]  Attending Physician Name and Address:  Lanae Boast, MD  Relative Name and Phone Number:  Alejos Reinhardt    Current Level of Care: Hospital Recommended Level of Care: Skilled Nursing Facility Prior Approval Number:    Date Approved/Denied:   PASRR Number:    Discharge Plan: SNF    Current Diagnoses: Patient Active Problem List   Diagnosis Date Noted  . 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 Lower limb ischemia 11/13/2020  . Diabetic foot infection (HCC) 11/04/2020  . PAD (peripheral artery disease) (HCC) 11/04/2020  . Diabetes (HCC) 10/15/2010  . MORBID OBESITY 10/15/2010  . OBSTRUCTIVE SLEEP APNEA 10/15/2010  . HTN (hypertension) 10/15/2010  . CHEST PAIN UNSPECIFIED 10/15/2010    Orientation RESPIRATION BLADDER Height & Weight     Self,Time,Situation,Place  Normal Continent Weight: (!) 320 lb (145.2 kg) Height:  6' 3.5" (191.8 cm)  BEHAVIORAL SYMPTOMS/MOOD NEUROLOGICAL BOWEL NUTRITION STATUS      Continent Diet (please see discharge summary)  AMBULATORY STATUS COMMUNICATION OF NEEDS Skin   Limited Assist Verbally Surgical wounds (closed incision Rt Leg, wound incision Toe, Rt 5th toe amputaion)                       Personal Care Assistance Level of Assistance  Bathing,Feeding,Dressing Bathing Assistance: Limited assistance Feeding assistance: Independent Dressing Assistance: Limited assistance     Functional Limitations Info  Sight,Hearing,Speech Sight Info: Impaired (wears glasses) Hearing Info: Adequate Speech Info: Adequate    SPECIAL CARE  FACTORS FREQUENCY  PT (By licensed PT),OT (By licensed OT)     PT Frequency: 5x per week OT Frequency: 5x per week            Contractures Contractures Info: Not present    Additional Factors Info  Code Status,Allergies,Psychotropic,Insulin Sliding Scale Code Status Info: FULL Allergies Info: NKA Psychotropic Info: DULoxetine (CYMBALTA) DR capsule 30 mg Insulin Sliding Scale Info: see discharge summary       Current Medications (11/15/2020):  This is the current hospital active medication list Current Facility-Administered Medications  Medication Dose Route Frequency Provider Last Rate Last Admin  . amLODipine (NORVASC) tablet 5 mg  5 mg Oral Daily Mikey College T, MD   5 mg at 11/15/20 0800  . aspirin EC tablet 81 mg  81 mg Oral Daily Mikey College T, MD   81 mg at 11/15/20 0800  . carvedilol (COREG) tablet 6.25 mg  6.25 mg Oral BID WC Mikey College T, MD   6.25 mg at 11/15/20 0748  . dapagliflozin propanediol (FARXIGA) tablet 10 mg  10 mg Oral Daily Mikey College T, MD   10 mg at 11/15/20 0802  . docusate sodium (COLACE) capsule 100 mg  100 mg Oral Daily Rhyne, Samantha J, PA-C   100 mg at 11/15/20 0800  . DULoxetine (CYMBALTA) DR capsule 30 mg  30 mg Oral Daily Mikey College T, MD   30 mg at 11/15/20 0800  . enoxaparin (LOVENOX) injection 40 mg  40 mg Subcutaneous Q24H Mikey College T, MD   40 mg at 11/15/20 0802  .  hydrALAZINE (APRESOLINE) tablet 25 mg  25 mg Oral Q6H PRN Mikey College T, MD   25 mg at 11/14/20 0630  . HYDROmorphone (DILAUDID) injection 0.5-1 mg  0.5-1 mg Intravenous Q2H PRN Mikey College T, MD      . ibuprofen (ADVIL) tablet 600 mg  600 mg Oral Q6H PRN Mikey College T, MD      . insulin aspart (novoLOG) injection 0-15 Units  0-15 Units Subcutaneous TID WC Emeline General, MD   2 Units at 11/14/20 1154  . insulin aspart (novoLOG) injection 0-5 Units  0-5 Units Subcutaneous QHS Mikey College T, MD      . insulin detemir (LEVEMIR) injection 35 Units  35 Units Subcutaneous QHS Emeline General, MD   35 Units at 11/14/20 2118  . lisinopril (ZESTRIL) tablet 40 mg  40 mg Oral Daily Mikey College T, MD   40 mg at 11/15/20 0800  . metFORMIN (GLUCOPHAGE-XR) 24 hr tablet 1,000 mg  1,000 mg Oral BID WC Mikey College T, MD   1,000 mg at 11/15/20 0800  . ondansetron (ZOFRAN) injection 4 mg  4 mg Intravenous Q6H PRN Rhyne, Samantha J, PA-C      . oxyCODONE-acetaminophen (PERCOCET/ROXICET) 5-325 MG per tablet 1-2 tablet  1-2 tablet Oral Q4H PRN Rhyne, [place]es Coupe, PA-C   2 tablet at 11/15/20 0037  . pantoprazole (PROTONIX) EC tablet 40 mg  40 mg Oral Daily Rhyne, Samantha J, PA-C   40 mg at 11/15/20 0800  . phenol (CHLORASEPTIC) mouth spray 1 spray  1 spray Mouth/Throat PRN Rhyne, Samantha J, PA-C      . rosuvastatin (CRESTOR) tablet 5 mg  5 mg Oral Daily Mikey College T, MD   5 mg at 11/15/20 0800     Discharge Medications: Please see discharge summary for a list of discharge medications.  Relevant Imaging Results:  Relevant Lab Results:   Additional Information SSN # [id number]  Paitent states he has received covid vaccine- no booster shot,  Patient uses C-PAP  Eduard Roux, LCSW

## 2020-11-15 NOTE — Progress Notes (Signed)
PROGRESS NOTE    Roy Taylor  VHQ:[id number] DOB: 1965/11/23 DOA: 11/13/2020 PCP: Renaldo Harrison, DO   Brief Narrative: 55 year old male with significant history of PVD right lower extremity ischemia with nonhealing ulcer, recent great toe amputation 2 weeks ago at [facility], IDDM, HTN, HLD, OSA who is a status post elective right BKA 3/28 and admitted under TRH postoperatively. Followed by vascular surgery. PT OT advised CIR  Subjective: Seen and examined this morning. His dressing was changed this morning, no new complaints, pain is controlled Hemoglobin remains a stable.  Afebrile overnight.  Assessment & Plan: PVD right lower extremity ischemia with nonhealing ulcer Recent great toe amputation 2 weeks ago at [facility] S/p Elective BKA 3/28: Continue dressing change today as per vascular wound management/drain management per vascular.  Continue PT OT pain control.  PT OT has advised CIR.    Leukocytosis likely postop reactive.  It has resolved.  IDDM: Sugar is stable.  Hemoglobin A1c 9.9, 3/19.  Continue 35 units nightly Lantus along with sliding scale insulin. Recent Labs  Lab 11/14/20 0603 11/14/20 1154 11/14/20 1629 11/14/20 2022 11/15/20 0619  GLUCAP 132* 139* 122* 127* 85   HTN: Blood pressure well controlled on homeCoreg, amlodipine and lisinopril HLD: Continue his Crestor Hypoalbuminemia/Proteinuria: Protein/creatinine slightly elevated continue his lisinopril. Will need follow-up with PCP or nephrology as outpatient.  OSA-continue nightly CPAP Morbid Obesity BMI 39:Will benefit with weight loss and PCP follow-up  Diet Order            Diet heart healthy/carb modified Room service appropriate? Yes; Fluid consistency: Thin  Diet effective now                 Patient's Body mass index is 39.47 kg/m.  DVT prophylaxis: enoxaparin (LOVENOX) injection 40 mg Start: 11/14/20 1000 SCD's Start: 11/13/20 1328 Code Status:   Code Status: Full Code  Family  Communication: plan of care discussed with patient at bedside.  Status is: Inpatient  Remains inpatient appropriate because:Inpatient level of care appropriate due to severity of illness  Dispo: The patient is from: Home              Anticipated d/c is to: CIR vs SNF              Patient currently is not medically stable to d/c. once okay w/ vascular and placement available.   Difficult to place patient No   Unresulted Labs (From admission, onward)         None     Medications reviewed:  Scheduled Meds: . amLODipine  5 mg Oral Daily  . aspirin EC  81 mg Oral Daily  . carvedilol  6.25 mg Oral BID WC  . dapagliflozin propanediol  10 mg Oral Daily  . docusate sodium  100 mg Oral Daily  . DULoxetine  30 mg Oral Daily  . enoxaparin (LOVENOX) injection  40 mg Subcutaneous Q24H  . insulin aspart  0-15 Units Subcutaneous TID WC  . insulin aspart  0-5 Units Subcutaneous QHS  . insulin detemir  35 Units Subcutaneous QHS  . lisinopril  40 mg Oral Daily  . metformin  1,000 mg Oral BID WC  . pantoprazole  40 mg Oral Daily  . rosuvastatin  5 mg Oral Daily   Continuous Infusions:  Consultants:see note  Procedures:see note  Antimicrobials: Anti-infectives (From admission, onward)   Start     Dose/Rate Route Frequency Ordered Stop   11/13/20 1200  ceFAZolin (ANCEF) 3 g in dextrose  5 % 50 mL IVPB        3 g 100 mL/hr over 30 Minutes Intravenous 30 min pre-op 11/13/20 0829 11/13/20 1055     Culture/Microbiology    Component Value Date/Time   SDES BLOOD RIGHT HAND 11/04/2020 0821   SPECREQUEST  11/04/2020 5188    BOTTLES DRAWN AEROBIC AND ANAEROBIC Blood Culture results may not be optimal due to an inadequate volume of blood received in culture bottles   CULT  11/04/2020 0821    NO GROWTH 5 DAYS Performed at [facility], [address], [place], [place] [address]    REPTSTATUS 11/09/2020 FINAL 11/04/2020 6301    Other culture-see note  Objective: Vitals: Today's  Vitals   11/15/20 0122 11/15/20 0322 11/15/20 0745 11/15/20 0747  BP:  140/88  (!) 155/84  Pulse:  86  84  Resp:  19  11  Temp:  98 F (36.7 C)  98.2 F (36.8 C)  TempSrc:  Oral  Oral  SpO2:  91%  99%  Weight:      Height:      PainSc: Asleep 0-No pain 0-No pain     Intake/Output Summary (Last 24 hours) at 11/15/2020 1128 Last data filed at 11/15/2020 0327 Gross per 24 hour  Intake --  Output 1660 ml  Net -1660 ml   Filed Weights   11/13/20 0823  Weight: (!) 145.2 kg   Weight change:   Intake/Output from previous day: 03/29 0701 - 03/30 0700 In: -  Out: 1660 [Urine:1580; Drains:80] Intake/Output this shift: No intake/output data recorded. Filed Weights   11/13/20 0823  Weight: (!) 145.2 kg    Examination: General exam: AAO , NAD, weak appearing. HEENT:Oral mucosa moist, Ear/Nose WNL grossly, dentition normal. Respiratory system: bilaterally clear, obese,no wheezing or crackles,no use of accessory muscle Cardiovascular system: S1 & S2 +, No JVD,. Gastrointestinal system: Abdomen soft, NT,ND, BS+ Nervous System:Alert, awake, moving extremities and grossly nonfocal Extremities: No edema, distal peripheral pulses palpable.  Skin: No rashes,no icterus. MSK: Normal muscle bulk,tone, power Rt bka stump w/ jp drain and dressing in place.  Data Reviewed: I have personally reviewed following labs and imaging studies CBC: Recent Labs  Lab 11/13/20 1441 11/13/20 2137 11/14/20 0126 11/15/20 0257  WBC 13.9*  --  15.7* 9.8  HGB 9.2* 9.6* 9.3* 9.1*  HCT 28.9* 29.6* 29.0* 27.7*  MCV 82.8  --  83.6 82.4  PLT 660*  --  653* 641*   Basic Metabolic Panel: Recent Labs  Lab 11/09/20 0125 11/13/20 1441 11/14/20 0126 11/15/20 0257  NA 135  --  135 135  K 4.5  --  4.3 3.8  CL 105  --  104 101  CO2 25  --  25 25  GLUCOSE 116*  --  157* 105*  BUN 10  --  10 13  CREATININE 0.92 0.88 0.91 0.97  CALCIUM 8.3*  --  8.3* 8.3*   GFR: Estimated Creatinine Clearance: 134.8  mL/min (by C-G formula based on SCr of 0.97 mg/dL). Liver Function Tests: Recent Labs  Lab 11/09/20 0125  AST 36  ALT 32  ALKPHOS 148*  BILITOT 0.7  PROT 5.9*  ALBUMIN 1.8*   No results for input(s): LIPASE, AMYLASE in the last 168 hours. No results for input(s): AMMONIA in the last 168 hours. Coagulation Profile: Recent Labs  Lab 11/13/20 0920  INR 1.2   Cardiac Enzymes: No results for input(s): CKTOTAL, CKMB, CKMBINDEX, TROPONINI in the last 168 hours. BNP (last  3 results) No results for input(s): PROBNP in the last 8760 hours. HbA1C: No results for input(s): HGBA1C in the last 72 hours. CBG: Recent Labs  Lab 11/14/20 0603 11/14/20 1154 11/14/20 1629 11/14/20 2022 11/15/20 0619  GLUCAP 132* 139* 122* 127* 85   Lipid Profile: No results for input(s): CHOL, HDL, LDLCALC, TRIG, CHOLHDL, LDLDIRECT in the last 72 hours. Thyroid Function Tests: No results for input(s): TSH, T4TOTAL, FREET4, T3FREE, THYROIDAB in the last 72 hours. Anemia Panel: No results for input(s): VITAMINB12, FOLATE, FERRITIN, TIBC, IRON, RETICCTPCT in the last 72 hours. Sepsis Labs: No results for input(s): PROCALCITON, LATICACIDVEN in the last 168 hours.  Recent Results (from the past 240 hour(s))  SARS CORONAVIRUS 2 (TAT 6-24 HRS) Nasopharyngeal Nasopharyngeal Swab     Status: None   Collection Time: 11/09/20 12:18 PM   Specimen: Nasopharyngeal Swab  Result Value Ref Range Status   SARS Coronavirus 2 NEGATIVE NEGATIVE Final    Comment: (NOTE) SARS-CoV-2 target nucleic acids are NOT DETECTED.  The SARS-CoV-2 RNA is generally detectable in upper and lower respiratory specimens during the acute phase of infection. Negative results do not preclude SARS-CoV-2 infection, do not rule out co-infections with other pathogens, and should not be used as the sole basis for treatment or other patient management decisions. Negative results must be combined with clinical observations, patient history,  and epidemiological information. The expected result is Negative.  Fact Sheet for Patients: HairSlick.no  Fact Sheet for Healthcare Providers: quierodirigir.com  This test is not yet approved or cleared by the [place] FDA and  has been authorized for detection and/or diagnosis of SARS-CoV-2 by FDA under an Emergency Use Authorization (EUA). This EUA will remain  in effect (meaning this test can be used) for the duration of the COVID-19 declaration under Se ction 564(b)(1) of the Act, 21 U.S.C. section 360bbb-3(b)(1), unless the authorization is terminated or revoked sooner.  Performed at [facility], [address], [place], [place] [address]      Radiology Studies: No results found.   LOS: 2 days   Lanae Boast, MD [facility]  11/15/2020, 11:28 AM

## 2020-11-16 DIAGNOSIS — S88111A Complete traumatic amputation at level between knee and ankle, right lower leg, initial encounter: Secondary | ICD-10-CM

## 2020-11-16 LAB — GLUCOSE, CAPILLARY
Glucose-Capillary: 105 mg/dL — ABNORMAL HIGH (ref 70–99)
Glucose-Capillary: 129 mg/dL — ABNORMAL HIGH (ref 70–99)
Glucose-Capillary: 89 mg/dL (ref 70–99)
Glucose-Capillary: 93 mg/dL (ref 70–99)

## 2020-11-16 LAB — SURGICAL PATHOLOGY

## 2020-11-16 MED ORDER — AMLODIPINE BESYLATE 10 MG PO TABS
10.0000 mg | ORAL_TABLET | Freq: Every day | ORAL | Status: DC
  Administered 2020-11-16 – 2020-11-20 (×5): 10 mg via ORAL
  Filled 2020-11-16 (×5): qty 1

## 2020-11-16 MED ORDER — ROSUVASTATIN CALCIUM 20 MG PO TABS
40.0000 mg | ORAL_TABLET | Freq: Every day | ORAL | Status: DC
  Administered 2020-11-17 – 2020-11-20 (×4): 40 mg via ORAL
  Filled 2020-11-16 (×4): qty 2

## 2020-11-16 NOTE — Progress Notes (Signed)
Pt statedunsure if he will wear CPAP for the night, he also stated he is comfortable placing self on CPAP.  RT asked pt to have RN call if assistance was needed.

## 2020-11-16 NOTE — Progress Notes (Signed)
Mobility Specialist: Progress Note   11/16/20 1605  Mobility  Activity Refused mobility   After 2 attempts today pt is refusing mobility stating he is feeling depressed today but wishes to keep his thoughts to himself. Offered pt to be taken outside for a bit but pt still refusing. Will f/u tomorrow.   [facility] Tripton Ned Mobility Specialist Mobility Specialist Phone: [phone]

## 2020-11-16 NOTE — Progress Notes (Signed)
[facility]  Subjective  - some pain today, a little depressed   Objective (!) 178/101 98 98.2 F (36.8 C) (Oral) 18 97%  Intake/Output Summary (Last 24 hours) at 11/16/2020 0836 Last data filed at 11/16/2020 0804 Gross per 24 hour  Intake --  Output 5442 ml  Net -5442 ml   Right BKA with some peri incisional ecchymosis no drainage JP 40 serosanguinous  Assessment/Planning: POD #3 right BKA still with increased drain output Most likely d/c drain tomorrow and should be able to d/c from my standpoint tomorrow  Fabienne Bruns 11/16/2020 8:36 AM --  Laboratory Lab Results: Recent Labs    11/14/20 0126 11/15/20 0257  WBC 15.7* 9.8  HGB 9.3* 9.1*  HCT 29.0* 27.7*  PLT 653* 641*   BMET Recent Labs    11/14/20 0126 11/15/20 0257  NA 135 135  K 4.3 3.8  CL 104 101  CO2 25 25  GLUCOSE 157* 105*  BUN 10 13  CREATININE 0.91 0.97  CALCIUM 8.3* 8.3*    COAG Lab Results  Component Value Date   INR 1.2 11/13/2020   No results found for: PTT

## 2020-11-16 NOTE — TOC Progression Note (Signed)
Transition of Care [facility]) - Progression Note    Patient Details  Name: Roy Taylor MRN: [id number] Date of Birth: Jun 12, 1966  Transition of Care [facility]) CM/SW Contact  Eduard Roux, [place] Phone Number: 11/16/2020, 12:05 PM  Clinical Narrative:     CSW attempt to call patient's wife, no answer, unable to leave message. [facility] confirmed bed placement - started insurance authorization today- patient will need insurance authorization before discharged to SNF. Patient has Candise Bowens can take 1-3 days for approval.   CSW visit with patient at bedside. Patient remains agreeable to short term rehab at [facility]. Patient requested covid booster shot before discharge to SNF. Patient states no questions or concerns at this time.   CSW will continue to follow and discharge planning.   Antony Blackbird, MSW, LCSW Clinical Social Worker   Expected Discharge Plan: Skilled Nursing Facility Barriers to Discharge: Insurance Authorization,Continued Medical Work up,SNF Pending bed offer  Expected Discharge Plan and Services Expected Discharge Plan: Skilled Nursing Facility In-house Referral: Clinical Social Work                                             Social Determinants of Health (SDOH) Interventions    Readmission Risk Interventions No flowsheet data found.

## 2020-11-16 NOTE — Progress Notes (Addendum)
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 Followed by vascular surgery. PT OT advised CIR- SW following and based on his insurance plan is for SNF  Subjective: Blood pressure has been trending high. 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  Vascular surgery following, has a drain and dressing in place on the stump. Continue PT OT and pain control.  Plan is for skilled nursing facility.   Leukocytosis likely postop reactive.  It has resolved.  IDDM: Sugar is stable.  Hemoglobin A1c 9.9, 3/19.  Continue 35 units nightly Lantus and SSI Recent Labs  Lab 11/14/20 2022 11/15/20 0619 11/15/20 1619 11/15/20 2128 11/16/20 0649  GLUCAP 127* 85 105* 104* 93   HTN: Borderline controlled this am Cont home Coreg, increase amlodipine to 10 mg, cont home lisinopril.  Pain could be contributing, if remains persistently elevated, we can increase one of the home meds.  Anemia likely from chronic disease.  H&H stable in 9 gms. HLD: on Crestor Hypoalbuminemia/Proteinuria: Protein/creatinine slightly elevated continue his lisinopril. Will need follow-up with PCP or nephrology as outpatient.  OSA-continue nightly CPAP Morbid Obesity BMI 39:Will benefit with weight loss and PCP follow-up  Diet Order            Diet heart healthy/carb modified Room service appropriate? Yes; Fluid consistency: Thin  Diet effective now                  Patient's Body mass index is 39.47 kg/m.  DVT prophylaxis: enoxaparin (LOVENOX) injection 40 mg Start: 11/14/20 1000 SCD's Start: 11/13/20 1328 Code Status:   Code Status: Full Code  Family Communication: plan of care discussed with patient at bedside.  Status is: Inpatient  Remains inpatient appropriate because:Inpatient level of care appropriate due to severity of illness  Dispo: The patient is from: Home              Anticipated d/c is to:SNF once bed available, as early as tomorrow              Patient currently is not medically stable for discharge   Difficult to place patient No   Unresulted Labs (From admission, onward)         None     Medications reviewed:  Scheduled Meds: . amLODipine  5 mg Oral Daily  . aspirin EC  81 mg Oral Daily  . carvedilol  6.25 mg Oral BID WC  . dapagliflozin propanediol  10 mg Oral Daily  . docusate sodium  100 mg Oral Daily  . DULoxetine  30 mg Oral Daily  . enoxaparin (LOVENOX) injection  40 mg Subcutaneous Q24H  . insulin aspart  0-15 Units Subcutaneous TID WC  . insulin aspart  0-5 Units Subcutaneous QHS  . insulin detemir  35 Units Subcutaneous QHS  . lisinopril  40 mg Oral Daily  . metformin  1,000 mg Oral BID WC  . pantoprazole  40 mg Oral Daily  . rosuvastatin  5  mg Oral Daily   Continuous Infusions:  Consultants:see note  Procedures:see note  Antimicrobials: Anti-infectives (From admission, onward)   Start     Dose/Rate Route Frequency Ordered Stop   11/13/20 1200  ceFAZolin (ANCEF) 3 g in dextrose 5 % 50 mL IVPB        3 g 100 mL/hr over 30 Minutes Intravenous 30 min pre-op 11/13/20 0829 11/13/20 1055     Culture/Microbiology    Component Value Date/Time   SDES BLOOD RIGHT HAND 11/04/2020 0821   SPECREQUEST  11/04/2020 8185    BOTTLES DRAWN AEROBIC AND ANAEROBIC Blood Culture results may not be optimal due to an inadequate volume of blood received in culture bottles   CULT  11/04/2020 0821    NO GROWTH 5  DAYS Performed at [facility], [address] [place], [place] [address]    REPTSTATUS 11/09/2020 FINAL 11/04/2020 7026    Other culture-see note  Objective: Vitals: Today's Vitals   11/15/20 2122 11/15/20 2133 11/15/20 2300 11/16/20 0337  BP: (!) 155/79  (!) 172/101 (!) 181/96  Pulse: 90   90  Resp: 18  16 13   Temp: 98.4 F (36.9 C)  98.2 F (36.8 C) 97.8 F (36.6 C)  TempSrc: Oral  Oral Oral  SpO2: 96%  98% 97%  Weight:      Height:      PainSc:  0-No pain 0-No pain     Intake/Output Summary (Last 24 hours) at 11/16/2020 0750 Last data filed at 11/16/2020 0732 Gross per 24 hour  Intake --  Output 4842 ml  Net -4842 ml   Filed Weights   11/13/20 0823  Weight: (!) 145.2 kg   Weight change:   Intake/Output from previous day: 03/30 0701 - 03/31 0700 In: -  Out: 3892 [Urine:3850; Drains:42] Intake/Output this shift: Total I/O In: -  Out: 950 [Urine:950] Filed Weights   11/13/20 0823  Weight: (!) 145.2 kg    Examination: General exam: AAOx3 , NAD, weak appearing. HEENT:Oral mucosa moist, Ear/Nose WNL grossly, dentition normal. Respiratory system: bilaterally clear,no wheezing or crackles,no use of accessory muscle Cardiovascular system: S1 & S2 +, No JVD,. Gastrointestinal system: Abdomen soft, NT,ND, BS+ Nervous System:Alert, awake, moving extremities and grossly nonfocal Extremities: Right AKA stump with dressing in place and drainage+ on JP drain, distal peripheral pulses palpable.  Skin: No rashes,no icterus. MSK: Normal muscle bulk,tone, power  Data Reviewed: I have personally reviewed following labs and imaging studies CBC: Recent Labs  Lab 11/13/20 1441 11/13/20 2137 11/14/20 0126 11/15/20 0257  WBC 13.9*  --  15.7* 9.8  HGB 9.2* 9.6* 9.3* 9.1*  HCT 28.9* 29.6* 29.0* 27.7*  MCV 82.8  --  83.6 82.4  PLT 660*  --  653* 641*   Basic Metabolic Panel: Recent Labs  Lab 11/13/20 1441 11/14/20 0126 11/15/20 0257  NA  --  135 135  K   --  4.3 3.8  CL  --  104 101  CO2  --  25 25  GLUCOSE  --  157* 105*  BUN  --  10 13  CREATININE 0.88 0.91 0.97  CALCIUM  --  8.3* 8.3*   GFR: Estimated Creatinine Clearance: 134.8 mL/min (by C-G formula based on SCr of 0.97 mg/dL). Liver Function Tests: No results for input(s): AST, ALT, ALKPHOS, BILITOT, PROT, ALBUMIN in the last 168 hours. No results for input(s): LIPASE, AMYLASE in the last 168 hours. No results for input(s): AMMONIA in the last 168 hours. Coagulation  Profile: Recent Labs  Lab 11/13/20 0920  INR 1.2   Cardiac Enzymes: No results for input(s): CKTOTAL, CKMB, CKMBINDEX, TROPONINI in the last 168 hours. BNP (last 3 results) No results for input(s): PROBNP in the last 8760 hours. HbA1C: No results for input(s): HGBA1C in the last 72 hours. CBG: Recent Labs  Lab 11/14/20 2022 11/15/20 0619 11/15/20 1619 11/15/20 2128 11/16/20 0649  GLUCAP 127* 85 105* 104* 93   Lipid Profile: No results for input(s): CHOL, HDL, LDLCALC, TRIG, CHOLHDL, LDLDIRECT in the last 72 hours. Thyroid Function Tests: No results for input(s): TSH, T4TOTAL, FREET4, T3FREE, THYROIDAB in the last 72 hours. Anemia Panel: No results for input(s): VITAMINB12, FOLATE, FERRITIN, TIBC, IRON, RETICCTPCT in the last 72 hours. Sepsis Labs: No results for input(s): PROCALCITON, LATICACIDVEN in the last 168 hours.  Recent Results (from the past 240 hour(s))  SARS CORONAVIRUS 2 (TAT 6-24 HRS) Nasopharyngeal Nasopharyngeal Swab     Status: None   Collection Time: 11/09/20 12:18 PM   Specimen: Nasopharyngeal Swab  Result Value Ref Range Status   SARS Coronavirus 2 NEGATIVE NEGATIVE Final    Comment: (NOTE) SARS-CoV-2 target nucleic acids are NOT DETECTED.  The SARS-CoV-2 RNA is generally detectable in upper and lower respiratory specimens during the acute phase of infection. Negative results do not preclude SARS-CoV-2 infection, do not rule out co-infections with other pathogens, and  should not be used as the sole basis for treatment or other patient management decisions. Negative results must be combined with clinical observations, patient history, and epidemiological information. The expected result is Negative.  Fact Sheet for Patients: HairSlick.no  Fact Sheet for Healthcare Providers: quierodirigir.com  This test is not yet approved or cleared by the [place] FDA and  has been authorized for detection and/or diagnosis of SARS-CoV-2 by FDA under an Emergency Use Authorization (EUA). This EUA will remain  in effect (meaning this test can be used) for the duration of the COVID-19 declaration under Se ction 564(b)(1) of the Act, 21 U.S.C. section 360bbb-3(b)(1), unless the authorization is terminated or revoked sooner.  Performed at [facility], [address], [place], [place] [address]      Radiology Studies: No results found.   LOS: 3 days   Lanae Boast, MD [facility]  11/16/2020, 7:50 AM

## 2020-11-17 DIAGNOSIS — S88111A Complete traumatic amputation at level between knee and ankle, right lower leg, initial encounter: Secondary | ICD-10-CM | POA: Diagnosis not present

## 2020-11-17 LAB — LIPID PANEL
Cholesterol: 142 mg/dL (ref 0–200)
HDL: 27 mg/dL — ABNORMAL LOW (ref >40)
LDL Cholesterol: 95 mg/dL (ref 0–99)
Total CHOL/HDL Ratio: 5.3 RATIO
Triglycerides: 99 mg/dL (ref <150)
VLDL: 20 mg/dL (ref 0–40)

## 2020-11-17 LAB — GLUCOSE, CAPILLARY
Glucose-Capillary: 111 mg/dL — ABNORMAL HIGH (ref 70–99)
Glucose-Capillary: 127 mg/dL — ABNORMAL HIGH (ref 70–99)
Glucose-Capillary: 93 mg/dL (ref 70–99)
Glucose-Capillary: 113 mg/dL — ABNORMAL HIGH (ref 70–99)

## 2020-11-17 MED ORDER — COVID-19 MRNA VAC-TRIS(PFIZER) 30 MCG/0.3ML IM SUSP
0.3000 mL | Freq: Once | INTRAMUSCULAR | Status: AC
  Administered 2020-11-17: 0.3 mL via INTRAMUSCULAR
  Filled 2020-11-17: qty 0.3

## 2020-11-17 NOTE — Plan of Care (Signed)
  Problem: Clinical Measurements: Goal: Postoperative complications will be avoided or minimized Outcome: Progressing   Problem: Skin Integrity: Goal: Demonstration of wound healing without infection will improve Outcome: Progressing   

## 2020-11-17 NOTE — Progress Notes (Signed)
Occupational Therapy Treatment Patient Details Name: Roy Taylor MRN: [id number] DOB: 12-Oct-1965 Today's Date: 11/17/2020    History of present illness Patient is a 55 y/o male who presents on 11/13/20 for elective right BKA. PMH includes HTN and DM.   OT comments  Pt making steady progress towards OT goals this session. Pt very flat during session but agreeable to OT intervention. Session focus on functional transfer training and standing tolerance. Pt currently requires MIN A - min guard assist for sit<>stand from EOB and recliner with RW. Pt able to pivot to recliner with min guard assist with RW. Worked on standing tolerance from recliner with pt able to stand ~ 1 min before fatiguing. Pt would continue to benefit from skilled occupational therapy while admitted and after d/c to address the below listed limitations in order to improve overall functional mobility and facilitate independence with BADL participation. DC plan remains appropriate, will follow acutely per POC.     Follow Up Recommendations  SNF    Equipment Recommendations  3 in 1 bedside commode;Other (comment) (bari)    Recommendations for Other Services      Precautions / Restrictions Precautions Precautions: Fall Precaution Comments: right BKA Restrictions Weight Bearing Restrictions: Yes RLE Weight Bearing: Non weight bearing Other Position/Activity Restrictions: s/p R BKA       Mobility Bed Mobility Overal bed mobility: Needs Assistance Bed Mobility: Supine to Sit     Supine to sit: Modified independent (Device/Increase time);HOB elevated     General bed mobility comments: no physical assist needed, +rail    Transfers Overall transfer level: Needs assistance Equipment used: Rolling walker (2 wheeled) Transfers: Sit to/from [employer] Sit to Stand: Min guard;From elevated surface;Min assist Stand pivot transfers: Min assist       General transfer comment: pt completed sit<>stand  from elevated EOB with min guard assist, MINA  for stand pivot transfer from EOB>recliner with MIN A needed for steadying assist, pt able to complete additionally sit<>stand from low recliner wth MIN A, pt able to tolerance static standing ~ 1 min before fatiguing    Balance Overall balance assessment: Needs assistance Sitting-balance support: Feet supported;No upper extremity supported Sitting balance-Leahy Scale: Good Sitting balance - Comments: static sitting EOB   Standing balance support: Bilateral upper extremity supported;During functional activity Standing balance-Leahy Scale: Poor Standing balance comment: reliant on UE support and external assist                           ADL either performed or assessed with clinical judgement   ADL Overall ADL's : Needs assistance/impaired                         Toilet Transfer: Min Camera operator Details (indicate cue type and reason): simulated via functionalmobility,stand pivot transfer from EO>recliner. pt able to take a couple hops but also using heel toe pivot method         Functional mobility during ADLs: Minimal assistance;Rolling walker General ADL Comments: pt continues to be very flat during session, but able to progress OOB to recliner with Rw with MIN A to pivot to recliner, pt additonally able to work on standing tolerance from recliner but needed to sit after 1 min     Vision       Perception     Praxis      Cognition Arousal/Alertness: Awake/alert Behavior During Therapy: Flat affect Overall Cognitive  Status: Within Functional Limits for tasks assessed                                 General Comments: disengaged, minimal eye contact, minimal interaction with therapist        Exercises   Shoulder Instructions       General Comments VSS on RA    Pertinent Vitals/ Pain       Pain Assessment: Faces Faces Pain Scale: Hurts little more Pain  Location: RLE Pain Descriptors / Indicators: Grimacing;Guarding Pain Intervention(s): Limited activity within patient's tolerance;Monitored during session;Repositioned  Home Living                                          Prior Functioning/Environment              Frequency  Min 2X/week        Progress Toward Goals  OT Goals(current goals can now be found in the care plan section)  Progress towards OT goals: Progressing toward goals  Acute Rehab OT Goals Patient Stated Goal: not stated OT Goal Formulation: With patient Time For Goal Achievement: 11/28/20 Potential to Achieve Goals: Good  Plan Discharge plan remains appropriate;Frequency remains appropriate    Co-evaluation                 AM-PAC OT "6 Clicks" Daily Activity     Outcome Measure   Help from another person eating meals?: None Help from another person taking care of personal grooming?: A Little Help from another person toileting, which includes using toliet, bedpan, or urinal?: A Lot Help from another person bathing (including washing, rinsing, drying)?: A Lot Help from another person to put on and taking off regular upper body clothing?: A Little Help from another person to put on and taking off regular lower body clothing?: A Lot 6 Click Score: 16    End of Session Equipment Utilized During Treatment: Gait belt;Rolling walker  OT Visit Diagnosis: Unsteadiness on feet (R26.81);Muscle weakness (generalized) (M62.81);Pain Pain - Right/Left: Right Pain - part of body: Leg   Activity Tolerance Other (comment) (pt participatory but slightly self limiting as pt not communicating with therapist)   Patient Left in chair;with call bell/phone within reach   Nurse Communication Mobility status        Time: 2725-3664 OT Time Calculation (min): 9 min  Charges: OT General Charges $OT Visit: 1 Visit OT Treatments $Therapeutic Activity: 8-22 mins  Lenor Derrick., COTA/L Acute  Rehabilitation Services [phone]   Roy Taylor 11/17/2020, 12:18 PM

## 2020-11-17 NOTE — TOC Progression Note (Signed)
Transition of Care [facility]) - Progression Note    Patient Details  Name: Roy Taylor MRN: [id number] Date of Birth: 30-Sep-1965  [facility]) CM/SW Contact  Eduard Roux, [place] Phone Number: 11/17/2020, 12:51 PM  Clinical Narrative:     Candise Bowens insurance authorization for SNF remains pending   CSW will continue to follow and assist with discharge planning.  Antony Blackbird, MSW, LCSW Clinical Social Worker   Expected Discharge Plan: Skilled Nursing Facility Barriers to Discharge: Insurance Authorization,Continued Medical Work up,SNF Pending bed offer  Expected Discharge Plan and Services Expected Discharge Plan: Skilled Nursing Facility In-house Referral: Clinical Social Work                                             Social Determinants of Health (SDOH) Interventions    Readmission Risk Interventions No flowsheet data found.

## 2020-11-17 NOTE — Progress Notes (Incomplete)
Mobility Specialist: Progress Note   11/17/20 1455  Mobility  Activity Stood at bedside  Level of Designer, industrial/product wheel walker  Mobility Response Tolerated well  Mobility performed by Mobility specialist  $Mobility charge 1 Mobility   Pre-Mobility: 95 HR Post-Mobility: 89 HR  Pt asx while standing EOB  Production designer, theatre/television/film Phone: [phone]

## 2020-11-17 NOTE — Care Management Important Message (Signed)
Important Message  Patient Details  Name: Roy Taylor MRN: [id number] Date of Birth: November 04, 1965   Medicare Important Message Given:  Yes     [name] 11/17/2020, 10:27 AM

## 2020-11-17 NOTE — Progress Notes (Addendum)
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 Followed by vascular surgery. PT OT advised CIR- SW following and based on his insurance plan is for SNF  Subjective: Resting comfortably no new complaints.  Remains afebrile. 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  Plan is for skilled nursing facility.   Leukocytosis likely postop reactive.  Resolved  IDDM: Well controlled.Hemoglobin A1c 9.9, 3/19.  On 35 units nightly Lantus and SSI Recent Labs  Lab 11/16/20 0649 11/16/20 1159 11/16/20 1649 11/16/20 2334 11/17/20 0630  GLUCAP 93 89 105* 129* 111*   HTN: Borderline controlled home amlodipine increased to 10 mg 3/31, continue on current  Coreg and lisinopril.  Pain could be contributing, if remains persistently elevated can consider adjusting further in the next 1-2 days.  Anemia likely from chronic disease.  Overall stable.  CBC in 1 week. HLD: Continue Crestor Hypoalbuminemia/Proteinuria: Protein/creatinine slightly elevated continue his lisinopril. Will need follow-up with PCP or nephrology as outpatient.  OSA-continue nightly CPAP Morbid Obesity BMI 39:Will benefit with weight loss and PCP follow-up Covid vaccinated 5/29 AND 06/06/2020- WITH PFIZER SEEN IN CARE EVERYWHERE HE IS REQUESTING BOOSTER. WE WILL DO ONE  Diet Order             Diet heart healthy/carb modified Room service appropriate? Yes; Fluid consistency: Thin  Diet effective now                 Patient's Body mass index is 39.47 kg/m.  DVT prophylaxis: enoxaparin (LOVENOX) injection 40 mg Start: 11/14/20 1000 SCD's Start: 11/13/20 1328 Code Status:   Code Status: Full Code  Family Communication: plan of care discussed with patient at bedside.  Status is: Inpatient  Remains inpatient appropriate because:Inpatient level of care appropriate due to severity of illness  Dispo: The patient is from: Home              Anticipated d/c is to:SNF once bed available pending insurance authorization.                Patient currently is medically stable for discharge   Difficult to place patient No   Unresulted Labs (From admission, onward)          Start     Ordered   11/17/20 1120  Glucose, capillary  Once,   R        11/17/20 1120         Medications reviewed:  Scheduled Meds: . amLODipine  10 mg Oral Daily  . aspirin EC  81 mg Oral Daily  . carvedilol  6.25 mg Oral BID WC  . dapagliflozin propanediol  10 mg Oral Daily  . docusate sodium  100 mg Oral Daily  . DULoxetine  30 mg Oral Daily  . enoxaparin (LOVENOX) injection  40 mg Subcutaneous Q24H  . insulin aspart  0-15 Units Subcutaneous TID WC  . insulin aspart  0-5  Units Subcutaneous QHS  . insulin detemir  35 Units Subcutaneous QHS  . lisinopril  40 mg Oral Daily  . metformin  1,000 mg Oral BID WC  . pantoprazole  40 mg Oral Daily  . rosuvastatin  40 mg Oral Daily   Continuous Infusions:  Consultants:see note  Procedures:see note  Antimicrobials: Anti-infectives (From admission, onward)   Start     Dose/Rate Route Frequency Ordered Stop   11/13/20 1200  ceFAZolin (ANCEF) 3 g in dextrose 5 % 50 mL IVPB        3 g 100 mL/hr over 30 Minutes Intravenous 30 min pre-op 11/13/20 0829 11/13/20 1055     Culture/Microbiology    Component Value Date/Time   SDES BLOOD RIGHT HAND 11/04/2020  0821   SPECREQUEST  11/04/2020 5329    BOTTLES DRAWN AEROBIC AND ANAEROBIC Blood Culture results may not be optimal due to an inadequate volume of blood received in culture bottles   CULT  11/04/2020 0821    NO GROWTH 5 DAYS Performed at [facility], [address], [place], [place] [address]    REPTSTATUS 11/09/2020 FINAL 11/04/2020 8341    Other culture-see note  Objective: Vitals: Today's Vitals   11/16/20 1930 11/16/20 2336 11/17/20 0627 11/17/20 0749  BP: (!) 174/99 (!) 163/94 (!) 163/95 (!) 159/96  Pulse: 99 94 95 97  Resp: 18 16 10 15   Temp: 98.2 F (36.8 C) 97.8 F (36.6 C) 98.2 F (36.8 C) 98.3 F (36.8 C)  TempSrc: Oral Oral Oral Oral  SpO2: 96% 95% 97% 98%  Weight:      Height:      PainSc: 0-No pain   0-No pain    Intake/Output Summary (Last 24 hours) at 11/17/2020 1141 Last data filed at 11/17/2020 0749 Gross per 24 hour  Intake 360 ml  Output 5830 ml  Net -5470 ml   Filed Weights   11/13/20 0823  Weight: (!) 145.2 kg   Weight change:   Intake/Output from previous day: 03/31 0701 - 04/01 0700 In: 480 [P.O.:480] Out: 8580 [Urine:8550; Drains:30] Intake/Output this shift: Total I/O In: -  Out: 400 [Urine:400] Filed Weights   11/13/20 0823  Weight: (!) 145.2 kg    Examination: General exam: AAOx3, obese,NAD, weak appearing. HEENT:Oral mucosa moist, Ear/Nose WNL grossly, dentition normal. Respiratory system: bilaterally diminishedd,no wheezing or crackles,no use of accessory muscle Cardiovascular system: S1 & S2 +, No JVD,. Gastrointestinal system: Abdomen soft, NT,ND, BS+ Nervous System:Alert, awake, moving extremities and grossly nonfocal Extremities: No edema, distal peripheral pulses palpable.  Right BKA stump of dressing and JP drain, appears intact w/ suture Skin: No rashes,no icterus. MSK: Normal muscle bulk,tone, power  Data Reviewed: I have personally reviewed following labs and imaging studies CBC: Recent Labs  Lab  11/13/20 1441 11/13/20 2137 11/14/20 0126 11/15/20 0257  WBC 13.9*  --  15.7* 9.8  HGB 9.2* 9.6* 9.3* 9.1*  HCT 28.9* 29.6* 29.0* 27.7*  MCV 82.8  --  83.6 82.4  PLT 660*  --  653* 641*   Basic Metabolic Panel: Recent Labs  Lab 11/13/20 1441 11/14/20 0126 11/15/20 0257  NA  --  135 135  K  --  4.3 3.8  CL  --  104 101  CO2  --  25 25  GLUCOSE  --  157* 105*  BUN  --  10 13  CREATININE 0.88 0.91 0.97  CALCIUM  --  8.3* 8.3*   GFR: Estimated Creatinine Clearance: 134.8 mL/min (by  C-G formula based on SCr of 0.97 mg/dL). Liver Function Tests: No results for input(s): AST, ALT, ALKPHOS, BILITOT, PROT, ALBUMIN in the last 168 hours. No results for input(s): LIPASE, AMYLASE in the last 168 hours. No results for input(s): AMMONIA in the last 168 hours. Coagulation Profile: Recent Labs  Lab 11/13/20 0920  INR 1.2   Cardiac Enzymes: No results for input(s): CKTOTAL, CKMB, CKMBINDEX, TROPONINI in the last 168 hours. BNP (last 3 results) No results for input(s): PROBNP in the last 8760 hours. HbA1C: No results for input(s): HGBA1C in the last 72 hours. CBG: Recent Labs  Lab 11/16/20 0649 11/16/20 1159 11/16/20 1649 11/16/20 2334 11/17/20 0630  GLUCAP 93 89 105* 129* 111*   Lipid Profile: Recent Labs    11/17/20 0211  CHOL 142  HDL 27*  LDLCALC 95  TRIG 99  CHOLHDL 5.3   Thyroid Function Tests: No results for input(s): TSH, T4TOTAL, FREET4, T3FREE, THYROIDAB in the last 72 hours. Anemia Panel: No results for input(s): VITAMINB12, FOLATE, FERRITIN, TIBC, IRON, RETICCTPCT in the last 72 hours. Sepsis Labs: No results for input(s): PROCALCITON, LATICACIDVEN in the last 168 hours.  Recent Results (from the past 240 hour(s))  SARS CORONAVIRUS 2 (TAT 6-24 HRS) Nasopharyngeal Nasopharyngeal Swab     Status: None   Collection Time: 11/09/20 12:18 PM   Specimen: Nasopharyngeal Swab  Result Value Ref Range Status   SARS Coronavirus 2 NEGATIVE NEGATIVE Final     Comment: (NOTE) SARS-CoV-2 target nucleic acids are NOT DETECTED.  The SARS-CoV-2 RNA is generally detectable in upper and lower respiratory specimens during the acute phase of infection. Negative results do not preclude SARS-CoV-2 infection, do not rule out co-infections with other pathogens, and should not be used as the sole basis for treatment or other patient management decisions. Negative results must be combined with clinical observations, patient history, and epidemiological information. The expected result is Negative.  Fact Sheet for Patients: HairSlick.no  Fact Sheet for Healthcare Providers: quierodirigir.com  This test is not yet approved or cleared by the [place] FDA and  has been authorized for detection and/or diagnosis of SARS-CoV-2 by FDA under an Emergency Use Authorization (EUA). This EUA will remain  in effect (meaning this test can be used) for the duration of the COVID-19 declaration under Se ction 564(b)(1) of the Act, 21 U.S.C. section 360bbb-3(b)(1), unless the authorization is terminated or revoked sooner.  Performed at [facility], [address], [place], [place] [address]      Radiology Studies: No results found.   LOS: 4 days   Lanae Boast, MD [facility]  11/17/2020, 11:41 AM

## 2020-11-17 NOTE — Progress Notes (Signed)
Patient refused the use of CPAP for the evening.  

## 2020-11-17 NOTE — Progress Notes (Signed)
Physical Therapy Treatment Patient Details Name: Roy Taylor MRN: [id number] DOB: 09-16-1965 Today's Date: 11/17/2020    History of Present Illness Patient is a 55 y/o male who presents on 11/13/20 for elective right BKA. PMH includes HTN and DM.    PT Comments    Pt required supervision bed mobility and demonstrates good sitting balance EOB. Pt declining OOB due to wanting to get washed up. Performed RLE exercises. Educated on importance of maintaining R knee ROM. Discharge recommendation updated to SNF.    Follow Up Recommendations  SNF     Equipment Recommendations  Wheelchair (measurements PT);Wheelchair cushion (measurements PT)    Recommendations for Other Services       Precautions / Restrictions Precautions Precautions: Fall Restrictions Weight Bearing Restrictions: Yes RLE Weight Bearing: Non weight bearing Other Position/Activity Restrictions: s/p R BKA    Mobility  Bed Mobility Overal bed mobility: Needs Assistance Bed Mobility: Supine to Sit     Supine to sit: Supervision;HOB elevated     General bed mobility comments: +rail, increased time    Transfers                 General transfer comment: Pt declining progression beyond EOB due to wanting to get washed up.  Ambulation/Gait                 Stairs             Wheelchair Mobility    Modified Rankin (Stroke Patients Only)       Balance Overall balance assessment: Needs assistance Sitting-balance support: Feet supported;No upper extremity supported Sitting balance-Leahy Scale: Good                                      Cognition Arousal/Alertness: Awake/alert Behavior During Therapy: Flat affect Overall Cognitive Status: Within Functional Limits for tasks assessed                                 General Comments: disengaged, minimal eye contact, minimal interaction with therapist      Exercises Amputee Exercises Quad Sets:  AROM;Right;5 reps Hip ABduction/ADduction: AROM;Right;5 reps Knee Flexion: AROM;Right;5 reps Knee Extension: AROM;Right;5 reps    General Comments General comments (skin integrity, edema, etc.): VSS on RA      Pertinent Vitals/Pain Pain Assessment: Faces Faces Pain Scale: Hurts little more Pain Location: RLE Pain Descriptors / Indicators: Grimacing;Guarding Pain Intervention(s): Limited activity within patient's tolerance;Monitored during session    Home Living                      Prior Function            PT Goals (current goals can now be found in the care plan section) Acute Rehab PT Goals Patient Stated Goal: not stated Progress towards PT goals: Progressing toward goals    Frequency    Min 3X/week      PT Plan Discharge plan needs to be updated    Co-evaluation              AM-PAC PT "6 Clicks" Mobility   Outcome Measure  Help needed turning from your back to your side while in a flat bed without using bedrails?: None Help needed moving from lying on your back to sitting on the side of a flat bed without  using bedrails?: A Little Help needed moving to and from a bed to a chair (including a wheelchair)?: A Little Help needed standing up from a chair using your arms (e.g., wheelchair or bedside chair)?: A Lot Help needed to walk in hospital room?: A Lot Help needed climbing 3-5 steps with a railing? : A Lot 6 Click Score: 16    End of Session   Activity Tolerance: Patient tolerated treatment well Patient left: in bed;with call bell/phone within reach Nurse Communication: Mobility status PT Visit Diagnosis: Unsteadiness on feet (R26.81);Muscle weakness (generalized) (M62.81);Other abnormalities of gait and mobility (R26.89)     Time: 6270-3500 PT Time Calculation (min) (ACUTE ONLY): 10 min  Charges:  $Therapeutic Exercise: 8-22 mins                     Aida Raider, PT  Office # [phone] Pager [phone]    Ilda Foil 11/17/2020, 12:02 PM

## 2020-11-17 NOTE — Progress Notes (Addendum)
  Progress Note    11/17/2020 10:22 AM 4 Days Post-Op  Subjective:  No complaints   Vitals:   11/17/20 0627 11/17/20 0749  BP: (!) 163/95 (!) 159/96  Pulse: 95 97  Resp: 10 15  Temp: 98.2 F (36.8 C) 98.3 F (36.8 C)  SpO2: 97% 98%    Physical Exam: Incisions:  R BKA healing well; minimal JP output in last 24h   CBC    Component Value Date/Time   WBC 9.8 11/15/2020 0257   RBC 3.36 (L) 11/15/2020 0257   HGB 9.1 (L) 11/15/2020 0257   HCT 27.7 (L) 11/15/2020 0257   PLT 641 (H) 11/15/2020 0257   MCV 82.4 11/15/2020 0257   MCH 27.1 11/15/2020 0257   MCHC 32.9 11/15/2020 0257   RDW 13.2 11/15/2020 0257   LYMPHSABS 1.7 11/06/2020 0136   MONOABS 1.2 (H) 11/06/2020 0136   EOSABS 0.1 11/06/2020 0136   BASOSABS 0.1 11/06/2020 0136    BMET    Component Value Date/Time   NA 135 11/15/2020 0257   K 3.8 11/15/2020 0257   CL 101 11/15/2020 0257   CO2 25 11/15/2020 0257   GLUCOSE 105 (H) 11/15/2020 0257   BUN 13 11/15/2020 0257   CREATININE 0.97 11/15/2020 0257   CALCIUM 8.3 (L) 11/15/2020 0257   GFRNONAA >60 11/15/2020 0257    INR    Component Value Date/Time   INR 1.2 11/13/2020 0920     Intake/Output Summary (Last 24 hours) at 11/17/2020 1022 Last data filed at 11/17/2020 0749 Gross per 24 hour  Intake 480 ml  Output 7430 ml  Net -6950 ml     Assessment/Plan:  55 y.o. male is s/p right below knee amputation  4 Days Post-Op  -R BKA incision healing well -JP output minimal; drain removed -Ready for d/c to SNF when insurance approves; call vascular surgeon on call if questions over the weekend   Emilie Rutter, PA-C [facility] [phone] 11/17/2020 10:22 AM  Some peri incisional ecchymosis and edema Will recheck in a few weeks Ok for d/c from my standpoint  Fabienne Bruns, MD [facility] [place] Office: [phone]

## 2020-11-18 DIAGNOSIS — I10 Essential (primary) hypertension: Secondary | ICD-10-CM

## 2020-11-18 DIAGNOSIS — L97509 Non-pressure chronic ulcer of other part of unspecified foot with unspecified severity: Secondary | ICD-10-CM | POA: Diagnosis not present

## 2020-11-18 DIAGNOSIS — I7025 Atherosclerosis of native arteries of other extremities with ulceration: Secondary | ICD-10-CM | POA: Diagnosis not present

## 2020-11-18 LAB — CBC WITH DIFFERENTIAL/PLATELET
Abs Immature Granulocytes: 0.04 10*3/uL (ref 0.00–0.07)
Basophils Absolute: 0 10*3/uL (ref 0.0–0.1)
Basophils Relative: 0 %
Eosinophils Absolute: 0.1 10*3/uL (ref 0.0–0.5)
Eosinophils Relative: 1 %
HCT: 33.4 % — ABNORMAL LOW (ref 39.0–52.0)
Hemoglobin: 11 g/dL — ABNORMAL LOW (ref 13.0–17.0)
Immature Granulocytes: 1 %
Lymphocytes Relative: 15 %
Lymphs Abs: 1.1 10*3/uL (ref 0.7–4.0)
MCH: 26.8 pg (ref 26.0–34.0)
MCHC: 32.9 g/dL (ref 30.0–36.0)
MCV: 81.5 fL (ref 80.0–100.0)
Monocytes Absolute: 0.5 10*3/uL (ref 0.1–1.0)
Monocytes Relative: 6 %
Neutro Abs: 5.9 10*3/uL (ref 1.7–7.7)
Neutrophils Relative %: 77 %
Platelets: 661 10*3/uL — ABNORMAL HIGH (ref 150–400)
RBC: 4.1 MIL/uL — ABNORMAL LOW (ref 4.22–5.81)
RDW: 12.8 % (ref 11.5–15.5)
WBC: 7.6 10*3/uL (ref 4.0–10.5)
nRBC: 0 % (ref 0.0–0.2)

## 2020-11-18 LAB — GLUCOSE, CAPILLARY
Glucose-Capillary: 98 mg/dL (ref 70–99)
Glucose-Capillary: 92 mg/dL (ref 70–99)
Glucose-Capillary: 97 mg/dL (ref 70–99)
Glucose-Capillary: 242 mg/dL — ABNORMAL HIGH (ref 70–99)

## 2020-11-18 MED ORDER — HYDRALAZINE HCL 50 MG PO TABS: 50.0000 mg | ORAL_TABLET | Freq: Four times a day (QID) | ORAL | Status: DC | PRN

## 2020-11-18 MED ORDER — CARVEDILOL 12.5 MG PO TABS
12.5000 mg | ORAL_TABLET | Freq: Two times a day (BID) | ORAL | Status: DC
  Administered 2020-11-18 – 2020-11-20 (×4): 12.5 mg via ORAL
  Filled 2020-11-18 (×4): qty 1

## 2020-11-18 MED ORDER — HYDRALAZINE HCL 50 MG PO TABS
50.0000 mg | ORAL_TABLET | Freq: Once | ORAL | Status: AC
  Administered 2020-11-18: 50 mg via ORAL
  Filled 2020-11-18: qty 1

## 2020-11-18 NOTE — Progress Notes (Signed)
TRH night shift.  The nursing staff reported that the patient's blood pressure was 179/92 mmHg despite receiving hydralazine 25 mg p.o. earlier.  I have ordered hydralazine 50 mg p.o. now and have increased the as needed dosage to 50 mg p.o. every 6 hours.  Sanda Klein, MD.

## 2020-11-18 NOTE — Progress Notes (Signed)
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 Followed by vascular surgery. PT OT advised CIR- SW following and based on his insurance plan is for SNF  Subjective: Patient seen and examined.  He has no complaints.  Assessment & Plan: PVD right lower extremity ischemia with nonhealing ulcer Recent great toe amputation 2 weeks ago at [facility] S/p Elective BKA 3/28: Wound VAC removed on 11/17/2020.  Patient cleared from vascular surgery for discharge.  Waiting for SNF placement.   Leukocytosis likely postop reactive.  Resolved  IDDM: Well controlled.Hemoglobin A1c 9.9, 3/19.  On 35 units nightly Lantus and SSI Recent Labs  Lab 11/17/20 1120 11/17/20 1623 11/17/20 2129 11/18/20 0608 11/18/20 1128  GLUCAP 127* 93 113* 98 92   HTN: Borderline controlled home amlodipine increased to 10 mg 3/31, will increase Coreg to 12.5 mg twice daily and continue current dose of lisinopril and continue as needed hydralazine.  Anemia likely from chronic disease.  Overall stable.  CBC in 1 week. HLD: Continue Crestor Hypoalbuminemia/Proteinuria: Protein/creatinine slightly elevated continue his lisinopril. Will need follow-up with PCP or nephrology as outpatient.  OSA-continue nightly CPAP Morbid Obesity BMI 39:Will benefit with weight loss and PCP follow-up Covid vaccinated 5/29 AND 06/06/2020- WITH PFIZER SEEN IN CARE EVERYWHERE HE IS REQUESTING BOOSTER. WE WILL DO ONE  Diet Order            Diet heart healthy/carb modified Room service appropriate? No; Fluid consistency: Thin  Diet effective now                 Patient's Body mass index is 39.47  kg/m.  DVT prophylaxis: enoxaparin (LOVENOX) injection 40 mg Start: 11/14/20 1000 SCD's Start: 11/13/20 1328 Code Status:   Code Status: Full Code  Family Communication: plan of care discussed with patient at bedside.  Status is: Inpatient  Remains inpatient appropriate because:Inpatient level of care appropriate due to severity of illness  Dispo: The patient is from: Home              Anticipated d/c is to:SNF once bed available pending insurance authorization.                Patient currently is medically stable for discharge   Difficult to place patient No   Unresulted Labs (From admission, onward)         None     Medications reviewed:  Scheduled Meds: . amLODipine  10 mg Oral Daily  . aspirin EC  81 mg Oral Daily  . carvedilol  12.5 mg Oral BID WC  . dapagliflozin propanediol  10 mg Oral Daily  . docusate sodium  100 mg Oral Daily  . DULoxetine  30 mg Oral Daily  . enoxaparin (LOVENOX) injection  40 mg Subcutaneous Q24H  . insulin aspart  0-15 Units Subcutaneous TID WC  . insulin aspart  0-5 Units Subcutaneous QHS  . insulin detemir  35 Units Subcutaneous QHS  . lisinopril  40 mg Oral Daily  . metformin  1,000 mg Oral BID WC  . pantoprazole  40 mg Oral Daily  . rosuvastatin  40 mg Oral Daily   Continuous Infusions:  Consultants:see note  Procedures:see note  Antimicrobials: Anti-infectives (From admission, onward)   Start     Dose/Rate Route Frequency Ordered Stop   11/13/20 1200  ceFAZolin (ANCEF) 3 g in dextrose 5 % 50 mL IVPB        3 g 100 mL/hr over 30 Minutes Intravenous 30 min pre-op 11/13/20 0829 11/13/20 1055     Culture/Microbiology    Component Value Date/Time   SDES BLOOD RIGHT HAND 11/04/2020 0821   SPECREQUEST  11/04/2020 7829    BOTTLES DRAWN AEROBIC AND ANAEROBIC Blood Culture results may not be optimal due to an inadequate volume of blood received in culture bottles   CULT  11/04/2020 0821    NO GROWTH 5 DAYS Performed at [facility], [address], [place], [place] [address]    REPTSTATUS 11/09/2020 FINAL 11/04/2020 0865    Other culture-see note  Objective: Vitals: Today's Vitals   11/18/20 0800 11/18/20 1023 11/18/20 1118 11/18/20 1213  BP: (!) 180/99 (!) 160/90  (!) 151/86  Pulse: 93   89  Resp: 18 18  15   Temp: 98 F (36.7 C)   98.9 F (37.2 C)  TempSrc: Oral   Oral  SpO2: 97%   97%  Weight:      Height:      PainSc: 0-No pain 7  3      Intake/Output Summary (Last 24 hours) at 11/18/2020 1330 Last data filed at 11/18/2020 0804 Gross per 24 hour  Intake --  Output 2380 ml  Net -2380 ml   Filed Weights   11/13/20 0823  Weight: (!) 145.2 kg   Weight change:   Intake/Output from previous day: 04/01 0701 - 04/02 0700 In: -  Out: 2300 [Urine:2300] Intake/Output this shift: Total I/O In: -  Out: 480 [Urine:480] Filed Weights   11/13/20 0823  Weight: (!) 145.2 kg    Examination: General exam: Appears calm and comfortable  Respiratory system: Clear to auscultation. Respiratory effort normal. Cardiovascular system: S1 & S2 heard, RRR. No JVD, murmurs, rubs, gallops or clicks. No pedal edema. Gastrointestinal system: Abdomen is nondistended, soft and nontender. No organomegaly or masses felt. Normal bowel sounds heard. Central nervous system: Alert and oriented. No focal neurological deficits. Extremities: Right BKA Skin: No rashes, lesions or ulcers.  Psychiatry: Judgement and insight appear normal. Mood & affect appropriate.    Data Reviewed: I have personally reviewed following labs and imaging studies CBC: Recent Labs  Lab 11/13/20 1441 11/13/20 2137 11/14/20 0126 11/15/20 0257  WBC 13.9*  --  15.7* 9.8  HGB 9.2* 9.6* 9.3* 9.1*  HCT 28.9* 29.6* 29.0* 27.7*  MCV 82.8  --  83.6 82.4  PLT 660*  --  653* 641*   Basic Metabolic Panel: Recent Labs  Lab 11/13/20 1441 11/14/20 0126 11/15/20 0257  NA  --  135 135  K  --  4.3 3.8  CL  --  104 101  CO2  --  25 25  GLUCOSE   --  157* 105*  BUN  --  10 13  CREATININE 0.88 0.91 0.97  CALCIUM  --  8.3* 8.3*   GFR: Estimated Creatinine Clearance: 134.8 mL/min (by C-G formula based on SCr of 0.97 mg/dL). Liver Function Tests: No results for input(s): AST, ALT, ALKPHOS, BILITOT, PROT, ALBUMIN in the last 168 hours. No results for input(s): LIPASE, AMYLASE in the last 168 hours. No results for input(s): AMMONIA in the last 168 hours. Coagulation Profile: Recent Labs  Lab 11/13/20 0920  INR 1.2  Cardiac Enzymes: No results for input(s): CKTOTAL, CKMB, CKMBINDEX, TROPONINI in the last 168 hours. BNP (last 3 results) No results for input(s): PROBNP in the last 8760 hours. HbA1C: No results for input(s): HGBA1C in the last 72 hours. CBG: Recent Labs  Lab 11/17/20 1120 11/17/20 1623 11/17/20 2129 11/18/20 0608 11/18/20 1128  GLUCAP 127* 93 113* 98 92   Lipid Profile: Recent Labs    11/17/20 0211  CHOL 142  HDL 27*  LDLCALC 95  TRIG 99  CHOLHDL 5.3   Thyroid Function Tests: No results for input(s): TSH, T4TOTAL, FREET4, T3FREE, THYROIDAB in the last 72 hours. Anemia Panel: No results for input(s): VITAMINB12, FOLATE, FERRITIN, TIBC, IRON, RETICCTPCT in the last 72 hours. Sepsis Labs: No results for input(s): PROCALCITON, LATICACIDVEN in the last 168 hours.  Recent Results (from the past 240 hour(s))  SARS CORONAVIRUS 2 (TAT 6-24 HRS) Nasopharyngeal Nasopharyngeal Swab     Status: None   Collection Time: 11/09/20 12:18 PM   Specimen: Nasopharyngeal Swab  Result Value Ref Range Status   SARS Coronavirus 2 NEGATIVE NEGATIVE Final    Comment: (NOTE) SARS-CoV-2 target nucleic acids are NOT DETECTED.  The SARS-CoV-2 RNA is generally detectable in upper and lower respiratory specimens during the acute phase of infection. Negative results do not preclude SARS-CoV-2 infection, do not rule out co-infections with other pathogens, and should not be used as the sole basis for treatment or other  patient management decisions. Negative results must be combined with clinical observations, patient history, and epidemiological information. The expected result is Negative.  Fact Sheet for Patients: HairSlick.no  Fact Sheet for Healthcare Providers: quierodirigir.com  This test is not yet approved or cleared by the [place] FDA and  has been authorized for detection and/or diagnosis of SARS-CoV-2 by FDA under an Emergency Use Authorization (EUA). This EUA will remain  in effect (meaning this test can be used) for the duration of the COVID-19 declaration under Se ction 564(b)(1) of the Act, 21 U.S.C. section 360bbb-3(b)(1), unless the authorization is terminated or revoked sooner.  Performed at [facility], [address], [place], [place] [address]      Radiology Studies: No results found.   LOS: 5 days   Hughie Closs, MD [facility]  11/18/2020, 1:30 PM

## 2020-11-18 NOTE — Progress Notes (Signed)
7a MD paged to inform pt BP still elevated

## 2020-11-18 NOTE — Progress Notes (Signed)
MD notified pt is hypertensive and has already received PRN hydralazine approx 2 hours ago.

## 2020-11-18 NOTE — Progress Notes (Signed)
Mobility Specialist - Progress Note   11/18/20 1142  Mobility  Activity Refused mobility   Pt refused stating "they just gave me a bunch of medicine, and I am too weak right now." Pt refused sitting on edge of bed, standing, or pivot to chair despite importance of mobility being explained. Will f/u as able.   Roy Taylor Mobility Specialist Mobility Specialist Phone: [phone]

## 2020-11-19 DIAGNOSIS — I7025 Atherosclerosis of native arteries of other extremities with ulceration: Secondary | ICD-10-CM | POA: Diagnosis not present

## 2020-11-19 DIAGNOSIS — L97509 Non-pressure chronic ulcer of other part of unspecified foot with unspecified severity: Secondary | ICD-10-CM | POA: Diagnosis not present

## 2020-11-19 LAB — GLUCOSE, CAPILLARY
Glucose-Capillary: 137 mg/dL — ABNORMAL HIGH (ref 70–99)
Glucose-Capillary: 144 mg/dL — ABNORMAL HIGH (ref 70–99)
Glucose-Capillary: 112 mg/dL — ABNORMAL HIGH (ref 70–99)
Glucose-Capillary: 152 mg/dL — ABNORMAL HIGH (ref 70–99)

## 2020-11-19 LAB — BASIC METABOLIC PANEL
Anion gap: 7 (ref 5–15)
BUN: 12 mg/dL (ref 6–20)
CO2: 26 mmol/L (ref 22–32)
Calcium: 8.5 mg/dL — ABNORMAL LOW (ref 8.9–10.3)
Chloride: 99 mmol/L (ref 98–111)
Creatinine, Ser: 1 mg/dL (ref 0.61–1.24)
GFR, Estimated: >60 mL/min (ref >60)
Glucose, Bld: 165 mg/dL — ABNORMAL HIGH (ref 70–99)
Potassium: 3.6 mmol/L (ref 3.5–5.1)
Sodium: 132 mmol/L — ABNORMAL LOW (ref 135–145)

## 2020-11-19 MED ORDER — ENOXAPARIN SODIUM 60 MG/0.6ML ~~LOC~~ SOLN
60.0000 mg | SUBCUTANEOUS | Status: DC
  Administered 2020-11-20: 60 mg via SUBCUTANEOUS
  Filled 2020-11-19: qty 0.6

## 2020-11-19 NOTE — TOC Progression Note (Signed)
Transition of Care [facility]) - Progression Note    Patient Details  Name: Roy Taylor MRN: [id number] Date of Birth: [birth year]  Transition of Care [facility]) CM/SW Contact  Patrice [place], [place] Phone Number: [phone] 11/19/2020, 3:59 PM  Clinical Narrative:     CSW called [facility] to check on authorization and was informed that the admission staff is not there on the weekends.  TOC team will continue to assist with discharge planning needs.  Expected Discharge Plan: Skilled Nursing Facility Barriers to Discharge: Insurance Authorization,Continued Medical Work up,SNF Pending bed offer  Expected Discharge Plan and Services Expected Discharge Plan: Skilled Nursing Facility In-house Referral: Clinical Social Work                                             Social Determinants of Health (SDOH) Interventions    Readmission Risk Interventions No flowsheet data found.

## 2020-11-19 NOTE — Progress Notes (Signed)
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 Followed by vascular surgery. PT OT advised CIR- SW following and based on his insurance plan is for SNF.  He has been cleared by vascular surgery for discharge.  Wound VAC removed on 11/17/2020.  Subjective: Seen and examined.  No complaints.  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  Patient cleared from vascular surgery for discharge.  Waiting for SNF placement.   Leukocytosis likely postop reactive.  Resolved  IDDM: Well controlled.Hemoglobin A1c 9.9, 3/19.  On 35 units nightly Lantus and SSI Recent Labs  Lab 11/18/20 0608 11/18/20 1128 11/18/20 1633 11/18/20 2120 11/19/20 0628  GLUCAP 98 92 97 242* 137*   HTN: Now much better controlled.  Continue current regimen with increased dose of Coreg.  Anemia likely from chronic disease.  Overall stable.  CBC in 1 week. HLD: Continue Crestor Hypoalbuminemia/Proteinuria: Protein/creatinine slightly elevated continue his lisinopril. Will need follow-up with PCP or nephrology as outpatient.  OSA-continue nightly CPAP Morbid Obesity BMI 39:Will benefit with weight loss and PCP follow-up Covid vaccinated 5/29 AND 06/06/2020- WITH PFIZER SEEN IN CARE EVERYWHERE HE IS REQUESTING BOOSTER. WE WILL DO ONE  Diet Order            Diet heart healthy/carb modified Room service appropriate? No; Fluid consistency: Thin  Diet effective now                 Patient's Body mass index is 39.47 kg/m.  DVT prophylaxis:  enoxaparin (LOVENOX) injection 40 mg Start: 11/14/20 1000 SCD's Start: 11/13/20 1328 Code Status:   Code Status: Full Code  Family Communication: plan of care discussed with patient at bedside.  Status is: Inpatient  Remains inpatient appropriate because:Inpatient level of care appropriate due to severity of illness  Dispo: The patient is from: Home              Anticipated d/c is to:SNF once bed available pending insurance authorization.                Patient currently is medically stable for discharge   Difficult to place patient No   Unresulted Labs (From admission, onward)         None     Medications reviewed:  Scheduled Meds: . amLODipine  10 mg Oral Daily  . aspirin EC  81 mg Oral Daily  . carvedilol  12.5 mg Oral BID WC  . dapagliflozin propanediol  10 mg Oral Daily  . docusate sodium  100 mg Oral Daily  . DULoxetine  30 mg Oral Daily  . enoxaparin (LOVENOX) injection  40 mg Subcutaneous Q24H  . insulin aspart  0-15 Units Subcutaneous TID WC  . insulin aspart  0-5 Units Subcutaneous QHS  . insulin detemir  35 Units Subcutaneous QHS  . lisinopril  40 mg Oral Daily  . metformin  1,000 mg Oral BID WC  . pantoprazole  40 mg Oral Daily  . rosuvastatin  40 mg Oral Daily   Continuous Infusions:  Consultants:see note  Procedures:see note  Antimicrobials:  Anti-infectives (From admission, onward)   Start     Dose/Rate Route Frequency Ordered Stop   11/13/20 1200  ceFAZolin (ANCEF) 3 g in dextrose 5 % 50 mL IVPB        3 g 100 mL/hr over 30 Minutes Intravenous 30 min pre-op 11/13/20 0829 11/13/20 1055     Culture/Microbiology    Component Value Date/Time   SDES BLOOD RIGHT HAND 11/04/2020 0821   SPECREQUEST  11/04/2020 4097    BOTTLES DRAWN AEROBIC AND ANAEROBIC Blood Culture results may not be optimal due to an inadequate volume of blood received in culture bottles   CULT  11/04/2020 0821    NO GROWTH 5 DAYS Performed at [facility], [address], [place], [place] [address]    REPTSTATUS 11/09/2020 FINAL 11/04/2020 9242    Other culture-see note  Objective: Vitals: Today's Vitals   11/18/20 2137 11/19/20 0000 11/19/20 0114 11/19/20 0814  BP:    (!) 144/87  Pulse:  94  78  Resp:  18  17  Temp:  98.4 F (36.9 C)  98 F (36.7 C)  TempSrc:  Oral  Oral  SpO2:  95%  96%  Weight:      Height:      PainSc: 2  2  2       Intake/Output Summary (Last 24 hours) at 11/19/2020 1101 Last data filed at 11/19/2020 0900 Gross per 24 hour  Intake 600 ml  Output 850 ml  Net -250 ml   Filed Weights   11/13/20 0823  Weight: (!) 145.2 kg   Weight change:   Intake/Output from previous day: 04/02 0701 - 04/03 0700 In: 360 [P.O.:360] Out: 1330 [Urine:1330] Intake/Output this shift: Total I/O In: 240 [P.O.:240] Out: -  Filed Weights   11/13/20 0823  Weight: (!) 145.2 kg    Examination: General exam: Appears calm and comfortable  Respiratory system: Clear to auscultation. Respiratory effort normal. Cardiovascular system: S1 & S2 heard, RRR. No JVD, murmurs, rubs, gallops or clicks. No pedal edema. Gastrointestinal system: Abdomen is nondistended, soft and nontender. No organomegaly or masses felt. Normal bowel sounds heard. Central nervous system: Alert and oriented. No focal neurological deficits. Extremities: Right BKA Skin: No rashes, lesions or ulcers.  Psychiatry: Judgement and insight appear normal. Mood & affect appropriate.    Data Reviewed: I have personally reviewed following labs and imaging studies CBC: Recent Labs  Lab 11/13/20 1441 11/13/20 2137 11/14/20 0126 11/15/20 0257 11/18/20 1341  WBC 13.9*  --  15.7* 9.8 7.6  NEUTROABS  --   --   --   --  5.9  HGB 9.2* 9.6* 9.3* 9.1* 11.0*  HCT 28.9* 29.6* 29.0* 27.7* 33.4*  MCV 82.8  --  83.6 82.4 81.5  PLT 660*  --  653* 641* 661*   Basic Metabolic Panel: Recent Labs  Lab 11/13/20 1441 11/14/20 0126 11/15/20 0257 11/19/20 0203  NA  --  135 135 132*  K   --  4.3 3.8 3.6  CL  --  104 101 99  CO2  --  25 25 26   GLUCOSE  --  157* 105* 165*  BUN  --  10 13 12   CREATININE 0.88 0.91 0.97 1.00  CALCIUM  --  8.3* 8.3* 8.5*   GFR: Estimated Creatinine Clearance: 130.8 mL/min (by C-G formula based on SCr of 1 mg/dL). Liver Function Tests: No results for input(s): AST, ALT, ALKPHOS, BILITOT, PROT, ALBUMIN in the last 168 hours. No results for  input(s): LIPASE, AMYLASE in the last 168 hours. No results for input(s): AMMONIA in the last 168 hours. Coagulation Profile: Recent Labs  Lab 11/13/20 0920  INR 1.2   Cardiac Enzymes: No results for input(s): CKTOTAL, CKMB, CKMBINDEX, TROPONINI in the last 168 hours. BNP (last 3 results) No results for input(s): PROBNP in the last 8760 hours. HbA1C: No results for input(s): HGBA1C in the last 72 hours. CBG: Recent Labs  Lab 11/18/20 0608 11/18/20 1128 11/18/20 1633 11/18/20 2120 11/19/20 0628  GLUCAP 98 92 97 242* 137*   Lipid Profile: Recent Labs    11/17/20 0211  CHOL 142  HDL 27*  LDLCALC 95  TRIG 99  CHOLHDL 5.3   Thyroid Function Tests: No results for input(s): TSH, T4TOTAL, FREET4, T3FREE, THYROIDAB in the last 72 hours. Anemia Panel: No results for input(s): VITAMINB12, FOLATE, FERRITIN, TIBC, IRON, RETICCTPCT in the last 72 hours. Sepsis Labs: No results for input(s): PROCALCITON, LATICACIDVEN in the last 168 hours.  Recent Results (from the past 240 hour(s))  SARS CORONAVIRUS 2 (TAT 6-24 HRS) Nasopharyngeal Nasopharyngeal Swab     Status: None   Collection Time: 11/09/20 12:18 PM   Specimen: Nasopharyngeal Swab  Result Value Ref Range Status   SARS Coronavirus 2 NEGATIVE NEGATIVE Final    Comment: (NOTE) SARS-CoV-2 target nucleic acids are NOT DETECTED.  The SARS-CoV-2 RNA is generally detectable in upper and lower respiratory specimens during the acute phase of infection. Negative results do not preclude SARS-CoV-2 infection, do not rule out co-infections with  other pathogens, and should not be used as the sole basis for treatment or other patient management decisions. Negative results must be combined with clinical observations, patient history, and epidemiological information. The expected result is Negative.  Fact Sheet for Patients: HairSlick.no  Fact Sheet for Healthcare Providers: quierodirigir.com  This test is not yet approved or cleared by the [place] FDA and  has been authorized for detection and/or diagnosis of SARS-CoV-2 by FDA under an Emergency Use Authorization (EUA). This EUA will remain  in effect (meaning this test can be used) for the duration of the COVID-19 declaration under Se ction 564(b)(1) of the Act, 21 U.S.C. section 360bbb-3(b)(1), unless the authorization is terminated or revoked sooner.  Performed at [facility], [address], [place], [place] [address]      Radiology Studies: No results found.   LOS: 6 days   Hughie Closs, MD [facility]  11/19/2020, 11:01 AM

## 2020-11-19 NOTE — Progress Notes (Signed)
Mobility Specialist - Progress Note   11/19/20 1046  Mobility  Activity Stood at bedside  Level of Assistance Standby assist, set-up cues, supervision of patient - no hands on  Assistive Device Front wheel walker  Mobility Response Tolerated well  Mobility performed by Mobility specialist  $Mobility charge 1 Mobility   Pt c/o throbbing pain at amputation site when standing, otherwise asx. He was standby assist to stand w/ bed height elevated. HR remained ~85 throughout. Pt declined any attempt at ambulating or pivoting to chair.   Mamie Levers Mobility Specialist Mobility Specialist Phone: [phone]

## 2020-11-20 DIAGNOSIS — L97509 Non-pressure chronic ulcer of other part of unspecified foot with unspecified severity: Secondary | ICD-10-CM | POA: Diagnosis not present

## 2020-11-20 DIAGNOSIS — I7025 Atherosclerosis of native arteries of other extremities with ulceration: Secondary | ICD-10-CM | POA: Diagnosis not present

## 2020-11-20 LAB — RESP PANEL BY RT-PCR (FLU A&B, COVID) ARPGX2
Influenza A by PCR: NEGATIVE
Influenza B by PCR: NEGATIVE
SARS Coronavirus 2 by RT PCR: NEGATIVE

## 2020-11-20 LAB — GLUCOSE, CAPILLARY
Glucose-Capillary: 225 mg/dL — ABNORMAL HIGH (ref 70–99)
Glucose-Capillary: 124 mg/dL — ABNORMAL HIGH (ref 70–99)

## 2020-11-20 MED ORDER — AMLODIPINE BESYLATE 10 MG PO TABS: 10.0000 mg | ORAL_TABLET | Freq: Every day | ORAL | 0 refills | Status: DC

## 2020-11-20 MED ORDER — CARVEDILOL 12.5 MG PO TABS: 12.5000 mg | ORAL_TABLET | Freq: Two times a day (BID) | ORAL | 0 refills | Status: DC

## 2020-11-20 MED ORDER — OXYCODONE-ACETAMINOPHEN 5-325 MG PO TABS: 1.0000 | ORAL_TABLET | ORAL | 0 refills | Status: DC | PRN

## 2020-11-20 MED ORDER — PANTOPRAZOLE SODIUM 40 MG PO TBEC: 40.0000 mg | DELAYED_RELEASE_TABLET | Freq: Every day | ORAL | 0 refills | Status: DC

## 2020-11-20 NOTE — Progress Notes (Addendum)
Physical Therapy Treatment Patient Details Name: Roy Taylor MRN: [id number] DOB: 1966-01-24 Today's Date: 11/20/2020    History of Present Illness Patient is a 55 y/o male who presents on 11/13/20 for elective right BKA. PMH includes HTN and DM.    PT Comments    Pt received in supine, drowsy and minimally verbal but agreeable to therapy session with encouragement. Pt performed supine BLE A/AAROM therapeutic exercises with good tolerance and able to perform single leg bridge with L foot support but deferring EOB/transfer training due to pain and pt very lethargic throughout. HEP handout given: [email or web address] Access Code: [email or web address]. Continued instruction on phantom pain relief strategies, importance of positioning (no pillows under knee), safety with mobility and importance of positional changes t/o day. Pt continues to benefit from PT services to progress toward functional mobility goals. Continue to recommend SNF.   Follow Up Recommendations  SNF     Equipment Recommendations  Wheelchair (measurements PT);Wheelchair cushion (measurements PT)    Recommendations for Other Services       Precautions / Restrictions Precautions Precautions: Fall Precaution Comments: right BKA Restrictions Weight Bearing Restrictions: Yes RLE Weight Bearing: Non weight bearing Other Position/Activity Restrictions: s/p R BKA    Mobility  Bed Mobility Overal bed mobility: Needs Assistance             General bed mobility comments: posterior supine scoot toward HOB, minA needs cues for sequencing/self/assist with flat HOB; pt defers EOB/OOB due to pain    Transfers                 General transfer comment: pt refusing to attempt  Ambulation/Gait                 Stairs             Wheelchair Mobility    Modified Rankin (Stroke Patients Only)       Balance                                            Cognition Arousal/Alertness:  Lethargic;Suspect due to medications Behavior During Therapy: Flat affect Overall Cognitive Status: No family/caregiver present to determine baseline cognitive functioning Area of Impairment: Attention;Awareness                   Current Attention Level: Sustained       Awareness: Emergent Problem Solving: Slow processing;Decreased initiation;Requires verbal cues General Comments: disengaged, minimal eye contact, minimal interaction with therapist; drowsy but participatory with encouragement; notably, when MD entered room to speak with him at end of session, pt more alert/responsive      Exercises General Exercises - Lower Extremity Quad Sets: AROM;Both;10 reps;Supine Heel Slides: AROM;Left;10 reps;Supine Hip ABduction/ADduction: AROM;AAROM;Both;10 reps;Supine (at times AA to encourage increased ROM) Straight Leg Raises: AROM;Right;10 reps;Supine Amputee Exercises Knee Flexion: AROM;Right;10 reps;Supine Other Exercises Other Exercises: single leg bridges with L foot blocked/supported and RLE in extension/elevated x5 reps supine    General Comments General comments (skin integrity, edema, etc.): HR 80's-90's bpm and SpO2 WNL on RA; deferred BP assessment as pt refusing EOB/OOB mobility      Pertinent Vitals/Pain Pain Assessment: 0-10 Pain Score: 5  Pain Location: RLE at rest, increased to 7/10 with supine exercises Pain Descriptors / Indicators: Throbbing;Operative site guarding Pain Intervention(s): Limited activity within patient's tolerance;Monitored during session;Premedicated before session;Repositioned (very drowsy, likely 2/2 pain meds)  Home Living                      Prior Function            PT Goals (current goals can now be found in the care plan section) Acute Rehab PT Goals Patient Stated Goal: not stated PT Goal Formulation: With patient Time For Goal Achievement: 11/28/20 Potential to Achieve Goals: Fair Progress towards PT goals:  Progressing toward goals (slow progress, pain/lethargy limiting)    Frequency    Min 3X/week      PT Plan Discharge plan needs to be updated    Co-evaluation              AM-PAC PT "6 Clicks" Mobility   Outcome Measure  Help needed turning from your back to your side while in a flat bed without using bedrails?: None Help needed moving from lying on your back to sitting on the side of a flat bed without using bedrails?: A Little Help needed moving to and from a bed to a chair (including a wheelchair)?: A Little Help needed standing up from a chair using your arms (e.g., wheelchair or bedside chair)?: A Lot Help needed to walk in hospital room?: Total Help needed climbing 3-5 steps with a railing? : Total 6 Click Score: 14    End of Session   Activity Tolerance: Patient limited by lethargy Patient left: in bed;with call bell/phone within reach;with bed alarm set Nurse Communication: Mobility status PT Visit Diagnosis: Unsteadiness on feet (R26.81);Muscle weakness (generalized) (M62.81);Other abnormalities of gait and mobility (R26.89)     Time: 4132-4401 PT Time Calculation (min) (ACUTE ONLY): 11 min  Charges:  $Therapeutic Exercise: 8-22 mins                     Duffy Dantonio P., PTA Acute Rehabilitation Services Pager: [phone] Office: [phone]   [place] 11/20/2020, 12:40 PM

## 2020-11-20 NOTE — TOC Transition Note (Addendum)
Transition of Care [facility]) - CM/SW Discharge Note   Patient Details  Name: Roy Taylor MRN: [id number] Date of Birth: 17-May-1966  [facility] CM/SW Contact:  Eduard Roux, LCSW Phone Number: 11/20/2020, 12:48 PM   Clinical Narrative:     Patient will Discharge to: [facility] Discharge Date: 11/20/2020 Family Notified: Stacey,spouse  Transport [id number]  Per MD patient is ready for discharge. RN, patient, and facility notified of discharge. Discharge Summary sent to facility. RN given number for [phone]. Ambulance transport requested for patient.   Clinical Social Worker signing off.  Antony Blackbird, MSW, LCSW Clinical Social Worker    Final next level of care: Skilled Nursing Facility Barriers to Discharge: Barriers Resolved   Patient Goals and CMS Choice        Discharge Placement              Patient chooses bed at:  [facility] Patient to be transferred to facility by: [facility] Name of family member notified: Stacey,unable to leave message Patient and family notified of of transfer: 11/20/20  Discharge Plan and Services In-house Referral: Clinical Social Work                                   Social Determinants of Health (SDOH) Interventions     Readmission Risk Interventions No flowsheet data found.

## 2020-11-20 NOTE — Progress Notes (Signed)
Patient discharged to [facility]. Report called to RN at facility. VS remain stable. Patient is alert and oriented and in no acute distress. All personal items sent with patient who was transferred by [facility]. Brynda Rim, RN

## 2020-11-20 NOTE — Discharge Instructions (Signed)
Leg Amputation, Care After °This sheet gives you information about how to care for yourself after your procedure. Your health care provider may also give you more specific instructions. If you have problems or questions, contact your health care provider. °What can I expect after the procedure? °After the procedure, it is common to have: °· A little blood or fluid coming from your incision. °· Pain from your incision. °· Pain that feels like it is coming from the leg that has been removed (phantom pain). This can last for a year or longer. °· Skin breakdown on your stump (residual limb). °· Feelings of depression, anxiety, and fear. °Follow these instructions at home: °Medicines °· Take over-the-counter and prescription medicines only as told by your health care provider. °· If you were prescribed an antibiotic medicine, take it as told by your health care provider. Do not stop taking the antibiotic even if you start to feel better. °Bathing °· Do not take baths, swim, use a hot tub, or get your residual limb wet until your health care provider approves. You may only be allowed to take sponge baths. °· Ask your health care provider when you may start taking showers. After taking a shower, make sure to rinse and dry your residual limb carefully. °Incision care °· Check your residual limb, especially your incision area, every day. Check for: °? More redness, swelling, or pain. °? More fluid or blood. °? Warmth. °? Pus or a bad smell. °? Blisters. °? Scrapes. °· Follow instructions from your health care provider about how to take care of your incision. Make sure you: °? Wash your hands with soap and water before you change your bandage (dressing). If soap and water are not available, use hand sanitizer. °? Change your dressing as told by your health care provider. °? Leave stitches (sutures), skin glue, or adhesive strips in place. These skin closures may need to stay in place for 2 weeks or longer. If adhesive strip  edges start to loosen and curl up, you may trim the loose edges. Do not remove adhesive strips completely unless your health care provider tells you to do that.   °Activity °· Return to your normal activities as told by your health care provider. Ask your health care provider what activities are safe for you. °· Do physical therapy exercises as told by your health care provider. °· If you have been fitted with an artificial leg (prosthesis) or have been given crutches, use them as told by your health care provider. °Eating and drinking °· Eat a healthy diet that includes whole grains, fruits and vegetables, low-fat dairy products, and lean proteins. °· Drink enough fluid to keep your urine pale yellow. °Driving °· Work with an occupational therapist to learn new strategies for safe driving with an amputation. °· Do not drive or use heavy equipment while taking prescription pain medicine. °General instructions °· To prevent or treat constipation while you are taking prescription pain medicine, your health care provider may recommend that you: °? Drink enough fluid to keep your urine pale yellow. °? Take over-the-counter or prescription medicines. °? Eat foods that are high in fiber, such as fresh fruits and vegetables, whole grains, and beans. °? Limit foods that are high in fat and processed sugars, such as fried and sweet foods. °· Do not use oils, lotion, cream, or rubbing alcohol on the remaining part of your leg. °· Wear compression stockings as told by your health care provider. °· If you have trouble   coping with your amputation, contact your health care provider. Some feelings of depression, anxiety, or fear are normal after an amputation, but if you struggle with these feelings or if they get overwhelming, your provider may be able to recommend a therapist or support group to help you. °· Do not use any products that contain nicotine or tobacco, such as cigarettes and e-cigarettes. These can delay bone  healing. If you need help quitting, ask your health care provider. °· Keep all follow-up visits as told by your health care provider. This is important. °Contact a health care provider if: °· You have a fever. °· You have more tenderness in your residual limb. °· You have a rash or itchy skin. °· You have a cough or chills and you feel achy and weak. °· You have trouble coping with your amputation. °· You have blisters or scrapes on your residual limb. °Get help right away if: °· You have severe pain in your residual limb. °· You have more redness, swelling, or pain around your incision. °· You have more fluid or blood coming from your incision. °· Your incision feels warm to the touch, tender, and painful. °· You have pus or a bad smell coming from your incision. °· You feel light-headed and have shortness of breath. °· You have blood-soaked bandages. °· You cough up blood. °· You have chest pain or pain when taking a deep breath or coughing. If you have these symptoms, do not drive yourself to the hospital. Call emergency services right away. °If you ever feel like you may hurt yourself or others, or have thoughts about taking your own life, get help right away. You can go to your nearest emergency department or call: °· Your local emergency services (911 in the [place] °· A suicide crisis helpline, such as the National Suicide Prevention Lifeline at 1-800-273-8255. This is open 24 hours a day. °Summary °· After a leg amputation, you may have pain that feels like it is coming from the leg that was removed (phantom pain). This can last for a year or longer. °· Follow instructions from your health care provider about how to take care of your incision. °· Check your residual limb, especially your incision area, every day. More redness, swelling, or pain may be a sign of infection. °· Contact your health care provider if you have trouble coping with your amputation. °This information is not intended to replace advice  given to you by your health care provider. Make sure you discuss any questions you have with your health care provider. °Document Revised: 11/18/2019 Document Reviewed: 11/18/2019 °Elsevier Patient Education © 2021 [employer] ° °

## 2020-11-20 NOTE — Care Management Important Message (Signed)
Important Message  Patient Details  Name: Roy Taylor MRN: [id number] Date of Birth: 1966/03/23   Medicare Important Message Given:  Yes     [name] 11/20/2020, 10:33 AM

## 2020-11-20 NOTE — Progress Notes (Signed)
Patient refused CPAP.

## 2020-11-20 NOTE — Discharge Summary (Signed)
Physician Discharge Summary  Roy Taylor BBC:[id number] DOB: [birth year] DOA: 11/13/2020  PCP: Renaldo Harrison, DO  Admit date: 11/13/2020 Discharge date: 11/20/2020 30 Day Unplanned Readmission Risk Score   Flowsheet Row Admission (Current) from 11/13/2020 in [name]E CV SURGICAL PROGRESSIVE CARE  30 Day Unplanned Readmission Risk Score (%) 8.98 Filed at 11/20/2020 0801     This score is the patient's risk of an unplanned readmission within 30 days of being discharged (0 -100%). The score is based on dignosis, age, lab data, medications, orders, and past utilization.   Low:  0-14.9   Medium: 15-21.9   High: 22-29.9   Extreme: 30 and above         Admitted From: Home Disposition: SNF  Recommendations for Outpatient Follow-up:  1. Follow up with PCP in 1-2 weeks 2. Follow-up with vascular surgery in 1 to 2 weeks 3. Please obtain BMP/CBC in one week 4. Please follow up with your PCP on the following pending results: Unresulted Labs (From admission, onward)          Start     Ordered   11/20/20 0959  Resp Panel by RT-PCR (Flu A&B, Covid) Nasopharyngeal Swab  (Tier 2 - Symptomatic/asymptomatic with Precautions )  Once,   R       Question Answer Comment  Is this test for diagnosis or screening Screening   Symptomatic for COVID-19 as defined by CDC No   Hospitalized for COVID-19 No   Admitted to ICU for COVID-19 No   Previously tested for COVID-19 Yes   Resident in a congregate (group) care setting No   Employed in healthcare setting No   Has patient completed COVID vaccination(s) (2 doses of Pfizer/Moderna 1 dose of [employer]) Unknown      11/20/20 0958            Home Health: None Equipment/Devices: None  Discharge Condition: Stable CODE STATUS: Full code Diet recommendation: Cardiac  Subjective: Seen and examined.  Feels well.  No complaint, pain controlled.  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 Followed by vascular surgery. PT OT advised CIR- SW following and based on his insurance, they recommended SNF. He has been cleared by vascular surgery for discharge.  Wound VAC removed on 11/17/2020.  Diabetes remained under control.  Blood pressure was uncontrolled for which medication adjustments were made.  Currently blood pressure very well controlled.  SNF has been arranged for him and insurance has been authorized.  Covid test pending.  Once resulted, he will be discharged today.  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 Start taking on: November 21, 2020   aspirin 81 MG EC tablet Take 1 tablet (81 mg total) by mouth daily.  Swallow whole.   carvedilol 12.5 MG tablet Commonly known as: COREG Take 1 tablet (12.5 mg total) by mouth 2 (two) times daily with a meal. What changed:   medication strength  how much to take   dapagliflozin propanediol 10 MG Tabs tablet Commonly known as: FARXIGA Take 10 mg by mouth daily.   DULoxetine 30 MG capsule Commonly known as: CYMBALTA Take 30 mg by mouth daily.   ibuprofen 600 MG tablet Commonly known as: ADVIL Take 1 tablet (600 mg total) by mouth every 6 (six) hours as needed for up to 30 doses for moderate pain.   insulin detemir 100  UNIT/ML injection Commonly known as: LEVEMIR Inject 35 Units into the skin at bedtime.   lisinopril 40 MG tablet Commonly known as: ZESTRIL Take 40 mg by mouth daily.   metformin 1000 MG (OSM) 24 hr tablet Commonly known as: FORTAMET Take 1,000 mg by mouth 2 (two) times daily with a meal.   oxyCODONE-acetaminophen 5-325 MG tablet Commonly known as: PERCOCET/ROXICET Take 1-2 tablets by mouth every 4 (four) hours as needed for moderate pain.   pantoprazole 40 MG tablet Commonly known as: PROTONIX Take 1 tablet (40 mg total) by mouth daily. Start taking on: November 21, 2020   rosuvastatin 5 MG tablet Commonly known as: CRESTOR Take 1 tablet (5 mg total) by mouth daily.       Follow-up Information    Addis, [name], DO Follow up in 1 week(s).   Specialty: Family Medicine Contact information: [address] [place] [id number] [phone]        Sherren KernsFields, Charles E, MD Follow up in 4 week(s).   Specialties: Vascular Surgery, Cardiology Why: The office will contact the patient with appointment Contact information: [address] [place] [id number] [phone]              No Known Allergies  Consultations: Vascular surgery   Procedures/Studies: PERIPHERAL VASCULAR CATHETERIZATION  Result Date: 11/07/2020 Patient name: Roy CrapeGary Taylor MRN: [id number] DOB: 04/16/1966 Sex: male 11/07/2020 Pre-operative Diagnosis: Nonhealing right toe amputation Post-operative diagnosis:  Same Surgeon:  Durene CalWells Brabham Procedure Performed:  1.  Ultrasound-guided access, left femoral artery  2.  Antegrade ultrasound-guided access, right femoral artery  3.  Abdominal aortogram  4.  Right lower extremity runoff  5.  Angioplasty, right peroneal artery  6.  Conscious sedation, 120 minutes  7.  Mynx closure device to left groin Indications: This is a 55 year old gentleman with diabetes who has undergone a partial toe amputation which is not healing.  He comes in today for vascular evaluation and possible  intervention Procedure:  The patient was identified in the holding area and taken to room 8.  The patient was then placed supine on the table and prepped and draped in the usual sterile fashion.  A time out was called.  Conscious sedation was administered with the use of IV fentanyl and Versed under continuous physician and nurse monitoring.  Heart rate, blood pressure, and oxygen saturation were continuously monitored.  Total sedation time was 120 minutes.  Ultrasound was used to evaluate the left common femoral artery.  It was patent .  A digital ultrasound image was acquired.  A micropuncture needle was used to access the left common femoral artery under ultrasound guidance.  An 018 wire was advanced without resistance and a micropuncture sheath was placed.  The 018 wire was removed and a benson wire was placed.  The micropuncture sheath was exchanged for a 5 french sheath.  An omniflush catheter was advanced over the wire to the level of L-1.  An abdominal angiogram was obtained.  Next, using the omniflush catheter and a benson wire, the aortic bifurcation was crossed and the catheter was placed into theright external iliac artery and right runoff was obtained.  Findings:  Aortogram: No significant renal artery stenosis.  The infrarenal abdominal aorta is widely patent.  Bilateral common and external iliac arteries are patent without stenosis  Right Lower Extremity: The right common femoral profundofemoral, superficial femoral, and popliteal artery are widely patent without stenosis.  The anterior tibial and posterior tibial arteries are occluded at their origin.  There is reconstitution of diseased anterior tibial and posterior tibial artery below the ankle.  The peroneal artery is proximally patent with a high-grade stenosis.  There is a 10 cm occlusion of the peroneal artery  Left Lower Extremity: Not evaluated Intervention: After the above images were acquired the decision was made to proceed with  intervention.  I tried multiple catheters and wires to try to cross the aortic bifurcation however because of the stiffness of the bifurcation this was not possible.  I therefore elected to get antegrade access in the right common femoral artery.  This was done under ultrasound guidance with a micropuncture needle.  A 5 French 45 cm sheath was then inserted.  I used a V-18 wire and a 3 x 100 Sterling balloon as a support catheter.  I was able to navigate the wire into the peroneal artery.  I performed balloon angioplasty of the high-grade stenosis at the origin of the peroneal artery.  I tried to cross the occlusion with multiple different wires and catheters however ultimately this was unsuccessful.  The left groin was closed with a minx.  Manual pressure will be used for closure of the antegrade access Impression:  #1  No significant inflow or outflow disease on the right leg.  #2  Angioplasty of the proximal peroneal artery was performed with a 3 mm balloon however I was unable to cross the occlusion within the peroneal artery.  #3  Consideration for pedal access for revascularization however the patient is at high risk for proximal amputation V. Durene Cal, M.D., FACS [facility] [place] Office: [phone] Pager:  [phone]     Discharge Exam: Vitals:   11/20/20 0344 11/20/20 0802  BP: (!) 144/79 (!) 147/94  Pulse: 96 92  Resp: 19 18  Temp: 97.7 F (36.5 C) 97.8 F (36.6 C)  SpO2: 96% 95%   Vitals:   11/19/20 2058 11/19/20 2356 11/20/20 0344 11/20/20 0802  BP: 110/74 122/75 (!) 144/79 (!) 147/94  Pulse: 82 89 96 92  Resp: 19 19 19 18   Temp: 97.7 F (36.5 C) 97.7 F (36.5 C) 97.7 F (36.5 C) 97.8 F (36.6 C)  TempSrc: Oral Oral Oral Oral  SpO2: 98% 97% 96% 95%  Weight:      Height:        General: Pt is alert, awake, not in acute distress Cardiovascular: RRR, S1/S2 +, no rubs, no gallops Respiratory: CTA bilaterally, no wheezing, no  rhonchi Abdominal: Soft, NT, ND, bowel sounds + Extremities: Right BKA.    The results of significant diagnostics from this hospitalization (including imaging, microbiology, ancillary and laboratory) are listed below for reference.     Microbiology: No results found for this or any previous visit (from the past 240 hour(s)).   Labs: BNP (last 3 results) No results for input(s): BNP in the last  8760 hours. Basic Metabolic Panel: Recent Labs  Lab 11/13/20 1441 11/14/20 0126 11/15/20 0257 11/19/20 0203  NA  --  135 135 132*  K  --  4.3 3.8 3.6  CL  --  104 101 99  CO2  --  25 25 26   GLUCOSE  --  157* 105* 165*  BUN  --  10 13 12   CREATININE 0.88 0.91 0.97 1.00  CALCIUM  --  8.3* 8.3* 8.5*   Liver Function Tests: No results for input(s): AST, ALT, ALKPHOS, BILITOT, PROT, ALBUMIN in the last 168 hours. No results for input(s): LIPASE, AMYLASE in the last 168 hours. No results for input(s): AMMONIA in the last 168 hours. CBC: Recent Labs  Lab 11/13/20 1441 11/13/20 2137 11/14/20 0126 11/15/20 0257 11/18/20 1341  WBC 13.9*  --  15.7* 9.8 7.6  NEUTROABS  --   --   --   --  5.9  HGB 9.2* 9.6* 9.3* 9.1* 11.0*  HCT 28.9* 29.6* 29.0* 27.7* 33.4*  MCV 82.8  --  83.6 82.4 81.5  PLT 660*  --  653* 641* 661*   Cardiac Enzymes: No results for input(s): CKTOTAL, CKMB, CKMBINDEX, TROPONINI in the last 168 hours. BNP: Invalid input(s): POCBNP CBG: Recent Labs  Lab 11/19/20 0628 11/19/20 1154 11/19/20 1702 11/19/20 2100 11/20/20 0621  GLUCAP 137* 144* 112* 152* 225*   D-Dimer No results for input(s): DDIMER in the last 72 hours. Hgb A1c No results for input(s): HGBA1C in the last 72 hours. Lipid Profile No results for input(s): CHOL, HDL, LDLCALC, TRIG, CHOLHDL, LDLDIRECT in the last 72 hours. Thyroid function studies No results for input(s): TSH, T4TOTAL, T3FREE, THYROIDAB in the last 72 hours.  Invalid input(s): FREET3 Anemia work up No results for input(s):  VITAMINB12, FOLATE, FERRITIN, TIBC, IRON, RETICCTPCT in the last 72 hours. Urinalysis    Component Value Date/Time   COLORURINE YELLOW 11/13/2020 0830   APPEARANCEUR HAZY (A) 11/13/2020 0830   LABSPEC 1.017 11/13/2020 0830   PHURINE 5.0 11/13/2020 0830   GLUCOSEU 50 (A) 11/13/2020 0830   HGBUR MODERATE (A) 11/13/2020 0830   BILIRUBINUR NEGATIVE 11/13/2020 0830   KETONESUR NEGATIVE 11/13/2020 0830   PROTEINUR 100 (A) 11/13/2020 0830   NITRITE NEGATIVE 11/13/2020 0830   LEUKOCYTESUR NEGATIVE 11/13/2020 0830   Sepsis Labs Invalid input(s): PROCALCITONIN,  WBC,  LACTICIDVEN Microbiology No results found for this or any previous visit (from the past 240 hour(s)).   Time coordinating discharge: Over 30 minutes  SIGNED:   11/15/2020, MD  [facility] 11/20/2020, 10:02 AM  If 7PM-7AM, please contact night-coverage [email or web address]

## 2020-11-20 NOTE — Progress Notes (Signed)
Right BKA continues to heal No drainage Will see pt back in office in a few weeks  Fabienne Bruns, MD [facility] [place] Office: [phone]

## 2020-12-14 ENCOUNTER — Ambulatory Visit (HOSPITAL_COMMUNITY)
Admission: EM | Admit: 2020-12-14 | Discharge: 2020-12-14 | Disposition: A | Discharge status: Home / Self Care | Payer: Medicare HMO | Attending: Family Medicine | Admitting: Family Medicine

## 2020-12-14 ENCOUNTER — Encounter (HOSPITAL_COMMUNITY): Payer: Self-pay | Admitting: Emergency Medicine

## 2020-12-14 ENCOUNTER — Ambulatory Visit: Payer: Medicare HMO | Admitting: Physician Assistant

## 2020-12-14 ENCOUNTER — Other Ambulatory Visit: Payer: Self-pay

## 2020-12-14 ENCOUNTER — Encounter: Payer: Self-pay | Admitting: Physician Assistant

## 2020-12-14 VITALS — BP 78/61 | HR 89 | Resp 20

## 2020-12-14 DIAGNOSIS — Z89511 Acquired absence of right leg below knee: Secondary | ICD-10-CM

## 2020-12-14 DIAGNOSIS — I959 Hypotension, unspecified: Secondary | ICD-10-CM | POA: Diagnosis not present

## 2020-12-14 LAB — CBG MONITORING, ED: Glucose-Capillary: 170 mg/dL — ABNORMAL HIGH (ref 70–99)

## 2020-12-14 NOTE — Progress Notes (Signed)
  POST OPERATIVE OFFICE NOTE    CC:  F/u for surgery  HPI:  This is a 55 y.o. male who is s/p right below-knee amputation by Dr. Darrick Penna on November 13, 2020.  This was done secondary to diabetic foot ulcer with nonhealing partial toe amputation site.  His wife accompanies him.  He is complaining of nausea and weakness.  This started on arrival to our clinic.  He is awake and alert.  He denies chest pain or shortness of breath.  He did delay taking his antihypertensive today.  His wife states there were some changes made in his regimen while an inpatient.  He was discharged to skilled nursing facility postoperatively.  Aortogram with right lower extremity runoff was performed on November 07, 2020 with angioplasty of the right peroneal artery.  No Known Allergies  Current Outpatient Medications  Medication Sig Dispense Refill  . amLODipine (NORVASC) 10 MG tablet Take 1 tablet (10 mg total) by mouth daily. 30 tablet 0  . carvedilol (COREG) 12.5 MG tablet Take 1 tablet (12.5 mg total) by mouth 2 (two) times daily with a meal. 60 tablet 0  . dapagliflozin propanediol (FARXIGA) 10 MG TABS tablet Take 10 mg by mouth daily.    . DULoxetine (CYMBALTA) 30 MG capsule Take 30 mg by mouth daily.    Marland Kitchen ibuprofen (ADVIL) 600 MG tablet Take 1 tablet (600 mg total) by mouth every 6 (six) hours as needed for up to 30 doses for moderate pain. 30 tablet 0  . insulin detemir (LEVEMIR) 100 UNIT/ML injection Inject 35 Units into the skin at bedtime.    Marland Kitchen lisinopril (ZESTRIL) 40 MG tablet Take 40 mg by mouth daily.    . metformin (FORTAMET) 1000 MG (OSM) 24 hr tablet Take 1,000 mg by mouth 2 (two) times daily with a meal.    . oxyCODONE-acetaminophen (PERCOCET/ROXICET) 5-325 MG tablet Take 1-2 tablets by mouth every 4 (four) hours as needed for moderate pain. 10 tablet 0  . pantoprazole (PROTONIX) 40 MG tablet Take 1 tablet (40 mg total) by mouth daily. 30 tablet 0  . rosuvastatin (CRESTOR) 5 MG tablet Take 1 tablet (5 mg  total) by mouth daily. 30 tablet 0   No current facility-administered medications for this visit.     ROS:  See HPI  BP (!) 78/61 (BP Location: Left Arm, Patient Position: Sitting, Cuff Size: Large)   Pulse 89   Resp 20   SpO2 100%   Physical Exam:  General appearance: Well-developed, well-nourished male who is diaphoretic Cardiac: Heart rate and rhythm are regular Respiratory: Nonlabored Incision: Right lower extremity: His incision is well approximated.  There is moderate edema.  No signs of infection.  Flaps are warm and well-perfused. Neuro: Alert and oriented x4 His skin is cool to touch and moist.         Assessment/Plan:  This is a 55 y.o. male who is s/p: Right below the knee amputation.    Discussed his hypotension he and his wife and the need to proceed to urgent care or to the emergency department mediately.    Discontinue his staples today. Rx for prosthesis, retention sock, limb protector.  Wendi Maya, PA-C [facility] [phone]  Clinic MD:  Dr. Darrick Penna

## 2020-12-14 NOTE — ED Triage Notes (Signed)
Pt here for hypotension episode while at Dr's appt while getting his staples out... Reports BKA on left side in 10/2020  While in the office, BP = 78/61  Here at the [facility], BP = 108/72  Sx also include diaphoresis.... Glucose at Dr's appt was 224 and this was around 1600  Denies headaches, dizziness, blurred vision or feeling like passing out.   A&O x4... NAD... wheelchair present.

## 2020-12-19 NOTE — ED Provider Notes (Signed)
[facility]   [id number] 12/14/20 Arrival Time: 1619  ASSESSMENT & PLAN:  1. Hypotension, unspecified hypotension type    Normal vitals here. CBG slightly elevated here. Feels fine. No further eval necessary.   Follow-up Information    Schedule an appointment as soon as possible for a visit  with Renaldo Harrison, DO.   Specialty: Family Medicine Contact information: [address] [place] [address] [phone]        [facility] EMERGENCY DEPARTMENT.   Specialty: Emergency Medicine Why: If symptoms worsen in any way. Contact information: [address] [id number] [address] [place] [address] [phone]              Reviewed expectations re: course of current medical issues. Questions answered. Outlined signs and symptoms indicating need for more acute intervention. Patient verbalized understanding. After Visit Summary given.   SUBJECTIVE:  Roy Taylor is a 55 y.o. male who reports a low BP at outpatient f/u s/p BKA. "Feel fine now". Wanted BP rechecked.   He reports taking medications as instructed, no medication side effects noted, no TIA's, no chest pain on exertion, no dyspnea on exertion and no swelling of ankles.  Denies symptoms of chest pain, palpations, orthopnea, nocturnal dyspnea, or LE edema.  Social History   Tobacco Use  Smoking Status Never Smoker  Smokeless Tobacco Never Used     ROS: As per HPI.   OBJECTIVE:  Vitals:   12/14/20 1819  BP: 108/72  Pulse: 93  Resp: 16  Temp: (!) 97.4 F (36.3 C)  TempSrc: Oral  SpO2: 99%    General appearance: alert; no distress Eyes: PERRLA; EOMI HENT: normocephalic; atraumatic Neck: supple Lungs: unlabored CV: ref Abdomen: soft, non-tender; bowel sounds normal Extremities: no edema; symmetrical with no gross deformities Skin: warm and dry Psychological: alert and cooperative; normal mood and affect  ECG: Orders placed or performed during  the hospital encounter of 12/14/20  . ED EKG  . ED EKG    Labs: Results for orders placed or performed during the hospital encounter of 12/14/20  POC CBG monitoring  Result Value Ref Range   Glucose-Capillary 170 (H) 70 - 99 mg/dL   Labs Reviewed  CBG MONITORING, ED - Abnormal; Notable for the following components:      Result Value   Glucose-Capillary 170 (*)    All other components within normal limits     No Known Allergies  Past Medical History:  Diagnosis Date  . Diabetes mellitus without complication (HCC)   . Hypertension    Social History   Socioeconomic History  . Marital status: Married    Spouse name: Not on file  . Number of children: Not on file  . Years of education: Not on file  . Highest education level: Not on file  Occupational History  . Not on file  Tobacco Use  . Smoking status: Never Smoker  . Smokeless tobacco: Never Used  Vaping Use  . Vaping Use: Never used  Substance and Sexual Activity  . Alcohol use: Not Currently  . Drug use: Never  . Sexual activity: Not on file  Other Topics Concern  . Not on file  Social History Narrative  . Not on file   Social Determinants of Health   Financial Resource Strain: Not on file  Food Insecurity: Not on file  Transportation Needs: Not on file  Physical Activity: Not on file  Stress: Not on file  Social Connections: Not on file  Intimate Partner Violence: Not on file   History reviewed. No pertinent family history. Past Surgical History:  Procedure Laterality Date  . ABDOMINAL AORTOGRAM W/LOWER EXTREMITY N/A 11/07/2020   Procedure: ABDOMINAL AORTOGRAM W/LOWER EXTREMITY;  Surgeon: Nada Libman, MD;  Location: [facility] INVASIVE CV LAB;  Service: Cardiovascular;  Laterality: N/A;  . AMPUTATION Right 11/13/2020   Procedure: RIGHT BELOW KNEE AMPUTATION;  Surgeon: Sherren Kerns, MD;  Location[facility] OR;  Service: Vascular;  Laterality: Right;  . PERIPHERAL VASCULAR BALLOON ANGIOPLASTY Right  11/07/2020   Procedure: PERIPHERAL VASCULAR BALLOON ANGIOPLASTY;  Surgeon: Nada Libman, MD;  Location: [facility] INVASIVE CV LAB;  Service: Cardiovascular;  Laterality: Right;  peroneal      Mardella Layman, MD 12/19/20 [phone]

## 2020-12-27 DIAGNOSIS — Z89519 Acquired absence of unspecified leg below knee: Secondary | ICD-10-CM | POA: Insufficient documentation

## 2020-12-27 DIAGNOSIS — G546 Phantom limb syndrome with pain: Secondary | ICD-10-CM | POA: Insufficient documentation

## 2020-12-28 ENCOUNTER — Other Ambulatory Visit: Payer: Self-pay | Admitting: Physician Assistant

## 2020-12-28 ENCOUNTER — Telehealth: Payer: Self-pay

## 2020-12-28 NOTE — Telephone Encounter (Signed)
Patient called to report pain in his stump s/p amputation on 3/28. Not having any drainage, swelling, inflammation at site. Discussed with PA, advised patient to follow up with PCP for further pain management.

## 2021-01-05 ENCOUNTER — Emergency Department (HOSPITAL_COMMUNITY)
Admission: EM | Admit: 2021-01-05 | Discharge: 2021-01-05 | Disposition: A | Discharge status: Home / Self Care | Payer: Medicare HMO | Attending: Emergency Medicine | Admitting: Emergency Medicine

## 2021-01-05 ENCOUNTER — Other Ambulatory Visit: Payer: Self-pay

## 2021-01-05 ENCOUNTER — Emergency Department (HOSPITAL_COMMUNITY): Payer: Medicare HMO

## 2021-01-05 ENCOUNTER — Encounter (HOSPITAL_COMMUNITY): Payer: Self-pay | Admitting: *Deleted

## 2021-01-05 DIAGNOSIS — Z7984 Long term (current) use of oral hypoglycemic drugs: Secondary | ICD-10-CM | POA: Insufficient documentation

## 2021-01-05 DIAGNOSIS — E1149 Type 2 diabetes mellitus with other diabetic neurological complication: Secondary | ICD-10-CM | POA: Insufficient documentation

## 2021-01-05 DIAGNOSIS — Z89511 Acquired absence of right leg below knee: Secondary | ICD-10-CM | POA: Diagnosis not present

## 2021-01-05 DIAGNOSIS — L97519 Non-pressure chronic ulcer of other part of right foot with unspecified severity: Secondary | ICD-10-CM | POA: Insufficient documentation

## 2021-01-05 DIAGNOSIS — Z79899 Other long term (current) drug therapy: Secondary | ICD-10-CM | POA: Diagnosis not present

## 2021-01-05 DIAGNOSIS — Z8616 Personal history of COVID-19: Secondary | ICD-10-CM | POA: Insufficient documentation

## 2021-01-05 DIAGNOSIS — E11621 Type 2 diabetes mellitus with foot ulcer: Secondary | ICD-10-CM | POA: Diagnosis not present

## 2021-01-05 DIAGNOSIS — Z794 Long term (current) use of insulin: Secondary | ICD-10-CM | POA: Diagnosis not present

## 2021-01-05 DIAGNOSIS — I10 Essential (primary) hypertension: Secondary | ICD-10-CM | POA: Diagnosis not present

## 2021-01-05 DIAGNOSIS — T8131XA Disruption of external operation (surgical) wound, not elsewhere classified, initial encounter: Secondary | ICD-10-CM | POA: Insufficient documentation

## 2021-01-05 HISTORY — DX: Acquired absence of right leg below knee: Z89.511

## 2021-01-05 MED ORDER — OXYCODONE-ACETAMINOPHEN 5-325 MG PO TABS
1.0000 | ORAL_TABLET | Freq: Once | ORAL | Status: AC
  Administered 2021-01-05: 1 via ORAL
  Filled 2021-01-05: qty 1

## 2021-01-05 MED ORDER — OXYCODONE-ACETAMINOPHEN 5-325 MG PO TABS: 1.0000 | ORAL_TABLET | ORAL | 0 refills | Status: AC | PRN

## 2021-01-05 NOTE — ED Provider Notes (Signed)
[facility] EMERGENCY DEPARTMENT Provider Note   CSN: [id number] Arrival date & time: 01/05/21  0815     History Chief Complaint  Patient presents with  . Leg Pain     Roy Taylor is a 55 y.o. male.  Past medical history of type 1 diabetes, hypertension, peripheral vascular disease  HPI     Past Medical History:  Diagnosis Date  . Diabetes mellitus without complication (HCC)   . Hx of BKA, right (HCC)   . Hypertension     Patient Active Problem List   Diagnosis Date Noted  . 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 Lower limb ischemia 11/13/2020  . Diabetic foot infection (HCC) 11/04/2020  . PAD (peripheral artery disease) (HCC) 11/04/2020  . Diabetic ulcer of toe of right foot associated with type 2 diabetes mellitus (HCC) 11/01/2020  . 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) 09/25/2020  . Hypertensive heart disease without congestive heart failure 06/02/2020  . COVID-19 10/29/2019  . Morbid obesity with BMI of 40.0-44.9, adult (HCC) 09/21/2018  . Ankle joint deformity 09/21/2018  . Osteoarthritis of ankles, 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  . Diabetes (HCC) 10/15/2010  . MORBID OBESITY 10/15/2010  . OBSTRUCTIVE SLEEP APNEA 10/15/2010  . HTN (hypertension) 10/15/2010  . CHEST PAIN UNSPECIFIED 10/15/2010  . Chronic pain of right ankle 08/30/2008  . Charcot's arthropathy 01/29/2008  . Unspecified hereditary and idiopathic peripheral neuropathy 01/29/2008  . Degeneration of lumbar or lumbosacral intervertebral disc 12/03/2007  . Displacement of lumbar intervertebral disc without myelopathy 12/03/2007  . Primary localized osteoarthrosis, ankle and foot 12/03/2007  . Primary localized osteoarthrosis, pelvic region and thigh 12/03/2007  . Type 2 diabetes mellitus without complications (HCC) 12/03/2007    Past Surgical History:  Procedure Laterality Date  . ABDOMINAL AORTOGRAM W/LOWER  EXTREMITY N/A 11/07/2020   Procedure: ABDOMINAL AORTOGRAM W/LOWER EXTREMITY;  Surgeon: Nada Libman, MD;  Location: [facility] INVASIVE CV LAB;  Service: Cardiovascular;  Laterality: N/A;  . AMPUTATION Right 11/13/2020   Procedure: RIGHT BELOW KNEE AMPUTATION;  Surgeon: Sherren Kerns, MD;  Location: [facility] OR;  Service: Vascular;  Laterality: Right;  . PERIPHERAL VASCULAR BALLOON ANGIOPLASTY Right 11/07/2020   Procedure: PERIPHERAL VASCULAR BALLOON ANGIOPLASTY;  Surgeon: Nada Libman, MD;  Location: [facility] INVASIVE CV LAB;  Service: Cardiovascular;  Laterality: Right;  peroneal       No family history on file.  Social History   Tobacco Use  . Smoking status: Never Smoker  . Smokeless tobacco: Never Used  Vaping Use  . Vaping Use: Never used  Substance Use Topics  . Alcohol use: Not Currently  . Drug use: Never    Home Medications Prior to Admission medications   Medication Sig Start Date End Date Taking? Authorizing Provider  amLODipine (NORVASC) 10 MG tablet Take 1 tablet (10 mg total) by mouth daily. 11/21/20 12/21/20  Hughie Closs, MD  carvedilol (COREG) 12.5 MG tablet Take 1 tablet (12.5 mg total) by mouth 2 (two) times daily with a meal. 11/20/20 12/20/20  Hughie Closs, MD  dapagliflozin propanediol (FARXIGA) 10 MG TABS tablet Take 10 mg by mouth daily.    [provider]  DULoxetine (CYMBALTA) 30 MG capsule Take 30 mg by mouth daily.    [provider]  ibuprofen (ADVIL) 600 MG tablet Take 1 tablet (600 mg total) by mouth every 6 (six) hours as needed for up to 30 doses for moderate pain.  11/09/20   Lanae Boast, MD  insulin detemir (LEVEMIR) 100 UNIT/ML injection Inject 35 Units into the skin at bedtime.    [provider]  lisinopril (ZESTRIL) 40 MG tablet Take 40 mg by mouth daily.    [provider]  metformin (FORTAMET) 1000 MG (OSM) 24 hr tablet Take 1,000 mg by mouth 2 (two) times daily with a meal.    [provider]   oxyCODONE-acetaminophen (PERCOCET/ROXICET) 5-325 MG tablet Take 1-2 tablets by mouth every 4 (four) hours as needed for moderate pain. 11/20/20   Hughie Closs, MD  pantoprazole (PROTONIX) 40 MG tablet Take 1 tablet (40 mg total) by mouth daily. 11/21/20 12/21/20  Hughie Closs, MD  rosuvastatin (CRESTOR) 5 MG tablet Take 1 tablet (5 mg total) by mouth daily. 11/09/20 12/09/20  Lanae Boast, MD    Allergies    Patient has no known allergies.  Review of Systems   Review of Systems  Constitutional: Negative for chills and fever.  HENT: Negative for ear pain and sore throat.   Eyes: Negative for pain and visual disturbance.  Respiratory: Negative for cough and shortness of breath.   Cardiovascular: Negative for chest pain and palpitations.  Gastrointestinal: Negative for abdominal pain and vomiting.  Genitourinary: Negative for dysuria and hematuria.  Musculoskeletal: Positive for arthralgias. Negative for back pain.  Skin: Negative for color change and rash.  Neurological: Negative for seizures and syncope.  All other systems reviewed and are negative.   Physical Exam Updated Vital Signs BP 140/80   Pulse 88   Temp 98.1 F (36.7 C) (Oral)   Resp 18   Ht 6' 3.5" (1.918 m)   Wt 117 kg   SpO2 99%   BMI 31.82 kg/m   Physical Exam Vitals and nursing note reviewed.  Constitutional:      Appearance: He is well-developed.  HENT:     Head: Normocephalic and atraumatic.  Eyes:     Conjunctiva/sclera: Conjunctivae normal.  Cardiovascular:     Rate and Rhythm: Normal rate.     Pulses: Normal pulses.  Pulmonary:     Effort: Pulmonary effort is normal. No respiratory distress.  Musculoskeletal:     Cervical back: Neck supple.     Comments: R BKA stump: along there lateral aspect there is ~3cm area of dehiscence along incision site; along medial aspect ~1cm area of dehiscence  Skin:    General: Skin is warm and dry.     Comments: Wound as above  Neurological:     Mental Status: He is  alert.       Media Information         Document Information  Photos  R leg (lateral)  01/05/2021 09:37  Attached To:  Hospital Encounter on 01/05/21   Source Information  Cyrah Mclamb, Quit[place] Livings, MD  [facility]    Media Information         Document Information  Photos  R Leg   01/05/2021 09:37  Attached To:  Hospital Encounter on 01/05/21   Source Information  Isela Stantz, [place]man Livings, MD  [facility]     ED Results / Procedures / Treatments   Labs (all labs ordered are listed, but only abnormal results are displayed) Labs Reviewed - No data to display  EKG None  Radiology DG Knee 2 Views Right  Result Date: 01/05/2021 CLINICAL DATA:  55 year old male status post below the knee amputation in March. Pain for 1 week. EXAM: RIGHT KNEE - 1-2 VIEW COMPARISON:  None.  FINDINGS: Status post right BKA. Osteotomy sites appear satisfactory. Distal soft tissue swelling and stranding. No definite abnormal soft tissue gas. No joint effusion on the cross-table lateral view. No acute osseous abnormality identified. IMPRESSION: 1. Soft tissue swelling and stranding at the surgical site post BKA. 2. No osseous abnormality identified. Electronically Signed   By: Odessa Fleming M.D.   On: 01/05/2021 09:38    Procedures Procedures   Medications Ordered in ED Medications  oxyCODONE-acetaminophen (PERCOCET/ROXICET) 5-325 MG per tablet 1 tablet (has no administration in time range)    ED Course  I have reviewed the triage vital signs and the nursing notes.  Pertinent labs & imaging results that were available during my care of the patient were reviewed by me and considered in my medical decision making (see chart for details).  Clinical Course as of 01/05/21 0951  Fri Jan 05, 2021  0940 Will discuss with vascular [RD]    Clinical Course User Index [RD] Milagros Loll, MD   MDM Rules/Calculators/A&P                         55 year old male presenting to ER  with concern for wound dehiscence from his right BKA.  Originally completed by Dr. Darrick Penna on 3/28.  Patient reports staples removed around 2 weeks ago.  There are 2 small areas of wound dehiscence, no overlying erythema or induration to suggest infection at present.  Discussed the case and photos of wounds with Dr. Ferne Coe on-call for vascular.  He recommends wet-to-dry dressing for now and close f/u in there clinic. Will dc home.     After the discussed management above, the patient was determined to be safe for discharge.  The patient was in agreement with this plan and all questions regarding their care were answered.  ED return precautions were discussed and the patient will return to the ED with any significant worsening of condition.   Final Clinical Impression(s) / ED Diagnoses Final diagnoses:  Hx of BKA, right (HCC)  Disruption or dehiscence of closure of skin, initial encounter    Rx / DC Orders ED Discharge Orders    None       Milagros Loll, MD 01/05/21 [id number]

## 2021-01-05 NOTE — ED Notes (Signed)
Pt d/c home per MD order. Wet to dry dressing applied per MD request. Pt verbalizes understanding of discharge summary. Off unit via WC. No s/s of acute distress noted at discharge. Reports discharge ride home.

## 2021-01-05 NOTE — ED Triage Notes (Signed)
Pt states RBKA in March and he just returned home from Rehab 3 weeks ago.  He is still waiting for his cuff to come. Wound has not healed completely. Pt sates he is having the same pain since the surgery - no increase.

## 2021-01-05 NOTE — Discharge Instructions (Signed)
Please complete wet-to-dry dressing as discussed.  Please follow-up with the vascular surgery clinic.  If you have increasing pain, fever, swelling, redness, come back to ER for reassessment.  Can take the prescribed Percocet as needed for breakthrough pain.  Note this can make her drowsy should not be Tylenol driving or operating heavy machinery.

## 2021-01-25 ENCOUNTER — Encounter: Payer: Self-pay | Admitting: Physician Assistant

## 2021-01-25 ENCOUNTER — Other Ambulatory Visit: Payer: Self-pay

## 2021-01-25 ENCOUNTER — Ambulatory Visit (INDEPENDENT_AMBULATORY_CARE_PROVIDER_SITE_OTHER): Payer: Medicare HMO | Admitting: Physician Assistant

## 2021-01-25 VITALS — BP 143/93 | HR 107 | Temp 98.6°F | Resp 20 | Ht 75.5 in | Wt 315.0 lb

## 2021-01-25 DIAGNOSIS — Z89511 Acquired absence of right leg below knee: Secondary | ICD-10-CM

## 2021-01-25 NOTE — Progress Notes (Signed)
    Postoperative Visit    History of Present Illness   Roy Taylor is a 55 y.o. male who presents for postoperative follow-up for: right below-the-knee amputation by Dr. Darrick Penna.(Date: 11/13/20).  He has 2 open areas on the medial and lateral aspect of the stump incision.  He was seen in the emergency department at the end of last month and has been doing wet-to-dry's once daily at home.  He believes the wounds are improving.  He denies any fevers, chills, nausea/vomiting  For VQI Use Only   PRE-ADM LIVING: Home  AMB STATUS: Ambulatory with Assistance   Physical Examination   Vitals:   01/25/21 0856  BP: (!) 143/93  Pulse: (!) 107  Resp: 20  Temp: 98.6 F (37 C)  SpO2: 99%    RLE: Open area on lateral side 4 x 3 cm with healthy appearing wound bed and no purulence; open area on the medial side 2 x 2 cm with healthy appearing wound bed and no purulence   Medical Decision Making   AZAVION BOUILLON is a 55 y.o. male who presents s/p right below-the-knee amputation.  Open wounds of right below the knee incision much improved compared to emergency department picture on 01/05/2021. Continue wet-to-dry dressing changes at least daily Okay to proceed with fitting for limb shrinker Follow up in 2-3 weeks for wound check  Emilie Rutter PA-C [facility] Office: [phone]  Clinic MD: Darrick Penna

## 2021-01-31 DIAGNOSIS — E785 Hyperlipidemia, unspecified: Secondary | ICD-10-CM | POA: Insufficient documentation

## 2021-01-31 DIAGNOSIS — E1169 Type 2 diabetes mellitus with other specified complication: Secondary | ICD-10-CM

## 2021-01-31 HISTORY — DX: Type 2 diabetes mellitus with other specified complication: E11.69

## 2021-02-28 NOTE — Progress Notes (Addendum)
HISTORY AND PHYSICAL     CC:  follow up Requesting Provider:  Renaldo Harrison, DO  HPI: Roy Taylor is a 55 y.o. (1965-12-11) male who presents for follow up for right BKA by Dr. Darrick Penna on 11/13/2020.  At last visit on 6.9.2022, he had 2 open areas on the medial and lateral aspect of the stump.  He had been doing wet to dry dressing changes.  These wounds were much improved compared to the ER pictures on 01/05/2021.  He was to continue wet to dry dressing changes and ok to proceed with fitting for limb shrinker and f/u in 2-3 weeks.  He is here today for that appt.   He states that his amputation site is doing well and healing.  There is no drainage from the stump.  He does not have any issues with the left leg.    The pt is on a statin for cholesterol management.  The pt is on a daily aspirin.   Other AC:  none The pt is on ACEI for hypertension.   The pt is diabetic.   Tobacco hx:  never    Past Medical History:  Diagnosis Date   Diabetes mellitus without complication (HCC)    Hx of BKA, right (HCC)    Hypertension     Past Surgical History:  Procedure Laterality Date   ABDOMINAL AORTOGRAM W/LOWER EXTREMITY N/A 11/07/2020   Procedure: ABDOMINAL AORTOGRAM W/LOWER EXTREMITY;  Surgeon: Nada Libman, MD;  Location: [facility] INVASIVE CV LAB;  Service: Cardiovascular;  Laterality: N/A;   AMPUTATION Right 11/13/2020   Procedure: RIGHT BELOW KNEE AMPUTATION;  Surgeon: Sherren Kerns, MD;  Location: [facility] OR;  Service: Vascular;  Laterality: Right;   PERIPHERAL VASCULAR BALLOON ANGIOPLASTY Right 11/07/2020   Procedure: PERIPHERAL VASCULAR BALLOON ANGIOPLASTY;  Surgeon: Nada Libman, MD;  Location: [facility] INVASIVE CV LAB;  Service: Cardiovascular;  Laterality: Right;  peroneal    Social History   Socioeconomic History   Marital status: Married    Spouse name: Not on file   Number of children: Not on file   Years of education: Not on file   Highest education level: Not on file   Occupational History   Not on file  Tobacco Use   Smoking status: Never   Smokeless tobacco: Never  Vaping Use   Vaping Use: Never used  Substance and Sexual Activity   Alcohol use: Not Currently   Drug use: Never   Sexual activity: Not on file  Other Topics Concern   Not on file  Social History Narrative   Not on file   Social Determinants of Health   Financial Resource Strain: Not on file  Food Insecurity: Not on file  Transportation Needs: Not on file  Physical Activity: Not on file  Stress: Not on file  Social Connections: Not on file  Intimate Partner Violence: Not on file     Current Outpatient Medications  Medication Sig Dispense Refill   aspirin 325 MG tablet equate aspirin 325 mg tab  TAKE 1 TABLET BY MOUTH ONCE DAILY TO PREVENT BLOOD CLOTS     carvedilol (COREG) 12.5 MG tablet Take 1 tablet (12.5 mg total) by mouth 2 (two) times daily with a meal. 60 tablet 0   ibuprofen (ADVIL) 600 MG tablet Take 1 tablet (600 mg total) by mouth every 6 (six) hours as needed for up to 30 doses for moderate pain. 30 tablet 0   insulin detemir (LEVEMIR) 100 UNIT/ML injection Inject 35  Units into the skin at bedtime.     lisinopril (ZESTRIL) 40 MG tablet Take 40 mg by mouth daily.     metformin (FORTAMET) 1000 MG (OSM) 24 hr tablet Take 1,000 mg by mouth 2 (two) times daily with a meal.     oxyCODONE-Acetaminophen (PERCOCET PO)      rosuvastatin (CRESTOR) 5 MG tablet Take 1 tablet (5 mg total) by mouth daily. 30 tablet 0   No current facility-administered medications for this visit.    No Known Allergies   REVIEW OF SYSTEMS:   [X]  denotes positive finding, [ ]  denotes negative finding Cardiac  Comments:  Chest pain or chest pressure:    Shortness of breath upon exertion:    Short of breath when lying flat:    Irregular heart rhythm:        Vascular    Pain in calf, thigh, or hip brought on by ambulation:    Pain in feet at night that wakes you up from your sleep:      Blood clot in your veins:    Leg swelling:         Pulmonary    Oxygen at home:    Productive cough:     Wheezing:         Neurologic    Sudden weakness in arms or legs:     Sudden numbness in arms or legs:     Sudden onset of difficulty speaking or slurred speech:    Temporary loss of vision in one eye:     Problems with dizziness:         Gastrointestinal    Blood in stool:     Vomited blood:         Genitourinary    Burning when urinating:     Blood in urine:        Psychiatric    Major depression:         Hematologic    Bleeding problems:    Problems with blood clotting too easily:        Skin    Rashes or ulcers:        Constitutional    Fever or chills:      PHYSICAL EXAMINATION:  Today's Vitals   03/01/21 1130  BP: 118/79  Pulse: 90  Resp: 20  Temp: 98 F (36.7 C)  TempSrc: Temporal  SpO2: 98%  Height: 6\' 3"  (1.905 m)   Body mass index is 39.37 kg/m.   General:  WDWN in NAD; vital signs documented above Gait: Not observed-in wheelchair HENT: WNL, normocephalic Pulmonary: normal non-labored breathing Cardiac: regular HR Abdomen: obese Skin: without rashes Vascular Exam/Pulses: Brisk Monophasic doppler signals left DP/PT/peroneal Extremities: left foot without any wounds.  Right stump with healing wound.  No drainage present.  Musculoskeletal: no muscle wasting or atrophy  Neurologic: A&O X 3;  No focal weakness or paresthesias are detected; speech fluent/normal Psychiatric:  The pt has  flat  affect.    ASSESSMENT/PLAN:: 55 y.o. male here for follow up for right BKA March 2022 by Dr. [phone] here for follow up wound check.   -right BKA is healing-there is still wound on medial incision.  There is no drainage and healing.   -he has appt for shrinker later this month -will have him return in ~ 6 weeks to check stump-feel it will be completely healed at that point.  Will also get ABI on the left for baseline.    58,  [facility] [phone]  Clinic MD:   Darrick Penna   The patient has a right Below Knee Amputation. The patient is well motivated to return to their prior functional status by utilizing a prosthesis to perform ADL's and maintain a healthy lifestyle. The patient has the physical and cognitive capacity to function with a prosthesis.   Functional Level: [employer] Community Ambulator: Has the ability or potential for ambulation with variable cadence, to traverse most environmental barriers, and may have vocational, therapeutic, or exercise activity that demands prosthetic utilization beyond simple locomotion. Pt may benefit from Human resources officer.   Residual Limb History: The skin condition of the residual limb is in tact with small area healing laterally. The patient will continue to monitor the skin of the residual limb and follow hygiene instructions.  The patient is experiencing no pain related to amputation  Prosthetic Prescription Plan: Counseling and education regarding prosthetic management will be provided to the patient via a certified prosthetist. A multi-discipline team, including physical therapy, will manage the prosthetic fabrication, fitting and prosthetic gait training.

## 2021-03-01 ENCOUNTER — Ambulatory Visit (INDEPENDENT_AMBULATORY_CARE_PROVIDER_SITE_OTHER): Payer: Medicare HMO | Admitting: Physician Assistant

## 2021-03-01 ENCOUNTER — Other Ambulatory Visit: Payer: Self-pay

## 2021-03-01 VITALS — BP 118/79 | HR 90 | Temp 98.0°F | Resp 20 | Ht 75.0 in

## 2021-03-01 DIAGNOSIS — Z89511 Acquired absence of right leg below knee: Secondary | ICD-10-CM

## 2021-03-05 ENCOUNTER — Other Ambulatory Visit: Payer: Self-pay

## 2021-03-05 DIAGNOSIS — Z89511 Acquired absence of right leg below knee: Secondary | ICD-10-CM

## 2021-04-12 ENCOUNTER — Ambulatory Visit: Payer: Medicare HMO

## 2021-04-12 ENCOUNTER — Encounter (HOSPITAL_COMMUNITY): Payer: Medicare HMO

## 2021-06-26 DIAGNOSIS — F4321 Adjustment disorder with depressed mood: Secondary | ICD-10-CM | POA: Insufficient documentation

## 2021-08-11 ENCOUNTER — Inpatient Hospital Stay (HOSPITAL_COMMUNITY)
Admission: EM | Admit: 2021-08-11 | Discharge: 2021-08-21 | DRG: 853 | Disposition: A | Discharge status: Skilled Nursing Facility | Payer: Medicare HMO | Attending: Family Medicine | Admitting: Family Medicine

## 2021-08-11 ENCOUNTER — Emergency Department (HOSPITAL_COMMUNITY): Payer: Medicare HMO

## 2021-08-11 ENCOUNTER — Encounter (HOSPITAL_COMMUNITY): Payer: Self-pay | Admitting: Emergency Medicine

## 2021-08-11 ENCOUNTER — Other Ambulatory Visit: Payer: Self-pay

## 2021-08-11 ENCOUNTER — Inpatient Hospital Stay (HOSPITAL_COMMUNITY): Payer: Medicare HMO

## 2021-08-11 DIAGNOSIS — N179 Acute kidney failure, unspecified: Secondary | ICD-10-CM | POA: Diagnosis present

## 2021-08-11 DIAGNOSIS — G546 Phantom limb syndrome with pain: Secondary | ICD-10-CM | POA: Diagnosis not present

## 2021-08-11 DIAGNOSIS — Z89511 Acquired absence of right leg below knee: Secondary | ICD-10-CM

## 2021-08-11 DIAGNOSIS — Z794 Long term (current) use of insulin: Secondary | ICD-10-CM

## 2021-08-11 DIAGNOSIS — E1152 Type 2 diabetes mellitus with diabetic peripheral angiopathy with gangrene: Secondary | ICD-10-CM | POA: Diagnosis present

## 2021-08-11 DIAGNOSIS — I70244 Atherosclerosis of native arteries of left leg with ulceration of heel and midfoot: Secondary | ICD-10-CM | POA: Diagnosis not present

## 2021-08-11 DIAGNOSIS — K5900 Constipation, unspecified: Secondary | ICD-10-CM | POA: Diagnosis present

## 2021-08-11 DIAGNOSIS — Z7982 Long term (current) use of aspirin: Secondary | ICD-10-CM

## 2021-08-11 DIAGNOSIS — G8929 Other chronic pain: Secondary | ICD-10-CM | POA: Diagnosis present

## 2021-08-11 DIAGNOSIS — Z8616 Personal history of COVID-19: Secondary | ICD-10-CM

## 2021-08-11 DIAGNOSIS — Z6835 Body mass index (BMI) 35.0-35.9, adult: Secondary | ICD-10-CM

## 2021-08-11 DIAGNOSIS — Z6841 Body Mass Index (BMI) 40.0 and over, adult: Secondary | ICD-10-CM | POA: Diagnosis not present

## 2021-08-11 DIAGNOSIS — I70262 Atherosclerosis of native arteries of extremities with gangrene, left leg: Secondary | ICD-10-CM | POA: Diagnosis present

## 2021-08-11 DIAGNOSIS — A419 Sepsis, unspecified organism: Principal | ICD-10-CM | POA: Diagnosis present

## 2021-08-11 DIAGNOSIS — I739 Peripheral vascular disease, unspecified: Secondary | ICD-10-CM | POA: Diagnosis not present

## 2021-08-11 DIAGNOSIS — L97529 Non-pressure chronic ulcer of other part of left foot with unspecified severity: Secondary | ICD-10-CM | POA: Diagnosis not present

## 2021-08-11 DIAGNOSIS — E11621 Type 2 diabetes mellitus with foot ulcer: Secondary | ICD-10-CM | POA: Diagnosis present

## 2021-08-11 DIAGNOSIS — E1159 Type 2 diabetes mellitus with other circulatory complications: Secondary | ICD-10-CM | POA: Diagnosis present

## 2021-08-11 DIAGNOSIS — E785 Hyperlipidemia, unspecified: Secondary | ICD-10-CM | POA: Diagnosis present

## 2021-08-11 DIAGNOSIS — E11610 Type 2 diabetes mellitus with diabetic neuropathic arthropathy: Secondary | ICD-10-CM | POA: Diagnosis present

## 2021-08-11 DIAGNOSIS — I152 Hypertension secondary to endocrine disorders: Secondary | ICD-10-CM | POA: Diagnosis present

## 2021-08-11 DIAGNOSIS — R6520 Severe sepsis without septic shock: Secondary | ICD-10-CM | POA: Diagnosis present

## 2021-08-11 DIAGNOSIS — M869 Osteomyelitis, unspecified: Secondary | ICD-10-CM | POA: Diagnosis not present

## 2021-08-11 DIAGNOSIS — G609 Hereditary and idiopathic neuropathy, unspecified: Secondary | ICD-10-CM | POA: Diagnosis present

## 2021-08-11 DIAGNOSIS — G4733 Obstructive sleep apnea (adult) (pediatric): Secondary | ICD-10-CM | POA: Diagnosis present

## 2021-08-11 DIAGNOSIS — E1151 Type 2 diabetes mellitus with diabetic peripheral angiopathy without gangrene: Secondary | ICD-10-CM

## 2021-08-11 DIAGNOSIS — E1169 Type 2 diabetes mellitus with other specified complication: Secondary | ICD-10-CM | POA: Diagnosis present

## 2021-08-11 DIAGNOSIS — E11628 Type 2 diabetes mellitus with other skin complications: Secondary | ICD-10-CM | POA: Diagnosis present

## 2021-08-11 DIAGNOSIS — Z89412 Acquired absence of left great toe: Secondary | ICD-10-CM | POA: Diagnosis not present

## 2021-08-11 DIAGNOSIS — Z538 Procedure and treatment not carried out for other reasons: Secondary | ICD-10-CM | POA: Diagnosis not present

## 2021-08-11 DIAGNOSIS — L97513 Non-pressure chronic ulcer of other part of right foot with necrosis of muscle: Secondary | ICD-10-CM | POA: Diagnosis not present

## 2021-08-11 DIAGNOSIS — I96 Gangrene, not elsewhere classified: Secondary | ICD-10-CM | POA: Diagnosis present

## 2021-08-11 DIAGNOSIS — I70292 Other atherosclerosis of native arteries of extremities, left leg: Secondary | ICD-10-CM | POA: Diagnosis not present

## 2021-08-11 DIAGNOSIS — Z79899 Other long term (current) drug therapy: Secondary | ICD-10-CM | POA: Diagnosis not present

## 2021-08-11 DIAGNOSIS — A480 Gas gangrene: Secondary | ICD-10-CM | POA: Diagnosis present

## 2021-08-11 DIAGNOSIS — Z89512 Acquired absence of left leg below knee: Secondary | ICD-10-CM | POA: Diagnosis not present

## 2021-08-11 DIAGNOSIS — D500 Iron deficiency anemia secondary to blood loss (chronic): Secondary | ICD-10-CM | POA: Diagnosis not present

## 2021-08-11 DIAGNOSIS — Z20822 Contact with and (suspected) exposure to covid-19: Secondary | ICD-10-CM | POA: Diagnosis present

## 2021-08-11 DIAGNOSIS — Z89519 Acquired absence of unspecified leg below knee: Secondary | ICD-10-CM

## 2021-08-11 DIAGNOSIS — L089 Local infection of the skin and subcutaneous tissue, unspecified: Secondary | ICD-10-CM | POA: Diagnosis not present

## 2021-08-11 DIAGNOSIS — L0889 Other specified local infections of the skin and subcutaneous tissue: Secondary | ICD-10-CM | POA: Diagnosis not present

## 2021-08-11 LAB — CBC WITH DIFFERENTIAL/PLATELET
Abs Immature Granulocytes: 0.07 10*3/uL (ref 0.00–0.07)
Basophils Absolute: 0.1 10*3/uL (ref 0.0–0.1)
Basophils Relative: 1 %
Eosinophils Absolute: 0 10*3/uL (ref 0.0–0.5)
Eosinophils Relative: 0 %
HCT: 40.8 % (ref 39.0–52.0)
Hemoglobin: 13 g/dL (ref 13.0–17.0)
Immature Granulocytes: 1 %
Lymphocytes Relative: 15 %
Lymphs Abs: 2 10*3/uL (ref 0.7–4.0)
MCH: 26.5 pg (ref 26.0–34.0)
MCHC: 31.9 g/dL (ref 30.0–36.0)
MCV: 83.3 fL (ref 80.0–100.0)
Monocytes Absolute: 0.7 10*3/uL (ref 0.1–1.0)
Monocytes Relative: 5 %
Neutro Abs: 10.2 10*3/uL — ABNORMAL HIGH (ref 1.7–7.7)
Neutrophils Relative %: 78 %
Platelets: 600 10*3/uL — ABNORMAL HIGH (ref 150–400)
RBC: 4.9 MIL/uL (ref 4.22–5.81)
RDW: 12.3 % (ref 11.5–15.5)
WBC: 13 10*3/uL — ABNORMAL HIGH (ref 4.0–10.5)
nRBC: 0 % (ref 0.0–0.2)

## 2021-08-11 LAB — COMPREHENSIVE METABOLIC PANEL
ALT: 19 U/L (ref 0–44)
AST: 21 U/L (ref 15–41)
Albumin: 2.2 g/dL — ABNORMAL LOW (ref 3.5–5.0)
Alkaline Phosphatase: 89 U/L (ref 38–126)
Anion gap: 10 (ref 5–15)
BUN: 10 mg/dL (ref 6–20)
CO2: 25 mmol/L (ref 22–32)
Calcium: 8.6 mg/dL — ABNORMAL LOW (ref 8.9–10.3)
Chloride: 100 mmol/L (ref 98–111)
Creatinine, Ser: 0.94 mg/dL (ref 0.61–1.24)
GFR, Estimated: >60 mL/min (ref >60)
Glucose, Bld: 150 mg/dL — ABNORMAL HIGH (ref 70–99)
Potassium: 3.4 mmol/L — ABNORMAL LOW (ref 3.5–5.1)
Sodium: 135 mmol/L (ref 135–145)
Total Bilirubin: 0.6 mg/dL (ref 0.3–1.2)
Total Protein: 7 g/dL (ref 6.5–8.1)

## 2021-08-11 LAB — LIPID PANEL
Cholesterol: 188 mg/dL (ref 0–200)
HDL: 33 mg/dL — ABNORMAL LOW (ref >40)
LDL Cholesterol: 141 mg/dL — ABNORMAL HIGH (ref 0–99)
Total CHOL/HDL Ratio: 5.7 RATIO
Triglycerides: 68 mg/dL (ref <150)
VLDL: 14 mg/dL (ref 0–40)

## 2021-08-11 LAB — CBG MONITORING, ED: Glucose-Capillary: 137 mg/dL — ABNORMAL HIGH (ref 70–99)

## 2021-08-11 LAB — HEMOGLOBIN A1C
Hgb A1c MFr Bld: 9.1 % — ABNORMAL HIGH (ref 4.8–5.6)
Mean Plasma Glucose: 214.47 mg/dL

## 2021-08-11 LAB — RESP PANEL BY RT-PCR (FLU A&B, COVID) ARPGX2
Influenza A by PCR: NEGATIVE
Influenza B by PCR: NEGATIVE
SARS Coronavirus 2 by RT PCR: NEGATIVE

## 2021-08-11 LAB — GLUCOSE, CAPILLARY: Glucose-Capillary: 153 mg/dL — ABNORMAL HIGH (ref 70–99)

## 2021-08-11 LAB — C-REACTIVE PROTEIN: CRP: 25 mg/dL — ABNORMAL HIGH (ref <1.0)

## 2021-08-11 LAB — SEDIMENTATION RATE: Sed Rate: 100 mm/hr — ABNORMAL HIGH (ref 0–16)

## 2021-08-11 LAB — LACTIC ACID, PLASMA: Lactic Acid, Venous: 1.2 mmol/L (ref 0.5–1.9)

## 2021-08-11 MED ORDER — MORPHINE SULFATE (PF) 4 MG/ML IV SOLN
4.0000 mg | Freq: Once | INTRAVENOUS | Status: AC
  Administered 2021-08-11: 12:00:00 4 mg via INTRAVENOUS
  Filled 2021-08-11: qty 1

## 2021-08-11 MED ORDER — ACETAMINOPHEN 325 MG PO TABS
650.0000 mg | ORAL_TABLET | Freq: Four times a day (QID) | ORAL | Status: DC | PRN
  Administered 2021-08-11 – 2021-08-12 (×3): 650 mg via ORAL
  Filled 2021-08-11 (×3): qty 2

## 2021-08-11 MED ORDER — MORPHINE SULFATE (PF) 4 MG/ML IV SOLN
4.0000 mg | Freq: Once | INTRAVENOUS | Status: AC
  Administered 2021-08-11: 11:00:00 4 mg via INTRAVENOUS
  Filled 2021-08-11: qty 1

## 2021-08-11 MED ORDER — VANCOMYCIN HCL 10 G IV SOLR
2500.0000 mg | Freq: Once | INTRAVENOUS | Status: AC
  Administered 2021-08-11: 17:00:00 2500 mg via INTRAVENOUS
  Filled 2021-08-11: qty 2500

## 2021-08-11 MED ORDER — ROSUVASTATIN CALCIUM 5 MG PO TABS
10.0000 mg | ORAL_TABLET | Freq: Every day | ORAL | Status: DC
  Administered 2021-08-11 – 2021-08-20 (×10): 10 mg via ORAL
  Filled 2021-08-11 (×10): qty 2

## 2021-08-11 MED ORDER — ROSUVASTATIN CALCIUM 5 MG PO TABS: 10.0000 mg | ORAL_TABLET | Freq: Every day | ORAL | Status: DC

## 2021-08-11 MED ORDER — SODIUM CHLORIDE 0.9 % IV SOLN
2.0000 g | Freq: Three times a day (TID) | INTRAVENOUS | Status: DC
  Administered 2021-08-11 – 2021-08-14 (×7): 2 g via INTRAVENOUS
  Filled 2021-08-11 (×8): qty 2

## 2021-08-11 MED ORDER — LISINOPRIL 20 MG PO TABS
20.0000 mg | ORAL_TABLET | Freq: Every day | ORAL | Status: DC
  Administered 2021-08-11: 21:00:00 20 mg via ORAL
  Filled 2021-08-11: qty 1

## 2021-08-11 MED ORDER — HEPARIN SODIUM (PORCINE) 5000 UNIT/ML IJ SOLN
5000.0000 [IU] | Freq: Three times a day (TID) | INTRAMUSCULAR | Status: DC
  Administered 2021-08-11 – 2021-08-19 (×20): 5000 [IU] via SUBCUTANEOUS
  Filled 2021-08-11 (×20): qty 1

## 2021-08-11 MED ORDER — OXYCODONE HCL 5 MG PO TABS
5.0000 mg | ORAL_TABLET | Freq: Four times a day (QID) | ORAL | Status: DC | PRN
  Administered 2021-08-12 – 2021-08-17 (×9): 5 mg via ORAL
  Filled 2021-08-11 (×10): qty 1

## 2021-08-11 MED ORDER — POTASSIUM CHLORIDE 20 MEQ PO PACK
40.0000 meq | PACK | Freq: Once | ORAL | Status: AC
  Administered 2021-08-11: 21:00:00 40 meq via ORAL
  Filled 2021-08-11: qty 2

## 2021-08-11 MED ORDER — VANCOMYCIN HCL 1500 MG/300ML IV SOLN
1500.0000 mg | Freq: Two times a day (BID) | INTRAVENOUS | Status: DC
  Administered 2021-08-12: 03:00:00 1500 mg via INTRAVENOUS
  Filled 2021-08-11 (×5): qty 300

## 2021-08-11 MED ORDER — PIPERACILLIN-TAZOBACTAM 3.375 G IVPB: 3.3750 g | Freq: Three times a day (TID) | INTRAVENOUS | Status: DC

## 2021-08-11 MED ORDER — ACETAMINOPHEN 650 MG RE SUPP: 650.0000 mg | Freq: Four times a day (QID) | RECTAL | Status: DC | PRN

## 2021-08-11 MED ORDER — ASPIRIN EC 81 MG PO TBEC
81.0000 mg | DELAYED_RELEASE_TABLET | Freq: Every day | ORAL | Status: DC
  Administered 2021-08-11 – 2021-08-21 (×11): 81 mg via ORAL
  Filled 2021-08-11 (×11): qty 1

## 2021-08-11 MED ORDER — SODIUM CHLORIDE 0.9 % IV SOLN: INTRAVENOUS | Status: DC

## 2021-08-11 MED ORDER — INSULIN ASPART 100 UNIT/ML IJ SOLN
0.0000 [IU] | Freq: Three times a day (TID) | INTRAMUSCULAR | Status: DC
  Administered 2021-08-11 – 2021-08-12 (×2): 2 [IU] via SUBCUTANEOUS
  Administered 2021-08-12 – 2021-08-14 (×5): 3 [IU] via SUBCUTANEOUS
  Administered 2021-08-14 – 2021-08-16 (×3): 2 [IU] via SUBCUTANEOUS
  Administered 2021-08-16 – 2021-08-17 (×3): 3 [IU] via SUBCUTANEOUS
  Administered 2021-08-17: 2 [IU] via SUBCUTANEOUS
  Administered 2021-08-18 – 2021-08-19 (×6): 3 [IU] via SUBCUTANEOUS
  Administered 2021-08-20: 5 [IU] via SUBCUTANEOUS
  Administered 2021-08-20 – 2021-08-21 (×4): 3 [IU] via SUBCUTANEOUS

## 2021-08-11 MED ORDER — CLINDAMYCIN PHOSPHATE 600 MG/50ML IV SOLN
600.0000 mg | Freq: Three times a day (TID) | INTRAVENOUS | Status: DC
  Administered 2021-08-11 – 2021-08-14 (×8): 600 mg via INTRAVENOUS
  Filled 2021-08-11 (×8): qty 50

## 2021-08-11 MED ORDER — PIPERACILLIN-TAZOBACTAM 3.375 G IVPB 30 MIN
3.3750 g | Freq: Once | INTRAVENOUS | Status: AC
  Administered 2021-08-11: 11:00:00 3.375 g via INTRAVENOUS
  Filled 2021-08-11: qty 50

## 2021-08-11 NOTE — ED Provider Notes (Signed)
Emergency Medicine Provider Triage Evaluation Note  Roy Taylor , a 55 y.o. male  was evaluated in triage.  Pt complains of left foot wound. He states that he first noticed the wound as an ulcer 2 weeks ago and it has been worsening since. Denies fevers or chills. Denies drainage  Review of Systems  Positive: Left foot wound Negative: Fevers, chills  Physical Exam  BP 139/65 (BP Location: Right Arm)    Pulse (!) 110    Temp 98.1 F (36.7 C) (Oral)    Resp 16    SpO2 99%  Gen:   Awake, no distress   Resp:  Normal effort MSK:   Moves extremities without difficulty  Other:  Gangrenous left great toe  Medical Decision Making  Medically screening exam initiated at 9:06 AM.  Appropriate orders placed.  Roy Taylor was informed that the remainder of the evaluation will be completed by another provider, this initial triage assessment does not replace that evaluation, and the importance of remaining in the ED until their evaluation is complete.  Diabetic foot   Vear Clock 08/11/21 8891    Linwood Dibbles, MD 08/12/21 [phone]

## 2021-08-11 NOTE — ED Notes (Signed)
MD notified of pt's temp of 101.5.

## 2021-08-11 NOTE — Progress Notes (Addendum)
Inpatient Diabetes Program Recommendations  AACE/ADA: New Consensus Statement on Inpatient Glycemic Control (2015)  Target Ranges:  Prepandial:   less than 140 mg/dL      Peak postprandial:   less than 180 mg/dL (1-2 hours)      Critically ill patients:  140 - 180 mg/dL   Lab Results  Component Value Date   GLUCAP 137 (H) 08/11/2021   HGBA1C 9.9 (H) 11/04/2020    Review of Glycemic Control  Diabetes history: DM2 Outpatient Diabetes medications: Levemir 25 units QHS, Farxiga 10 mg QD, Metformin 1000 mg BID Current orders for Inpatient glycemic control: Novolog 0-15 units TID  Received consult for DM diet education.  Patient is currently in ED and will be admitted.  Will order LWWD booklet once transferred floor.  Placed RD consult.  Current A1C pending.  Will see pt. On 10/14/20.    Will continue to follow while inpatient.  Thank you, Dulce Sellar, RN, BSN Diabetes Coordinator Inpatient Diabetes Program [phone] (team pager from 8a-5p)

## 2021-08-11 NOTE — ED Triage Notes (Addendum)
C/o wound to L great toe x 2 weeks.  Pt is diabetic.  Denies fever and chills.

## 2021-08-11 NOTE — Progress Notes (Signed)
Called MRI to check on progress of stat MRI left foot, no answer.  We will try again later.  Fayette Pho, MD PGY-2, [facility] pager [phone]

## 2021-08-11 NOTE — Progress Notes (Signed)
Pharmacy Antibiotic Note  Roy Taylor is a 55 y.o. male admitted on 08/11/2021 with diabetic L foot wound with PMH significant for diabetes and PVD s/p amputation of RLE. Concern for gangrene with gas formation. Pharmacy has been consulted for vancomycin dosing.  12/24: Tm 101.8, WBC 13    Plan: ADD clindamycin 600 mg IV Q8H ADD Cefepime 2g IV Q8H  Vancomycin 1500 IV every 12 hours.  Goal trough 15-20 mcg/mL.    > eAUC 491, SCr used 0.94, Cmax 34.1, Vd 0.5 L/kg Monitor renal function, CBC, cultures/sensitivities, LOT and de-escalate as able Obtain levels as indicated  Temp (24hrs), Avg:100.5 F (38.1 C), Min:98.1 F (36.7 C), Max:101.8 F (38.8 C)  Recent Labs  Lab 08/11/21 0900 08/11/21 0919  WBC 13.0*  --   CREATININE 0.94  --   LATICACIDVEN  --  1.2     Estimated Creatinine Clearance: 131.8 mL/min (by C-G formula based on SCr of 0.94 mg/dL).    No Known Allergies  Antimicrobials this admission: Vancomycin 12/24 >>  Zosyn 12/24 Clindamycin 12/24>> Cefepime 12/24>>   Dose adjustments this admission: none  Microbiology results: 12/24 BCx: sent  Jani Gravel, PharmD PGY-1 Acute Care Resident  08/11/2021 8:22 PM

## 2021-08-11 NOTE — Hospital Course (Addendum)
Roy Taylor is a 55 y.o. 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 Hospital course as outlined below:  L great toe gangrene causing severe sepsis Patient presented with worsening left great toe pain x2 weeks. In the ER, X-ray was concerning for gas gangrene. Patient received Zosyn x1 dose and was continued on Vancomycin, Cefepime, and Clindamycin. MRI of the foot showed signs of early osteomyelitis. VVS was consulted and initially planned for delayed intervention with angiography due to holiday scheduling. Patient ended up meeting severe sepsis criteria overnight and an open amputation of the first toe was completed on 12/25. Culture results from the amputation allowed for antibiotic narrowing to Unasyn. LLE angiogram on 12/27 showed occlusion of all 3 tibial vessels of the LLE. Attempted angioplasty of the left anterior tibial artery was unsuccessful and patient underwent a BKA on 12/28. A wound vac was placed and continued until discharge. Was followed by PT/OT who recommended discharge to SNF.  PAD   HLD  Vascular surgery was consulted during hospitalization and patient underwent a BKA of the LLE. Patient was started on daily ASA and Rosuvastatin was increased to 10mg  QHS. VVS recommended tighter control of blood pressure, CBG, and hyperlipidemia.  T2DM HbA1c 9.1 on admission. Home medications initially held. Patient was started on sliding scale insulin. Glucose significantly improved with amputation. Patient was discharged on home regimen.   HTN BP elevated on admission secondary to severe sepsis infection. Patient was initially continued on home lisinopril until he developed a significant AKI and it was discontinued. Home carvedilol was continued. Started patient on amlodipine 5mg  daily and eventually increased to 10 mg. Lisinopril was  discontinued and patient was discharged on losartan 50 mg.    Other chronic conditions were medically managed with home medications and formulary alternatives as necessary

## 2021-08-11 NOTE — Progress Notes (Addendum)
FPTS Brief Progress Note  S: Patient resting comfortably in bed, left lateral decubitus position with eyes closed.  Appears sleepy but will respond to questions appropriately while keeping eyes closed.  Reports no complaints.  No questions.   O: BP 128/63 (BP Location: Left Arm)    Pulse 98    Temp (!) 100.8 F (38.2 C) (Oral)    Resp 18    Ht 6' 3.5" (1.918 m)    Wt 130.6 kg    SpO2 97%    BMI 35.52 kg/m   GEN: Sleepy but arousable, appropriate responses, oriented, will open eyes on command Cardiac: Regular rate and rhythm, no murmurs appreciated Respiratory: CTA B Extremities: Left great and first toe black eschar tissue with noticeable previous debridement of overlying epithelial layers, photos below        A/P:  Gangrene of left foot (HCC)   PAD (peripheral artery disease) (HCC) Stable from admission.  Pain 5/10.  Fever curve improving, last temp 100.8 F at 2034.  We will follow-up stat MRI left foot and ABIs.  Appreciate vascular surgery recommendations.  Plan to contact [facility] if condition deteriorates overnight, may need emergent surgery at that point. Accordingly, will maintain NPO at this time. If improved overnight, plan to start diet Sunday with NPO @[place]  in anticipation of OR Monday.    Diabetes mellitus type 2 with peripheral artery disease (HCC)  Admission A1c 9.1.  Evening glucose 153.  We will continue moderate sliding scale insulin with 4 times daily CBGs.  Hypertension associated with diabetes (HCC) Has been hypertensive since admission, however last BP normal.  We will continue to monitor.   OSA BiPAP overnight.   - Orders reviewed. Labs for AM ordered, which was adjusted as needed.  - If condition changes, plan includes contacting vascular surgery.   Wednesday, MD 08/11/2021, 9:14 PM PGY-2, [facility] Night Resident  Please page [phone] with questions.

## 2021-08-11 NOTE — ED Notes (Signed)
Vascular at bedside

## 2021-08-11 NOTE — Progress Notes (Signed)
Pharmacy Antibiotic Note  Roy Taylor is a 54 y.o. male admitted on 08/11/2021 with diabetic L foot wound with PMH significant for diabetes and PVD s/p amputation of RLE.  Pharmacy has been consulted for vancomycin dosing.  WBC 13, sed rate 100, lactate 1.2, afebrile SCr 0.94 - at baseline  Plan: Give vancomycin 2500 mg IV x1 Vancomycin 1500 IV every 12 hours.  Goal trough 15-20 mcg/mL.    > eAUC 491, SCr used 0.94, Cmax 34.1, Vd 0.5 L/kg Monitor renal function, CBC, cultures/sensitivities, LOT and de-escalate as able Obtain levels as indicated  Temp (24hrs), Avg:98.1 F (36.7 C), Min:98.1 F (36.7 C), Max:98.1 F (36.7 C)  Recent Labs  Lab 08/11/21 0900 08/11/21 0919  WBC 13.0*  --   CREATININE 0.94  --   LATICACIDVEN  --  1.2    Estimated Creatinine Clearance: 131.8 mL/min (by C-G formula based on SCr of 0.94 mg/dL).    No Known Allergies  Antimicrobials this admission: Vancomycin 12/24 >>  Zosyn x1  Dose adjustments this admission: none  Microbiology results: 12/24 BCx: sent   Filbert Schilder, PharmD PGY1 Pharmacy Resident 08/11/2021  1:09 PM  Please check [email or web address] for unit-specific pharmacy phone numbers.

## 2021-08-11 NOTE — ED Notes (Signed)
Admitting at bedside 

## 2021-08-11 NOTE — ED Notes (Signed)
ED Provider at bedside. 

## 2021-08-11 NOTE — Progress Notes (Signed)
Arrived on unit via stretcher from ER.

## 2021-08-11 NOTE — H&P (Addendum)
Family Medicine Teaching [facility] Admission History and Physical Service Pager: [phone]  Patient name: Roy Taylor Medical record number: [id number] Date of birth: [birth year] Age: 55 y.o. Gender: male  Primary Care Provider: Renaldo Harrison, DO Consultants: [facility] Code Status: Full Code  Preferred Emergency Contact:  Primary Emergency Contact: Streater,Stacey [phone]  Chief Complaint: foot wound  Assessment and Plan: Roy Taylor is a 55 y.o. 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 WBC (!) 13.0, sed rate 100, and soft tissue swelling with gas on XR is concerning for gas gangrene. 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 Still pending CRP. Low suspicion of bacteremia, given he is afebrile, hemodynamically stable, and lactic acid WNL. Blood cultures x2 were drawn, patient was given IV zosyn, and VVS was consulted in the ED. Spoke to VVS, who will see patient today. Patient is followed by [facility] [place] and R BKA was performed by Dr. Darrick Penna.  We will have patient will be NPO and start heparin for VTE in preparation for possible surgery. Suspected 2/2 uncontrolled T2DM and medicine nonadherence that resulted in a worsening diabetic foot ulcer in the setting of severe PAD. Patient stated that he takes metformin, insulin, and blood pressure medicine daily. However when asked about statin therapy, patient  stated that he does not take anything as his cholesterol has not been a problem. Outpatient labs (02/05/2021) showed cholesterol 201, LDL 136, A1c 9.2. Due to the jarring nature of the failed ray amputation which ultimately led to a BKA earlier this year, patient requested that we let the surgeons know that patient is not interested in sparing treatments, and would prefer primary definitive treatment.  Discussed this with on-call vascular provider. Patient admitted to MedSurg with attending McDiarmid, Leighton Roach, MD  VVS following, appreciate and assistance recommendations Vitals per unit routine PT/OT eval and treat  Diabetic coordinator consult for diabetic diet education Diet: NPO effective now, with sips for meds VTE: heparin injection 5,000 Units Start: 08/11/21 1445 IV NS 125 mL/h as patient is NPO CRP pending Follow-up blood cultures x2 Follow-up: CBC, BMP, A1c, lipid panel IV vancomycin per pharmacy Increasing home rosuvastatin 5 mg qHS to 10 mg qHS MRI ordered, follow-up on results  T2DM HbA1c 9.2 (01/2021), last CBG 150 (IP CBG goal <180).  Holding home Farxiga 10 mg daily, metformin 10 mg BID, Levemir 25 units qHS, lisinopril 40 mg daily Moderate SSI No HS, mealtime, basal coverage as he is NPO currently, but will consider after patient is no longer NPO Consulted diabetic coordinator for patient education on diabetic diet Nutrition consulted CGBs q4hr Follow-up A1c  HLD Total cholesterol 201, LDL 136 (01/2021) Rosuvastatin 10 mg qHS Follow-up lipid panel  HTN BP 170s-180s/80s-90s currently.  States that he has not taken his home lisinopril today, and his BP at home runs about 150s SBP.  Holding home lisinopril 10 mg  daily  OSA CPAP qHS  Thrombocytosis PLT 600.  Likely reactive, we will continue to monitor. CBC per above  Hypoalbuminemia   pseudohypocalcemia Albumin 2.2, cCa2+ 10.  We will continue to monitor. BMP per above   FEN/GI: NPO Prophylaxis: heparin  injection 5,000 Units Start: 08/11/21 1445   Disposition:  MedSurg   History of Present Illness:   Roy Taylor is a 55 y.o. male presenting with left-sided great toe pain x2 weeks with sister at bedside.  Stated that it started off with a blister on the bottom of his foot that has continued to progress in size and in pain. Stated that it was not until a shower today where skin sloughed off, that he felt the need to come to the ED. He denied or chills. He endorses med adherence of antidiabetic medications and lisinopril. He reported that his recent A1c is around 7 and his BP at home is SBP 150s. He doesn't take a statin because his "cholesterol has never been a problem." He is able to ambulate with his prosthetic and denies SOB, DOE, and feelings of leg heaviness or pain with ambulation.  When discussing treatment plans, patient requested that he would prefer a more complete amputation rather than attempt a sparing surgery that may result in a larger amputation later on, like the last surgery he had earlier this year. He stated that those events really impacted his mood negatively and would rather avoid it.   Review Of Systems:  Per HPI with the following additions: Review of Systems  Constitutional:  Negative for chills, fatigue, fever and unexpected weight change.  HENT:  Negative for sore throat.   Respiratory:  Negative for cough, shortness of breath and wheezing.   Cardiovascular:  Negative for chest pain and palpitations.  Gastrointestinal:  Negative for abdominal distention, abdominal pain, blood in stool, constipation, diarrhea, nausea and vomiting.  Genitourinary:  Negative for dysuria.  Musculoskeletal:  Negative for arthralgias and myalgias.  Neurological:  Negative for dizziness and headaches.    Patient Active Problem List   Diagnosis Date Noted   Gangrene of left foot ([place]) 08/11/2021   Hyperlipidemia 01/31/2021   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 ([place]) 12/27/2020    Phantom pain ([place]) 12/27/2020   Ischemic foot ulcer due to atherosclerosis of native artery of limb ([place]) 11/13/2020   Lower limb ischemia 11/13/2020   PAD (peripheral artery disease) ([place]) 11/04/2020   Hypertensive heart disease without congestive heart failure 06/02/2020   COVID-19 10/29/2019   Morbid obesity with BMI of 40.0-44.9, adult ([place]) 09/21/2018   Ankle joint deformity 09/21/2018   Osteoarthritis of ankles, 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   Diabetes mellitus type 2 with peripheral artery disease ([place]) 10/15/2010   Morbid obesity ([place]) 10/15/2010   OBSTRUCTIVE SLEEP APNEA 10/15/2010   HTN (hypertension) 10/15/2010   CHEST PAIN UNSPECIFIED 10/15/2010   Chronic pain of right ankle 08/30/2008   Charcot's arthropathy 01/29/2008   Unspecified hereditary and idiopathic peripheral neuropathy 01/29/2008   Degeneration of lumbar or lumbosacral intervertebral disc 12/03/2007   Displacement of lumbar intervertebral disc without myelopathy 12/03/2007   Primary localized osteoarthrosis, ankle and foot 12/03/2007   Primary localized osteoarthrosis, pelvic region and thigh 12/03/2007    Past Medical History: Past Medical History:  Diagnosis Date   Diabetes mellitus without complication ([place])    Diabetic foot infection ([place]) 11/04/2020   Diabetic ulcer of toe of right foot associated with type 2 diabetes mellitus ([place]) 11/01/2020   Disorder of nervous  system due to type 2 diabetes mellitus ([place]) 09/25/2020   Hx of BKA, right ([place])    Hypertension     Past Surgical History: Past Surgical History:  Procedure Laterality Date   ABDOMINAL AORTOGRAM W/LOWER EXTREMITY N/A 11/07/2020   Procedure: ABDOMINAL AORTOGRAM W/LOWER EXTREMITY;  Surgeon: Serafina Mitchell, MD;  Location: [facility];  Service: Cardiovascular;  Laterality: N/A;   AMPUTATION Right 11/13/2020   Procedure: RIGHT BELOW KNEE AMPUTATION;  Surgeon: Elam Dutch, MD;  [facility];  Service: Vascular;  Laterality: Right;    PERIPHERAL VASCULAR BALLOON ANGIOPLASTY Right 11/07/2020   Procedure: PERIPHERAL VASCULAR BALLOON ANGIOPLASTY;  Surgeon: Serafina Mitchell, MD;  Location: [facility];  Service: Cardiovascular;  Laterality: Right;  peroneal    Social History: Social History   Tobacco Use   Smoking status: Never   Smokeless tobacco: Never  Vaping Use   Vaping Use: Never used  Substance Use Topics   Alcohol use: Not Currently   Drug use: Never   Please also refer to relevant sections of EMR.  Family History: No family history on file.  Allergies and Medications: No Known Allergies No current facility-administered medications on file prior to encounter.   Current Outpatient Medications on File Prior to Encounter  Medication Sig Dispense Refill   aspirin 325 MG tablet Take 325 mg by mouth daily.     FARXIGA 10 MG TABS tablet Take 10 mg by mouth daily.     ibuprofen (ADVIL) 200 MG tablet Take 200-600 mg by mouth every 6 (six) hours as needed for headache or mild pain.     insulin detemir (LEVEMIR) 100 UNIT/ML injection Inject 25 Units into the skin at bedtime.     lisinopril (ZESTRIL) 20 MG tablet Take 20 mg by mouth daily.     metFORMIN (GLUCOPHAGE) 1000 MG tablet Take 1,000 mg by mouth 2 (two) times daily with a meal.     rosuvastatin (CRESTOR) 5 MG tablet Take 5 mg by mouth daily.     carvedilol (COREG) 12.5 MG tablet Take 1 tablet (12.5 mg total) by mouth 2 (two) times daily with a meal. (Patient not taking: Reported on 08/11/2021) 60 tablet 0    Objective: BP (!) 176/96    Pulse (!) 101    Temp 98.1 F (36.7 C) (Oral)    Resp 16    Ht 6' 3.5" (1.918 m)    Wt 130.6 kg    SpO2 95%    BMI 35.52 kg/m  Exam: Physical Exam Vitals and nursing note reviewed.  Constitutional:      General: He is awake. He is not in acute distress.    Appearance: He is not ill-appearing, toxic-appearing or diaphoretic.  HENT:     Head: Normocephalic and atraumatic.  Cardiovascular:     Rate and Rhythm:  Regular rhythm. Tachycardia present.     Pulses:          Dorsalis pedis pulses are 0 on the left side.       Posterior tibial pulses are 0 on the left side.     Heart sounds: Normal heart sounds. Heart sounds not distant. No murmur heard.   No friction rub. No gallop.  Pulmonary:     Effort: Pulmonary effort is normal. No respiratory distress.     Breath sounds: Normal breath sounds. No decreased breath sounds, wheezing, rhonchi or rales.  Abdominal:     General: Bowel sounds are normal. There is no distension.  Palpations: Abdomen is soft.     Tenderness: There is no abdominal tenderness. There is no guarding or rebound.  Musculoskeletal:     Right lower leg: No edema.     Left lower leg: 2+ Edema present.     Left foot: Swelling and tenderness present. Abnormal pulse.     Right Lower Extremity: Right leg is amputated below knee.  Feet:     Left foot:     Skin integrity: Skin breakdown, warmth, callus, dry skin and fissure present.     Comments: R BKA skin was intact with no swelling, erythema, nor tenderness to palpation.  Skin:    General: Skin is warm and dry.  Neurological:     Mental Status: He is alert.  Psychiatric:        Behavior: Behavior is cooperative.    Labs and Imaging: CBC BMET  Recent Labs  Lab 08/11/21 0900  WBC 13.0*  HGB 13.0  HCT 40.8  PLT 600*   Recent Labs  Lab 08/11/21 0900  NA 135  K 3.4*  CL 100  CO2 25  BUN 10  CREATININE 0.94  GLUCOSE 150*  CALCIUM 8.6*     EKG: None  DG Foot Complete Left  Result Date: 08/11/2021 CLINICAL DATA:  55 year old diabetic male with left foot wound to the great toe for 2 weeks. EXAM: LEFT FOOT - COMPLETE 3+ VIEW COMPARISON:  None. FINDINGS: Abnormal soft tissue swelling and soft tissue gas about the great toe distal phalanx. But no underlying osteolysis. No fracture or dislocation. Calcified peripheral vascular disease and dystrophic or vascular calcifications also anterior to the tibia. Cuboid  accessory ossicle. No acute osseous abnormality identified. IMPRESSION: Soft tissue swelling and gas about the left great toe distal phalanx. Cannot exclude a gas-forming infection, but no radiographic evidence of osteomyelitis. Electronically Signed   By: Genevie Ann M.D.   On: 08/11/2021 09:52     Merrily Brittle, DO 08/11/2021, 3:51 PM PGY-1, [place] [facility] pager: [phone], text pages welcome   FPTS Upper-Level Resident Addendum   I have independently interviewed and examined the patient. I have discussed the above with the original author and agree with their documentation. Please see also any attending notes.   Gifford Shave, MD PGY-3, [place] [facility] 08/11/2021 5:23 PM  [facility] pager: [phone] (text pages welcome through [facility])

## 2021-08-11 NOTE — ED Provider Notes (Signed)
[facility] EMERGENCY DEPARTMENT Provider Note   CSN: [id number] Arrival date & time: 08/11/21  0900     History Chief Complaint  Patient presents with   foot wound    Roy Taylor is a 55 y.o. male.  HPI  Patient presents to the ED for evaluation of increasing left foot and toe pain.  Patient states he noticed the symptoms a couple weeks ago.  Initially was just a soreness toe.  He was not really having any pain or discomfort.  Patient however does have a history of diabetes and peripheral vascular disease requiring amputation of his right lower extremity.  Patient denies any fevers or chills but started noticing increasing discomfort and pain.  He also started to notice discoloration of the skin of his right big toe.  Past Medical History:  Diagnosis Date   Diabetes mellitus without complication (HCC)    Hx of BKA, right (HCC)    Hypertension     Patient Active Problem List   Diagnosis Date Noted   Hyperlipidemia 01/31/2021   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 (HCC) 12/27/2020   Phantom pain (HCC) 12/27/2020   Ischemic foot ulcer due to atherosclerosis of native artery of limb (HCC) 11/13/2020   Lower limb ischemia 11/13/2020   Diabetic foot infection (HCC) 11/04/2020   PAD (peripheral artery disease) (HCC) 11/04/2020   Diabetic ulcer of toe of right foot associated with type 2 diabetes mellitus (HCC) 11/01/2020   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) 09/25/2020   Hypertensive heart disease without congestive heart failure 06/02/2020   COVID-19 10/29/2019   Morbid obesity with BMI of 40.0-44.9, adult (HCC) 09/21/2018   Ankle joint deformity 09/21/2018   Osteoarthritis of ankles, 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   Diabetes (HCC) 10/15/2010   MORBID OBESITY 10/15/2010   OBSTRUCTIVE SLEEP APNEA 10/15/2010   HTN (hypertension) 10/15/2010   CHEST PAIN UNSPECIFIED 10/15/2010   Chronic pain of right ankle 08/30/2008   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   Unspecified hereditary and idiopathic peripheral neuropathy 01/29/2008   Degeneration of lumbar or lumbosacral intervertebral disc 12/03/2007   Displacement of lumbar intervertebral disc without myelopathy 12/03/2007   Primary localized osteoarthrosis, ankle and foot 12/03/2007   Primary localized osteoarthrosis, pelvic region and thigh 12/03/2007   Type 2 diabetes mellitus without complications (HCC) 12/03/2007    Past Surgical History:  Procedure Laterality Date   ABDOMINAL AORTOGRAM W/LOWER EXTREMITY N/A 11/07/2020   Procedure: ABDOMINAL AORTOGRAM W/LOWER EXTREMITY;  Surgeon: Nada Libman, MD;  Location: [facility];  Service: Cardiovascular;  Laterality: N/A;   AMPUTATION Right 11/13/2020   Procedure: RIGHT BELOW KNEE AMPUTATION;  Surgeon: Sherren Kerns, MD;  Location: [facility] OR;  Service: Vascular;  Laterality: Right;   PERIPHERAL VASCULAR BALLOON ANGIOPLASTY Right 11/07/2020   Procedure: PERIPHERAL VASCULAR BALLOON ANGIOPLASTY;  Surgeon: Nada Libman, MD;  Location: [facility]AS[facility];  Service: Cardiovascular;  Laterality: Right;  peroneal       No family history on file.  Social History   Tobacco Use   Smoking status: Never   Smokeless tobacco: Never  Vaping Use   Vaping Use: Never used  Substance Use Topics   Alcohol use: Not Currently   Drug use: Never    Home Medications Prior to Admission medications   Medication Sig Start Date End Date Taking? Authorizing Provider  aspirin 325 MG tablet equate aspirin 325 mg tab  TAKE 1 TABLET BY MOUTH ONCE DAILY  TO PREVENT BLOOD CLOTS    [provider]  carvedilol (COREG) 12.5 MG tablet Take 1 tablet (12.5 mg total) by mouth 2 (two) times daily with a meal. 11/20/20 12/20/20  Darliss Cheney, MD  ibuprofen (ADVIL) 600 MG tablet Take 1 tablet (600 mg total) by mouth every 6 (six) hours as needed for up to 30 doses for moderate pain. 11/09/20   Antonieta Pert, MD  insulin detemir (LEVEMIR) 100  UNIT/ML injection Inject 35 Units into the skin at bedtime.    [provider]  lisinopril (ZESTRIL) 40 MG tablet Take 40 mg by mouth daily.    [provider]  metformin (FORTAMET) 1000 MG (OSM) 24 hr tablet Take 1,000 mg by mouth 2 (two) times daily with a meal.    [provider]  moxifloxacin (VIGAMOX) 0.5 % ophthalmic solution Apply to eye. 02/12/21   [provider]  prednisoLONE acetate (PRED FORTE) 1 % ophthalmic suspension SMARTSIG:In Eye(s) 02/12/21   [provider]  rosuvastatin (CRESTOR) 10 MG tablet Take 10 mg by mouth at bedtime. 02/01/21   [provider]    Allergies    Patient has no known allergies.  Review of Systems   Review of Systems  All other systems reviewed and are negative.  Physical Exam Updated Vital Signs BP (!) 168/97    Pulse 99    Temp 98.1 F (36.7 C) (Oral)    Resp 16    Ht 1.918 m (6' 3.5")    Wt 130.6 kg    SpO2 96%    BMI 35.52 kg/m   Physical Exam Vitals and nursing note reviewed.  Constitutional:      General: He is not in acute distress.    Appearance: He is well-developed.     Comments: Increased BMI  HENT:     Head: Normocephalic and atraumatic.     Right Ear: External ear normal.     Left Ear: External ear normal.  Eyes:     General: No scleral icterus.       Right eye: No discharge.        Left eye: No discharge.     Conjunctiva/sclera: Conjunctivae normal.  Neck:     Trachea: No tracheal deviation.  Cardiovascular:     Rate and Rhythm: Normal rate and regular rhythm.  Pulmonary:     Effort: Pulmonary effort is normal. No respiratory distress.     Breath sounds: Normal breath sounds. No stridor. No wheezing or rales.  Abdominal:     General: Bowel sounds are normal. There is no distension.     Palpations: Abdomen is soft.     Tenderness: There is no abdominal tenderness. There is no guarding or rebound.  Musculoskeletal:        General: Tenderness present. No deformity.      Cervical back: Neck supple.     Comments: Black gangrenous discoloration of the left big toe, tenderness palpation with some erythema of the entire foot, unable to palpate pulses, posterior tibial or dorsalis pedis  Skin:    General: Skin is warm and dry.     Findings: No rash.  Neurological:     General: No focal deficit present.     Mental Status: He is alert.     Cranial Nerves: No cranial nerve deficit (no facial droop, extraocular movements intact, no slurred speech).     Sensory: No sensory deficit.     Motor: No abnormal muscle tone or seizure activity.  Coordination: Coordination normal.  Psychiatric:        Mood and Affect: Mood normal.    ED Results / Procedures / Treatments   Labs (all labs ordered are listed, but only abnormal results are displayed) Labs Reviewed  COMPREHENSIVE METABOLIC PANEL - Abnormal; Notable for the following components:      Result Value   Potassium 3.4 (*)    Glucose, Bld 150 (*)    Calcium 8.6 (*)    Albumin 2.2 (*)    All other components within normal limits  CBC WITH DIFFERENTIAL/PLATELET - Abnormal; Notable for the following components:   WBC 13.0 (*)    Platelets 600 (*)    Neutro Abs 10.2 (*)    All other components within normal limits  SEDIMENTATION RATE - Abnormal; Notable for the following components:   Sed Rate 100 (*)    All other components within normal limits  CULTURE, BLOOD (ROUTINE X 2)  CULTURE, BLOOD (ROUTINE X 2)  RESP PANEL BY RT-PCR (FLU A&B, COVID) ARPGX2  LACTIC ACID, PLASMA  C-REACTIVE PROTEIN    EKG None  Radiology DG Foot Complete Left  Result Date: 08/11/2021 CLINICAL DATA:  55 year old diabetic male with left foot wound to the great toe for 2 weeks. EXAM: LEFT FOOT - COMPLETE 3+ VIEW COMPARISON:  None. FINDINGS: Abnormal soft tissue swelling and soft tissue gas about the great toe distal phalanx. But no underlying osteolysis. No fracture or dislocation. Calcified peripheral vascular disease and  dystrophic or vascular calcifications also anterior to the tibia. Cuboid accessory ossicle. No acute osseous abnormality identified. IMPRESSION: Soft tissue swelling and gas about the left great toe distal phalanx. Cannot exclude a gas-forming infection, but no radiographic evidence of osteomyelitis. Electronically Signed   By: Genevie Ann M.D.   On: 08/11/2021 09:52    Procedures Procedures   Medications Ordered in ED Medications  0.9 %  sodium chloride infusion ( Intravenous New Bag/Given 08/11/21 1037)  morphine 4 MG/ML injection 4 mg (4 mg Intravenous Given 08/11/21 1037)  piperacillin-tazobactam (ZOSYN) IVPB 3.375 g (0 g Intravenous Stopped 08/11/21 1156)    ED Course  I have reviewed the triage vital signs and the nursing notes.  Pertinent labs & imaging results that were available during my care of the patient were reviewed by me and considered in my medical decision making (see chart for details).  Clinical Course as of 08/11/21 1157  Sat Aug 11, 2021  0949 Presentation concerning for peripheral arterial disease with cyanosis and gangrenous changes of his right toe.  Patient does have history of prior amputation of his right lower extremity [JK]  1040 X-ray shows soft tissue swelling and gas.  We will start the patient on antibiotics.  RN Clarisa Schools was able to doppler a pulse in the foot. [JK]    Clinical Course User Index [JK] Dorie Rank, MD   MDM Rules/Calculators/A&P                         Patient presented to the ED for evaluation of left great toe pain.  Patient does have history of diabetes and peripheral vascular disease.  He has had an amputation of his right lower extremity.  On exam patient has evidence of gangrene of the left big toe and x-rays do suggest soft tissue air.  Patient is hemodynamically stable.  No signs of sepsis or lactic acidosis.  His pulses are weak although we can obtain a Doppler pulse.  I  will consult with vascular surgery in the medical service to  arrange for admission and further treatment.    Final Clinical Impression(s) / ED Diagnoses Final diagnoses:  Gangrene of left foot [facility])       Dorie Rank, MD 08/11/21 1157

## 2021-08-11 NOTE — Consult Note (Signed)
[facility] [place]  ASSESSMENT / PLAN: Roy Taylor is a 55 y.o. male with atherosclerosis of native arteries of left lower extremity and left diabetic foot infection.  Patient counseled patients with chronic limb threatening ischemia have an annual risk of cardiovascular mortality of 25% and a high risk of amputation.   Recommend the following which can slow the progression of atherosclerosis and reduce the risk of major adverse cardiac / limb events:  Complete cessation from all tobacco products. Blood glucose control with goal A1c < 7%. Blood pressure control with goal blood pressure < 140/90 mmHg. Lipid reduction therapy with goal LDL-C <100 mg/dL (<70 if symptomatic from PAD).  Aspirin 81mg  PO QD.  Atorvastatin 40-80mg  PO QD (or other "high intensity" statin therapy).  Will check ABI to help predict benefit from revascularization.  Noted plans for MRI tonight.  The patient is not interested in multiple operations for limb salvage.  I encouraged him to reconsider this position given he is unlikely to ambulate with bilateral major amputations.  I think he is more amenable to limb salvage after our discussion.  Plan left lower extremity angiogram with possible intervention approach in cath lab Tuesday 08/14/21. Unfortunately, this is the earliest availability for angiography with the holiday.  Continue broad-spectrum antibiotics.  If he develops severe sepsis we will need to do a open amputation of the foot urgently.  Will follow along closely.  CHIEF COMPLAINT: gangrene left foot  HISTORY OF PRESENT ILLNESS: Roy Taylor is a 55 y.o. male well-known to our service for critical limb ischemia.  The patient underwent an attempted limb salvage in March 2022.  Unfortunately he had severe tibial disease with severely disadvantaged pedal flow.  He ultimately underwent a below-knee amputation on the right 11/13/20.  He has been fitted for prosthesis and ambulating with it  effectively.  2 weeks ago, he developed a blister on his left great toe.  This is progressively worsened and developed into gangrene.  He has significant pain consistent with ischemic rest pain in the foot.  He does not report a history of claudication prior to this event.  Past Medical History:  Diagnosis Date   Ankle joint deformity 09/21/2018   Charcot's arthropathy 01/29/2008   Formatting of this note might be different from the original.   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 ([place]) 09/25/2020   Displacement of lumbar intervertebral disc without myelopathy 12/03/2007   Hx of BKA, right ([place])    Hyperlipidemia associated with type 2 diabetes mellitus ([place]) 01/31/2021   Hypertension    Ischemic foot ulcer due to atherosclerosis of native artery of limb ([place]) 11/13/2020   Morbid obesity with BMI of 40.0-44.9, adult ([place]) 09/21/2018   Osteoarthritis of ankles, 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 OR;  Service: Vascular;  Laterality: Right;   PERIPHERAL VASCULAR BALLOON ANGIOPLASTY Right 11/07/2020   Procedure: PERIPHERAL VASCULAR BALLOON ANGIOPLASTY;  Surgeon: Serafina Mitchell, MD;  Location: [place] [facility];   Service: Cardiovascular;  Laterality: Right;  peroneal    No family history on file.  Social History   Socioeconomic History   Marital status: Married    Spouse name: Not on file   Number of children: Not on file   Years of education: Not on file   Highest education level: Not on file  Occupational History   Not on file  Tobacco Use   Smoking status: Never   Smokeless tobacco: Never  Vaping Use   Vaping Use: Never used  Substance and Sexual Activity   Alcohol use: Not Currently   Drug use: Never   Sexual activity: Not on file  Other Topics Concern   Not on file  Social History Narrative   Not on file   Social Determinants of Health   Financial Resource Strain: Not on file  Food Insecurity: Not on file  Transportation Needs: Not on file  Physical Activity: Not on file  Stress: Not on file  Social Connections: Not on file  Intimate Partner Violence: Not on file    No Known Allergies  Current Facility-Administered Medications  Medication Dose Route Frequency Provider Last Rate Last Admin   0.9 %  sodium chloride infusion   Intravenous Continuous Merrily Brittle, DO 125 mL/hr at 08/11/21 1037 New Bag at 08/11/21 1037   acetaminophen (TYLENOL) tablet 650 mg  650 mg Oral Q6H PRN Merrily Brittle, DO   650 mg at 08/11/21 1653   Or   acetaminophen (TYLENOL) suppository 650 mg  650 mg Rectal Q6H PRN Merrily Brittle, DO       heparin injection 5,000 Units  5,000 Units Subcutaneous Q8H Merrily Brittle, DO   5,000 Units at 08/11/21 1653   insulin aspart (novoLOG) injection 0-15 Units  0-15 Units Subcutaneous TID WC Merrily Brittle, DO   2 Units at 08/11/21 1652   lisinopril (ZESTRIL) tablet 20 mg  20 mg Oral Daily Gifford Shave, MD       oxyCODONE (Oxy IR/ROXICODONE) immediate release tablet 5 mg  5 mg Oral Q6H PRN Gifford Shave, MD       piperacillin-tazobactam (ZOSYN) IVPB 3.375 g  3.375 g Intravenous Q8H Bertis Ruddy, RPH       rosuvastatin (CRESTOR) tablet 10 mg  10 mg Oral  QHS Merrily Brittle, DO       vancomycin (VANCOCIN) 2,500 mg in sodium chloride 0.9 % 500 mL IVPB  2,500 mg Intravenous Once Merrily Brittle, DO 250 mL/hr at 08/11/21 1659 2,500 mg at 08/11/21 1659   [START ON 08/12/2021] vancomycin (VANCOREADY) IVPB 1500 mg/300 mL  1,500 mg Intravenous Q12H Merrily Brittle, DO       Current Outpatient Medications  Medication Sig Dispense Refill   aspirin 325 MG tablet Take 325 mg by mouth daily.     FARXIGA 10 MG TABS tablet Take 10 mg by mouth daily.     ibuprofen (ADVIL) 200 MG tablet Take 200-600 mg by mouth every 6 (six) hours as needed for headache or mild pain.     insulin detemir (LEVEMIR) 100 UNIT/ML injection Inject 25 Units into the skin at bedtime.     lisinopril (ZESTRIL) 20 MG tablet Take 20 mg by mouth daily.     metFORMIN (GLUCOPHAGE) 1000 MG tablet Take 1,000  mg by mouth 2 (two) times daily with a meal.     rosuvastatin (CRESTOR) 5 MG tablet Take 5 mg by mouth daily.     carvedilol (COREG) 12.5 MG tablet Take 1 tablet (12.5 mg total) by mouth 2 (two) times daily with a meal. (Patient not taking: Reported on 08/11/2021) 60 tablet 0    REVIEW OF SYSTEMS:  [X]  denotes positive finding, [ ]  denotes negative finding Cardiac  Comments:  Chest pain or chest pressure:    Shortness of breath upon exertion:    Short of breath when lying flat:    Irregular heart rhythm:        Vascular    Pain in calf, thigh, or hip brought on by ambulation:    Pain in feet at night that wakes you up from your sleep:  x   Blood clot in your veins:    Leg swelling:         Pulmonary    Oxygen at home:    Productive cough:     Wheezing:         Neurologic    Sudden weakness in arms or legs:     Sudden numbness in arms or legs:     Sudden onset of difficulty speaking or slurred speech:    Temporary loss of vision in one eye:     Problems with dizziness:         Gastrointestinal    Blood in stool:     Vomited blood:         Genitourinary    Burning when  urinating:     Blood in urine:        Psychiatric    Major depression:         Hematologic    Bleeding problems:    Problems with blood clotting too easily:        Skin    Rashes or ulcers:        Constitutional    Fever or chills:      PHYSICAL EXAM Vitals:   08/11/21 1530 08/11/21 1545 08/11/21 1630 08/11/21 1742  BP: (!) 190/101 (!) 186/108 (!) 171/94   Pulse: (!) 109 (!) 107 (!) 112   Resp:      Temp:    (!) 101.5 F (38.6 C)  TempSrc:    Oral  SpO2: 94% 96% 95%   Weight:      Height:        Constitutional: Chronically ill appearing. No distress. Appears well nourished.  Neurologic: CN intact. no focal findings. no sensory loss. Psychiatric:  Mood and affect symmetric and appropriate. Eyes:  No icterus. No conjunctival pallor. Ears, nose, throat:  mucous membranes moist. Midline trachea.  Cardiac: regular rate and rhythm.  Respiratory:  unlabored. Abdominal:  soft, non-tender, non-distended.  Peripheral vascular: R BKA well healed. No L pedal pulses. Extremity: no edema. no cyanosis. no pallor.  Skin: + left first and second toe gangrene with foul odor.   Lymphatic: No Stemmer's sign. No palpable lymphadenopathy.  PERTINENT LABORATORY AND RADIOLOGIC DATA  Most recent CBC CBC Latest Ref Rng & Units 08/11/2021 11/18/2020 11/15/2020  WBC 4.0 - 10.5 K/uL 13.0(H) 7.6 9.8  Hemoglobin 13.0 - 17.0 g/dL 01/18/2021 11.0(L) 9.1(L)  Hematocrit 39.0 - 52.0 % 40.8 33.4(L) 27.7(L)  Platelets 150 - 400 K/uL 600(H) 661(H) 641(H)     Most recent CMP CMP Latest Ref Rng & Units 08/11/2021 11/19/2020 11/15/2020  Glucose 70 - 99 mg/dL 01/19/2021)  165(H) 105(H)  BUN 6 - 20 mg/dL 10 12 13   Creatinine 0.61 - 1.24 mg/dL 0.94 1.00 0.97  Sodium 135 - 145 mmol/L 135 132(L) 135  Potassium 3.5 - 5.1 mmol/L 3.4(L) 3.6 3.8  Chloride 98 - 111 mmol/L 100 99 101  CO2 22 - 32 mmol/L 25 26 25   Calcium 8.9 - 10.3 mg/dL 8.6(L) 8.5(L) 8.3(L)  Total Protein 6.5 - 8.1 g/dL 7.0 - -  Total Bilirubin 0.3 - 1.2  mg/dL 0.6 - -  Alkaline Phos 38 - 126 U/L 89 - -  AST 15 - 41 U/L 21 - -  ALT 0 - 44 U/L 19 - -    Renal function Estimated Creatinine Clearance: 131.8 mL/min (by C-G formula based on SCr of 0.94 mg/dL).  Hgb A1c MFr Bld (%)  Date Value  08/11/2021 9.1 (H)    LDL Cholesterol  Date Value Ref Range Status  11/17/2020 95 0 - 99 mg/dL Final    Comment:           Total Cholesterol/HDL:CHD Risk Coronary Heart Disease Risk Table                     Men   Women  1/2 Average Risk   3.4   3.3  Average Risk       5.0   4.4  2 X Average Risk   9.6   7.1  3 X Average Risk  23.4   11.0        Use the calculated Patient Ratio above and the CHD Risk Table to determine the patient's CHD Risk.        ATP III CLASSIFICATION (LDL):  <100     mg/dL   Optimal  100-129  mg/dL   Near or Above                    Optimal  130-159  mg/dL   Borderline  160-189  mg/dL   High  >190     mg/dL   Very High Performed at [facility] [address], [place], [place] [address]     Yevonne Aline. Stanford Breed, MD [facility] Phone Number: [phone] 08/11/2021 6:00 PM  Total time spent on preparing this encounter including chart review, data review, collecting history, examining the patient, coordinating care for this established patient, 40 minutes.  Portions of this report may have been transcribed using voice recognition software.  Every effort has been made to ensure accuracy; however, inadvertent computerized transcription errors may still be present.

## 2021-08-11 NOTE — Plan of Care (Signed)

## 2021-08-11 NOTE — Progress Notes (Signed)
Pharmacy Antibiotic Note  EHAN FREAS is a 55 y.o. male admitted on 08/11/2021 with diabetic L foot wound with PMH significant for diabetes and PVD s/p amputation of RLE.  Pharmacy has been consulted for vancomycin dosing.  Now consulted to continue zosyn, 1x dose given in ED  Plan: Add zosyn 3.375 g IV q 8h (extended infusion) Vancomycin 1500 IV every 12 hours.  Goal trough 15-20 mcg/mL.    > eAUC 491, SCr used 0.94, Cmax 34.1, Vd 0.5 L/kg Monitor renal function, CBC, cultures/sensitivities, LOT and de-escalate as able Obtain levels as indicated  Temp (24hrs), Avg:100.5 F (38.1 C), Min:98.1 F (36.7 C), Max:101.8 F (38.8 C)  Recent Labs  Lab 08/11/21 0900 08/11/21 0919  WBC 13.0*  --   CREATININE 0.94  --   LATICACIDVEN  --  1.2     Estimated Creatinine Clearance: 131.8 mL/min (by C-G formula based on SCr of 0.94 mg/dL).    No Known Allergies  Antimicrobials this admission: Vancomycin 12/24 >>  Zosyn 12/24>>  Dose adjustments this admission: none  Microbiology results: 12/24 BCx: sent   Daylene Posey, PharmD Clinical Pharmacist ED Pharmacist Phone # [phone] 08/11/2021 6:29 PM

## 2021-08-12 ENCOUNTER — Inpatient Hospital Stay (HOSPITAL_COMMUNITY): Payer: Medicare HMO

## 2021-08-12 ENCOUNTER — Encounter (HOSPITAL_COMMUNITY)
Admission: EM | Disposition: A | Discharge status: Skilled Nursing Facility | Payer: Self-pay | Source: Home / Self Care | Attending: Family Medicine

## 2021-08-12 ENCOUNTER — Inpatient Hospital Stay (HOSPITAL_COMMUNITY): Payer: Medicare HMO | Admitting: Anesthesiology

## 2021-08-12 ENCOUNTER — Encounter (HOSPITAL_COMMUNITY): Payer: Self-pay | Admitting: Family Medicine

## 2021-08-12 DIAGNOSIS — I70292 Other atherosclerosis of native arteries of extremities, left leg: Secondary | ICD-10-CM

## 2021-08-12 DIAGNOSIS — I739 Peripheral vascular disease, unspecified: Secondary | ICD-10-CM | POA: Diagnosis not present

## 2021-08-12 DIAGNOSIS — I70262 Atherosclerosis of native arteries of extremities with gangrene, left leg: Secondary | ICD-10-CM

## 2021-08-12 DIAGNOSIS — Z89511 Acquired absence of right leg below knee: Secondary | ICD-10-CM

## 2021-08-12 DIAGNOSIS — I96 Gangrene, not elsewhere classified: Secondary | ICD-10-CM

## 2021-08-12 DIAGNOSIS — A419 Sepsis, unspecified organism: Secondary | ICD-10-CM

## 2021-08-12 DIAGNOSIS — Z6835 Body mass index (BMI) 35.0-35.9, adult: Secondary | ICD-10-CM

## 2021-08-12 DIAGNOSIS — L0889 Other specified local infections of the skin and subcutaneous tissue: Secondary | ICD-10-CM

## 2021-08-12 DIAGNOSIS — E1151 Type 2 diabetes mellitus with diabetic peripheral angiopathy without gangrene: Secondary | ICD-10-CM

## 2021-08-12 HISTORY — PX: AMPUTATION: SHX166

## 2021-08-12 LAB — CBC
HCT: 36.5 % — ABNORMAL LOW (ref 39.0–52.0)
Hemoglobin: 11.7 g/dL — ABNORMAL LOW (ref 13.0–17.0)
MCH: 27 pg (ref 26.0–34.0)
MCHC: 32.1 g/dL (ref 30.0–36.0)
MCV: 84.1 fL (ref 80.0–100.0)
Platelets: 518 10*3/uL — ABNORMAL HIGH (ref 150–400)
RBC: 4.34 MIL/uL (ref 4.22–5.81)
RDW: 12.6 % (ref 11.5–15.5)
WBC: 13.2 10*3/uL — ABNORMAL HIGH (ref 4.0–10.5)
nRBC: 0 % (ref 0.0–0.2)

## 2021-08-12 LAB — SURGICAL PCR SCREEN
MRSA, PCR: NEGATIVE
Staphylococcus aureus: NEGATIVE

## 2021-08-12 LAB — VANCOMYCIN, RANDOM: Vancomycin Rm: 18

## 2021-08-12 LAB — GLUCOSE, CAPILLARY
Glucose-Capillary: 144 mg/dL — ABNORMAL HIGH (ref 70–99)
Glucose-Capillary: 138 mg/dL — ABNORMAL HIGH (ref 70–99)
Glucose-Capillary: 141 mg/dL — ABNORMAL HIGH (ref 70–99)
Glucose-Capillary: 162 mg/dL — ABNORMAL HIGH (ref 70–99)
Glucose-Capillary: 156 mg/dL — ABNORMAL HIGH (ref 70–99)

## 2021-08-12 LAB — BASIC METABOLIC PANEL
Anion gap: 10 (ref 5–15)
BUN: 14 mg/dL (ref 6–20)
CO2: 21 mmol/L — ABNORMAL LOW (ref 22–32)
Calcium: 8.1 mg/dL — ABNORMAL LOW (ref 8.9–10.3)
Chloride: 102 mmol/L (ref 98–111)
Creatinine, Ser: 1.47 mg/dL — ABNORMAL HIGH (ref 0.61–1.24)
GFR, Estimated: 56 mL/min — ABNORMAL LOW (ref >60)
Glucose, Bld: 157 mg/dL — ABNORMAL HIGH (ref 70–99)
Potassium: 3.6 mmol/L (ref 3.5–5.1)
Sodium: 133 mmol/L — ABNORMAL LOW (ref 135–145)

## 2021-08-12 SURGERY — AMPUTATION DIGIT
Anesthesia: General | Site: Foot | Laterality: Left

## 2021-08-12 MED ORDER — FENTANYL CITRATE (PF) 250 MCG/5ML IJ SOLN
INTRAMUSCULAR | Status: AC
  Filled 2021-08-12: qty 5

## 2021-08-12 MED ORDER — ORAL CARE MOUTH RINSE: 15.0000 mL | Freq: Once | OROMUCOSAL | Status: AC

## 2021-08-12 MED ORDER — SUCCINYLCHOLINE CHLORIDE 200 MG/10ML IV SOSY
PREFILLED_SYRINGE | INTRAVENOUS | Status: AC
  Filled 2021-08-12: qty 10

## 2021-08-12 MED ORDER — PROPOFOL 10 MG/ML IV BOLUS
INTRAVENOUS | Status: AC
  Filled 2021-08-12: qty 20

## 2021-08-12 MED ORDER — ONDANSETRON HCL 4 MG/2ML IJ SOLN
INTRAMUSCULAR | Status: DC | PRN
  Administered 2021-08-12: 4 mg via INTRAVENOUS

## 2021-08-12 MED ORDER — PHENYLEPHRINE 40 MCG/ML (10ML) SYRINGE FOR IV PUSH (FOR BLOOD PRESSURE SUPPORT)
PREFILLED_SYRINGE | INTRAVENOUS | Status: DC | PRN
  Administered 2021-08-12: 80 ug via INTRAVENOUS
  Administered 2021-08-12: 120 ug via INTRAVENOUS

## 2021-08-12 MED ORDER — 0.9 % SODIUM CHLORIDE (POUR BTL) OPTIME
TOPICAL | Status: DC | PRN
  Administered 2021-08-12: 14:00:00 1000 mL

## 2021-08-12 MED ORDER — LACTATED RINGERS IV SOLN: INTRAVENOUS | Status: AC

## 2021-08-12 MED ORDER — CARVEDILOL 12.5 MG PO TABS
12.5000 mg | ORAL_TABLET | Freq: Two times a day (BID) | ORAL | Status: DC
  Administered 2021-08-12 – 2021-08-18 (×13): 12.5 mg via ORAL
  Filled 2021-08-12 (×13): qty 1

## 2021-08-12 MED ORDER — GADOBUTROL 1 MMOL/ML IV SOLN
10.0000 mL | Freq: Once | INTRAVENOUS | Status: AC | PRN
  Administered 2021-08-12: 02:00:00 10 mL via INTRAVENOUS

## 2021-08-12 MED ORDER — OXYCODONE HCL 5 MG/5ML PO SOLN: 5.0000 mg | Freq: Once | ORAL | Status: DC | PRN

## 2021-08-12 MED ORDER — VASOPRESSIN 20 UNIT/ML IV SOLN
INTRAVENOUS | Status: AC
  Filled 2021-08-12: qty 1

## 2021-08-12 MED ORDER — FENTANYL CITRATE (PF) 250 MCG/5ML IJ SOLN
INTRAMUSCULAR | Status: DC | PRN
  Administered 2021-08-12: 50 ug via INTRAVENOUS

## 2021-08-12 MED ORDER — SUCCINYLCHOLINE CHLORIDE 200 MG/10ML IV SOSY
PREFILLED_SYRINGE | INTRAVENOUS | Status: DC | PRN
  Administered 2021-08-12: 140 mg via INTRAVENOUS

## 2021-08-12 MED ORDER — PROPOFOL 10 MG/ML IV BOLUS
INTRAVENOUS | Status: DC | PRN
  Administered 2021-08-12: 200 mg via INTRAVENOUS

## 2021-08-12 MED ORDER — PHENYLEPHRINE 40 MCG/ML (10ML) SYRINGE FOR IV PUSH (FOR BLOOD PRESSURE SUPPORT)
PREFILLED_SYRINGE | INTRAVENOUS | Status: AC
  Filled 2021-08-12: qty 10

## 2021-08-12 MED ORDER — LIDOCAINE 2% (20 MG/ML) 5 ML SYRINGE
INTRAMUSCULAR | Status: AC
  Filled 2021-08-12: qty 5

## 2021-08-12 MED ORDER — CHLORHEXIDINE GLUCONATE 0.12 % MT SOLN
OROMUCOSAL | Status: AC
  Administered 2021-08-12: 13:00:00 15 mL via OROMUCOSAL
  Filled 2021-08-12: qty 15

## 2021-08-12 MED ORDER — DEXAMETHASONE SODIUM PHOSPHATE 10 MG/ML IJ SOLN
INTRAMUSCULAR | Status: AC
  Filled 2021-08-12: qty 1

## 2021-08-12 MED ORDER — FENTANYL CITRATE (PF) 100 MCG/2ML IJ SOLN: 25.0000 ug | INTRAMUSCULAR | Status: DC | PRN

## 2021-08-12 MED ORDER — LIDOCAINE 2% (20 MG/ML) 5 ML SYRINGE
INTRAMUSCULAR | Status: DC | PRN
  Administered 2021-08-12: 60 mg via INTRAVENOUS

## 2021-08-12 MED ORDER — LACTATED RINGERS IV SOLN: INTRAVENOUS | Status: DC

## 2021-08-12 MED ORDER — MIDAZOLAM HCL 2 MG/2ML IJ SOLN
INTRAMUSCULAR | Status: AC
  Filled 2021-08-12: qty 2

## 2021-08-12 MED ORDER — LACTATED RINGERS IV BOLUS
1000.0000 mL | Freq: Once | INTRAVENOUS | Status: AC
  Administered 2021-08-12: 10:00:00 1000 mL via INTRAVENOUS

## 2021-08-12 MED ORDER — CHLORHEXIDINE GLUCONATE 0.12 % MT SOLN: 15.0000 mL | Freq: Once | OROMUCOSAL | Status: AC

## 2021-08-12 MED ORDER — ONDANSETRON HCL 4 MG/2ML IJ SOLN
INTRAMUSCULAR | Status: AC
  Filled 2021-08-12: qty 2

## 2021-08-12 MED ORDER — ONDANSETRON HCL 4 MG/2ML IJ SOLN: 4.0000 mg | Freq: Four times a day (QID) | INTRAMUSCULAR | Status: DC | PRN

## 2021-08-12 MED ORDER — OXYCODONE HCL 5 MG PO TABS: 5.0000 mg | ORAL_TABLET | Freq: Once | ORAL | Status: DC | PRN

## 2021-08-12 MED ORDER — VANCOMYCIN HCL 1250 MG/250ML IV SOLN
1250.0000 mg | Freq: Two times a day (BID) | INTRAVENOUS | Status: DC
Start: 2021-08-13 — End: 2021-08-14
  Administered 2021-08-13 – 2021-08-14 (×3): 1250 mg via INTRAVENOUS
  Filled 2021-08-12 (×2): qty 250

## 2021-08-12 MED ORDER — CARVEDILOL 12.5 MG PO TABS
12.5000 mg | ORAL_TABLET | Freq: Two times a day (BID) | ORAL | Status: DC
Start: 2021-08-12 — End: 2021-08-12

## 2021-08-12 SURGICAL SUPPLY — 32 items
BAG COUNTER SPONGE SURGICOUNT (BAG) ×2 IMPLANT
BLADE AVERAGE 25X9 (BLADE) IMPLANT
BLADE SURG 21 STRL SS (BLADE) ×2 IMPLANT
BNDG COHESIVE 6X5 TAN NS LF (GAUZE/BANDAGES/DRESSINGS) ×1 IMPLANT
BNDG CONFORM 3 STRL LF (GAUZE/BANDAGES/DRESSINGS) ×2 IMPLANT
BNDG ELASTIC 4X5.8 VLCR STR LF (GAUZE/BANDAGES/DRESSINGS) ×2 IMPLANT
BNDG GAUZE ELAST 4 BULKY (GAUZE/BANDAGES/DRESSINGS) ×2 IMPLANT
CANISTER SUCT 3000ML PPV (MISCELLANEOUS) ×2 IMPLANT
COVER SURGICAL LIGHT HANDLE (MISCELLANEOUS) ×2 IMPLANT
DRAPE HALF SHEET 40X57 (DRAPES) ×2 IMPLANT
DRAPE ORTHO SPLIT 77X108 STRL (DRAPES) ×4
DRAPE SURG ORHT 6 SPLT 77X108 (DRAPES) ×2 IMPLANT
ELECT REM PT RETURN 9FT ADLT (ELECTROSURGICAL) ×2
ELECTRODE REM PT RTRN 9FT ADLT (ELECTROSURGICAL) ×1 IMPLANT
GAUZE SPONGE 4X4 12PLY STRL (GAUZE/BANDAGES/DRESSINGS) ×2 IMPLANT
GAUZE XEROFORM 1X8 LF (GAUZE/BANDAGES/DRESSINGS) ×2 IMPLANT
GLOVE SURG POLYISO LF SZ8 (GLOVE) ×2 IMPLANT
GOWN STRL REUS W/ TWL LRG LVL3 (GOWN DISPOSABLE) ×2 IMPLANT
GOWN STRL REUS W/ TWL XL LVL3 (GOWN DISPOSABLE) ×1 IMPLANT
GOWN STRL REUS W/TWL LRG LVL3 (GOWN DISPOSABLE) ×4
GOWN STRL REUS W/TWL XL LVL3 (GOWN DISPOSABLE) ×2
KIT BASIN OR (CUSTOM PROCEDURE TRAY) ×2 IMPLANT
KIT TURNOVER KIT B (KITS) ×2 IMPLANT
NS IRRIG 1000ML POUR BTL (IV SOLUTION) ×2 IMPLANT
PACK GENERAL/GYN (CUSTOM PROCEDURE TRAY) ×2 IMPLANT
PAD ARMBOARD 7.5X6 YLW CONV (MISCELLANEOUS) ×4 IMPLANT
SUT ETHILON 2 0 PSLX (SUTURE) ×2 IMPLANT
SUT ETHILON 3 0 PS 1 (SUTURE) IMPLANT
TOWEL GREEN STERILE (TOWEL DISPOSABLE) ×4 IMPLANT
TOWEL GREEN STERILE FF (TOWEL DISPOSABLE) ×2 IMPLANT
UNDERPAD 30X36 HEAVY ABSORB (UNDERPADS AND DIAPERS) ×2 IMPLANT
WATER STERILE IRR 1000ML POUR (IV SOLUTION) ×2 IMPLANT

## 2021-08-12 NOTE — Progress Notes (Signed)
Pharmacy Antibiotic Note  Roy Taylor is a 55 y.o. male admitted on 08/11/2021 diabetic L foot wound with PMH significant for diabetes and PVD s/p amputation of RLE.  Pharmacy has been consulted for Vancomycin and Cefepime dosing.   Creatinine 0.94 > 1.47.  Giving IV fluids.  Vancomycin 2500 mg IV loading dose given ~5pm on 12/24 and fist maintenance dose of 1500 mg IV q12h given at 0322 today.   May need to change to q24h dosing interval.    Lisinopril 20 mg give 12/24 > discontinued this morning.  Plan: Check random Vanc level ~12 hrs after last dose to help guide dosing. Next dose deferred til 8pm tonight for now. Goal AUC 400-550. Expected AUC: 491, based on Scr 0.94 and Vd 0.5L/kg  Continue Cefepime 2 gm IV q8hrs. Also on Clindamycin 600 mg IV q8hrs. Follow renal function, culture data, clinical progress, antibiotic plans, and VVS plans.   Height: 6' 3.5" (191.8 cm) Weight: 130.6 kg (288 lb) IBW/kg (Calculated) : 85.65  Temp (24hrs), Avg:100.6 F (38.1 C), Min:99 F (37.2 C), Max:101.8 F (38.8 C)  Recent Labs  Lab 08/11/21 0900 08/11/21 0919 08/12/21 0508  WBC 13.0*  --  13.2*  CREATININE 0.94  --  1.47*  LATICACIDVEN  --  1.2  --     Estimated Creatinine Clearance: 84.3 mL/min (A) (by C-G formula based on SCr of 1.47 mg/dL (H)).    No Known Allergies  Antimicrobials this admission: Vancomycin 12/24 >>  Zosyn 12/24 x 1 Cefepime 12/24>> Clindamycin 12/24>>   Dose adjustments this admission:  Vanc random level later today > creatinine increased to 1.47  Microbiology results: 12/24 Blood: sent 12/24 COVID and flu: negative  Thank you for allowing pharmacy to be a part of this patients care.  Dennie Fetters 08/12/2021 10:48 AM

## 2021-08-12 NOTE — Progress Notes (Signed)
Pt states he does not need help with putting on the CPAP but will call if he has any issues with it tonight.

## 2021-08-12 NOTE — Progress Notes (Signed)
ABI w/ TBI study completed.   Please see CV Proc for preliminary results.   Cintia Gleed, RDMS, RVT  

## 2021-08-12 NOTE — Progress Notes (Addendum)
Family Medicine Teaching Service Daily Progress Note Intern Pager: [phone]  Patient name: BURGESS SHERIFF Medical record number: [id number] Date of birth: [birth year] Age: 55 y.o. Gender: male  Primary Care Provider: Renaldo Harrison, DO Consultants: VVS Code Status: Full  Pt Overview and Major Events to Date:  12/24 - Admitted with gangrene, VVS consulted  Assessment and Plan: JOSTIN RUE is a 55 y.o. 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 MRI of the left foot was completed, but has not yet been read. LLE angiogram on 12/27. If patient worsens clinically, will likely proceed with urgent amputation. - VVS consulted, appreciate recs - ABI's ordered - Continue Vanc, Cefepime, Clindamycin - LR @ 224mL/hr - Follow-up blood cultures - PT/OT eval and treat - Awaiting MRI reading of L foot - Pain control: Tylenol 650mg  q6h PRN Oxycodone 5mg  q6h PRN for breakthrough - ASA 81mg  daily  AKI Creatinine 0.94>1.47, baseline appears to be around 0.9-1. Initial antibiotics of vanc and zosyn may have contributed, zosyn has been replaced with cefepime and clindamycin and patient's fluids have been increased.  - LR @ 246mL/hr - Continue to monitor creatinine daily  T2DM with PAD A1c 9.1, CBGs have been <180 and patient has received a total of 4 Units of SSI since admission. Diet is heart healthy/carb modified. Will continue to closely monitor CBGs and address as appropriate.  - Holding home farxiga, metformin, levemir - CBGs QAC and QHS - Diabetic coordinator consulted for education - Moderate SSI  Mild hyponatremia Na 135>133 (corrected to 134). Patient is currently receiving LR @200mL /hr due to AKI. We  will continue to monitor sodium levels with BMP - Daily BMP   Hypocalcemia Mild hypocalcemia with Ca 8.1, patient appears to be chronically low. Cannot do calculation for albumin as it has not been measured this hospitalization, though upon chart review, patient does appear to have a baseline low albumin level. Anticipate that this may correct and will collect CMP in the morning.  - CMP in the AM  HTN BP since admission 128-190/63-108. Creatinine elevated at 1.47, will hold home lisinopril due to AKI.  - Holding home Lisinopril - Restarting home coreg 12.5mg  BID - Continue to monitor BP  HLD Lipid panel on admission: LDL 141, HDL 33, total cholesterol .  - Rosuvastatin 10mg  QHS  OSA - CPAP nightly    FEN/GI: Heart healthy/carb modified PPx: Heparin Dispo:Home pending clinical improvement . Barriers include continued medical work-up.   Subjective:  Patient reports that he is doing alright this morning. He only has pain when people press on  his foot and that is mainly a throbbing type of pain. He has no other concerns at this time is aware of the plan with vascular surgery.  Objective: Temp:  [98.1 F (36.7 C)-101.8 F (38.8 C)] 100.8 F (38.2 C) (12/25 0824) Pulse Rate:  [93-112] 103 (12/25 0824) Resp:  [16-20] 17 (12/25 0824) BP: (128-190)/(63-108) 170/107 (12/25 0824) SpO2:  [92 %-99 %] 99 % (12/25 0824) Weight:  [130.6 kg] 130.6 kg (12/24 1010) Physical Exam: General: NAD, laying in bed resting comfortably Cardiovascular: RRR, no murmur appreciated Respiratory: breathing comfortably on room air, CTAB Abdomen: soft, non-tender, non-distended Extremities: left great  toe and second toe gangrenous in appearance with tenderness to palpation along the midfoot and foul odor present. Well-healing R BKA noted.  Laboratory: Recent Labs  Lab 08/11/21 0900 08/12/21 0508  WBC 13.0* 13.2*  HGB 13.0 11.7*  HCT 40.8 36.5*  PLT 600* 518*   Recent Labs  Lab 08/11/21 0900  08/12/21 0508  NA 135 133*  K 3.4* 3.6  CL 100 102  CO2 25 21*  BUN 10 14  CREATININE 0.94 1.47*  CALCIUM 8.6* 8.1*  PROT 7.0  --   BILITOT 0.6  --   ALKPHOS 89  --   ALT 19  --   AST 21  --   GLUCOSE 150* 157*     Imaging/Diagnostic Tests: DG Foot Complete Left  Result Date: 08/11/2021 CLINICAL DATA:  55 year old diabetic male with left foot wound to the great toe for 2 weeks. EXAM: LEFT FOOT - COMPLETE 3+ VIEW COMPARISON:  None. FINDINGS: Abnormal soft tissue swelling and soft tissue gas about the great toe distal phalanx. But no underlying osteolysis. No fracture or dislocation. Calcified peripheral vascular disease and dystrophic or vascular calcifications also anterior to the tibia. Cuboid accessory ossicle. No acute osseous abnormality identified. IMPRESSION: Soft tissue swelling and gas about the left great toe distal phalanx. Cannot exclude a gas-forming infection, but no radiographic evidence of osteomyelitis. Electronically Signed   By: Odessa Fleming M.D.   On: 08/11/2021 09:52     Evelena Leyden, DO 08/12/2021, 9:00 AM PGY-2, [facility] FPTS Intern pager: [phone], text pages welcome

## 2021-08-12 NOTE — Progress Notes (Addendum)
[facility] [place] PROGRESS NOTE  ASSESSMENT / PLAN: Roy Taylor is a 55 y.o. male with diabetic foot infection. Persistent fever / tachycardia despite broad spectrum Abx. Plan urgent open foot amputation today in OR given severe sepsis. NPO. Orders for consent written.   SUBJECTIVE: Feels a bit better. Explained VS trends. Explained rationale for foot amputation.  OBJECTIVE: BP (!) 166/94 (BP Location: Right Arm)    Pulse (!) 102    Temp 99 F (37.2 C) (Oral)    Resp 18    Ht 6' 3.5" (1.918 m)    Wt 130.6 kg    SpO2 99%    BMI 35.52 kg/m   Intake/Output Summary (Last 24 hours) at 08/12/2021 1123 Last data filed at 08/12/2021 0553 Gross per 24 hour  Intake 2322.88 ml  Output 300 ml  Net 2022.88 ml    No acute distress. RRR Unlabored L foot with gangrene about first / second toe. Malodor. Similar to yesterday.   CBC Latest Ref Rng & Units 08/12/2021 08/11/2021 11/18/2020  WBC 4.0 - 10.5 K/uL 13.2(H) 13.0(H) 7.6  Hemoglobin 13.0 - 17.0 g/dL 11.7(L) 13.0 11.0(L)  Hematocrit 39.0 - 52.0 % 36.5(L) 40.8 33.4(L)  Platelets 150 - 400 K/uL 518(H) 600(H) 661(H)     CMP Latest Ref Rng & Units 08/12/2021 08/11/2021 11/19/2020  Glucose 70 - 99 mg/dL 067(P) 034(K) 352(Y)  BUN 6 - 20 mg/dL 14 10 12   Creatinine 0.61 - 1.24 mg/dL ) 8.18(H 9.09  Sodium 135 - 145 mmol/L 133(L) 135 132(L)  Potassium 3.5 - 5.1 mmol/L 3.6 3.4(L) 3.6  Chloride 98 - 111 mmol/L 102 100 99  CO2 22 - 32 mmol/L 21(L) 25 26  Calcium 8.9 - 10.3 mg/dL 8.1(L) 8.6(L) 8.5(L)  Total Protein 6.5 - 8.1 g/dL - 7.0 -  Total Bilirubin 0.3 - 1.2 mg/dL - 0.6 -  Alkaline Phos 38 - 126 U/L - 89 -  AST 15 - 41 U/L - 21 -  ALT 0 - 44 U/L - 19 -    Estimated Creatinine Clearance: 84.3 mL/min (A) (by C-G formula based on SCr of 1.47 mg/dL (H)).  3.11. Rande Brunt, MD [facility] Phone Number: [phone] 08/12/2021 11:23 AM

## 2021-08-12 NOTE — Progress Notes (Signed)
Called MRI again, tech reports a number of STAT orders that she is working through. He is on the list and will hopefully be done tonight.   Fayette Pho, MD PGY-2, [facility] Family Medicine Service pager [phone]

## 2021-08-12 NOTE — Anesthesia Procedure Notes (Addendum)
Procedure Name: Intubation Date/Time: 08/12/2021 1:28 PM Performed by: Kyung Rudd, CRNA Pre-anesthesia Checklist: Patient identified, Emergency Drugs available, Suction available and Patient being monitored Patient Re-evaluated:Patient Re-evaluated prior to induction Oxygen Delivery Method: Circle system utilized Preoxygenation: Pre-oxygenation with 100% oxygen Induction Type: IV induction, Rapid sequence and Cricoid Pressure applied Laryngoscope Size: Mac and 4 Grade View: Grade I Tube type: Oral Tube size: 7.5 mm Number of attempts: 1 Airway Equipment and Method: Stylet Placement Confirmation: ETT inserted through vocal cords under direct vision, positive ETCO2 and breath sounds checked- equal and bilateral Secured at: 22 cm Tube secured with: Tape Dental Injury: Teeth and Oropharynx as per pre-operative assessment

## 2021-08-12 NOTE — Progress Notes (Addendum)
FPTS Interim Progress Note  S: Went to patient room for bedside check after the procedure this afternoon. Patient states that he is doing okay and is not having any issues with pain at the moment. He is aware of the plan for the arteriogram on Tuesday and has no complaints or concerns at this time. He is not having any chest pain or feeling palpations/tachycardia.  After my initial discussion, a page came through that the patient's telemetry showed some ST elevations and I went back in to re-evaluation the patient.   O: BP (!) 172/91 (BP Location: Right Arm)    Pulse (!) 105    Temp 99.8 F (37.7 C) (Oral)    Resp 20    Ht 6' 3.5" (1.918 m)    Wt 130.6 kg    SpO2 99%    BMI 35.52 kg/m   General: sitting up in bed, lab at bedside to draw blood CV: tachycardia, on telemetry strips there is artifact but some appearance of an elevation Respiratory: breathing comfortably on room air, no increased WOB, speaking in full sentences  A/P: Tachycardia Reviewed the telemetry strip with artifact that was concerning for possible ST elevation. Patient is asymptomatic and has no complaints. Will get further evaluation starting with a stat EKG and determine if Troponin is necessary at that time. Discussed plan with patient's nurse, who will notify the team when the EKG has been done. - STAT EKG - Will obtain troponin if abnormal  Bulah Lurie, DO 08/12/2021, 5:58 PM PGY-2, [facility] Family Medicine Service pager [phone]

## 2021-08-12 NOTE — Transfer of Care (Signed)
Immediate Anesthesia Transfer of Care Note  Patient: Roy Taylor  Procedure(s) Performed: LEFT OPEN GREAT TOE AMPUTATION (Left: Foot)  Patient Location: PACU  Anesthesia Type:General  Level of Consciousness: awake, alert  and oriented  Airway & Oxygen Therapy: Patient Spontanous Breathing  Post-op Assessment: Report given to RN, Post -op Vital signs reviewed and stable and Patient moving all extremities  Post vital signs: Reviewed and stable  Last Vitals:  Vitals Value Taken Time  BP    Temp    Pulse    Resp    SpO2      Last Pain:  Vitals:   08/12/21 1154  TempSrc: Oral  PainSc:       Patients Stated Pain Goal: 0 (08/12/21 0547)  Complications: No notable events documented.

## 2021-08-12 NOTE — Progress Notes (Signed)
Pharmacy Antibiotic Note  Roy Taylor is a 55 y.o. male admitted on 08/11/2021 diabetic L foot wound with PMH significant for diabetes and PVD s/p amputation of RLE.  Pharmacy has been consulted for Vancomycin and Cefepime dosing.  A vancomycin random level was checked today in the setting of rising creatinine which is therapeutic at 18.   Plan: Adjust vancomycin dose to 1250 mg IV Q 12 hours and give next dose at 1 AM tomorrow  Goal AUC 400-550. Expected AUC: 522, based on Scr 1.47 and Vd 0.5L/kg  Continue Cefepime 2 gm IV q8hrs. Also on Clindamycin 600 mg IV q8hrs. Follow renal function, culture data, clinical progress, antibiotic plans, and VVS plans.   Height: 6' 3.5" (191.8 cm) Weight: 130.6 kg (288 lb) IBW/kg (Calculated) : 85.65  Temp (24hrs), Avg:99.8 F (37.7 C), Min:99 F (37.2 C), Max:100.8 F (38.2 C)  Recent Labs  Lab 08/11/21 0900 08/11/21 0919 08/12/21 0508 08/12/21 1804  WBC 13.0*  --  13.2*  --   CREATININE 0.94  --  1.47*  --   LATICACIDVEN  --  1.2  --   --   VANCORANDOM  --   --   --  18     Estimated Creatinine Clearance: 84.3 mL/min (A) (by C-G formula based on SCr of 1.47 mg/dL (H)).    No Known Allergies  Antimicrobials this admission: Vancomycin 12/24 >>  Zosyn 12/24 x 1 Cefepime 12/24>> Clindamycin 12/24>>   Dose adjustments this admission: 12/25: 15 hr vanc random was 18 on vanc 1500 mg IV Q 12 hours > adjust to vancomycin 1250 mg IV Q 12 hours   Microbiology results: 12/24 Blood: sent 12/24 COVID and flu: negative  Thank you for allowing pharmacy to be a part of this patients care.  Vinnie Level, PharmD., BCPS, BCCCP Clinical Pharmacist Please refer to [facility] for unit-specific pharmacist

## 2021-08-12 NOTE — Progress Notes (Signed)
FPTS Brief Progress Note  S:Mr. Roy Taylor is laying in bed, wife is present at the bedside. He reports that he is doing well. Notes that it is hard to rest when people are coming in and out of his room.   O: BP (!) 141/66 (BP Location: Left Arm)    Pulse (!) 102    Temp 99.1 F (37.3 C) (Oral)    Resp 18    Ht 6' 3.5" (1.918 m)    Wt 130.6 kg    SpO2 97%    BMI 35.52 kg/m   Gen: Pleasant, in no distress, laying in bed. Wife at the bedside  Resp: Breathing comfortably on room air Ext: S/p right BKA. Left foot bandaged   A/P: Gangrene of Left Foot s/p left open first toe amputation  Pain seems well-controlled post-surgery. Patient appears comfortable.  -Continue current plan - Orders reviewed. Labs for AM ordered, which was adjusted as needed.   Sabino Dick, DO 08/12/2021, 9:43 PM PGY-2, [place] [facility] Night Resident  Please page [phone] with questions.

## 2021-08-12 NOTE — Progress Notes (Signed)
PT Cancellation Note  Patient Details Name: ELIAM SNAPP MRN: [id number] DOB: 09-Apr-1966   Cancelled Treatment:    Reason Eval/Treat Not Completed: Patient at procedure or test/unavailable. Pt off unit for ABI, plan for urgent foot amputation today. PT will follow up after surgery when pt is medically appropriate to mobilize.   Arlyss Gandy 08/12/2021, 11:45 AM

## 2021-08-12 NOTE — Anesthesia Preprocedure Evaluation (Signed)
Anesthesia Evaluation  Patient identified by MRN, date of birth, ID band Patient awake    Reviewed: Allergy & Precautions, H&P , NPO status , Patient's Chart, lab work & pertinent test results  Airway Mallampati: II   Neck ROM: full    Dental   Pulmonary sleep apnea ,    breath sounds clear to auscultation       Cardiovascular hypertension, + Peripheral Vascular Disease   Rhythm:regular Rate:Normal     Neuro/Psych  Neuromuscular disease    GI/Hepatic   Endo/Other  diabetes, Type 2  Renal/GU      Musculoskeletal  (+) Arthritis ,   Abdominal   Peds  Hematology   Anesthesia Other Findings   Reproductive/Obstetrics                             Anesthesia Physical Anesthesia Plan  ASA: 3 and emergent  Anesthesia Plan: General   Post-op Pain Management:    Induction: Intravenous, Rapid sequence and Cricoid pressure planned  PONV Risk Score and Plan: 2 and Ondansetron, Dexamethasone, Midazolam and Treatment may vary due to age or medical condition  Airway Management Planned: Oral ETT  Additional Equipment:   Intra-op Plan:   Post-operative Plan: Extubation in OR  Informed Consent: I have reviewed the patients History and Physical, chart, labs and discussed the procedure including the risks, benefits and alternatives for the proposed anesthesia with the patient or authorized representative who has indicated his/her understanding and acceptance.     Dental advisory given  Plan Discussed with: CRNA, Anesthesiologist and Surgeon  Anesthesia Plan Comments:         Anesthesia Quick Evaluation

## 2021-08-12 NOTE — Progress Notes (Signed)
Discussed the case with VVS (Dr. Lenell Antu) regarding patient's severe sepsis status and consideration to proceed sooner with source control. Patient's disease is likely to be similar to his other leg and there may be issues with his healing and reconstruction. Given his clinical picture, feel that it is urgent for this patient to get source control with an open amputation. Patient did eat this morning but is now NPO with plans for the OR this afternoon for URGENT amputation.   Laquesha Holcomb, DO

## 2021-08-12 NOTE — Op Note (Signed)
DATE OF SERVICE: 08/12/2021  PATIENT:  Roy Taylor  55 y.o. male  PRE-OPERATIVE DIAGNOSIS:  left diabetic foot infection causing severe sepsis  POST-OPERATIVE DIAGNOSIS:  Same  PROCEDURE:   left open first toe amputation  SURGEON:  Surgeon(s) and Role:    * Leonie Douglas, MD - Primary  ASSISTANT: none  ANESTHESIA:   general  EBL: minimal  BLOOD ADMINISTERED:none  DRAINS: none   LOCAL MEDICATIONS USED:  NONE  SPECIMEN:  culture / gram stain of surgical bed  COUNTS: confirmed correct.  TOURNIQUET:  none  PATIENT DISPOSITION:  PACU - hemodynamically stable.   Delay start of Pharmacological VTE agent (>24hrs) due to surgical blood loss or risk of bleeding: no  INDICATION FOR PROCEDURE: [name] is a 55 y.o. male with left foot diabetic foot infection and evidence of severe sepsis. After careful discussion of risks, benefits, and alternatives the patient was offered open amputation of the great toe. The patient understood and wished to proceed.  OPERATIVE FINDINGS: left great toe amputated at the MTP joint. Devitalized soft tissue throughout. Debrided to healthy appearing tissue. Minimal bleeding.   DESCRIPTION OF PROCEDURE: After identification of the patient in the pre-operative holding area, the patient was transferred to the operating room. The patient was positioned supine on the operating room table. Anesthesia was induced. The left leg was prepped and draped in standard fashion. A surgical pause was performed confirming correct patient, procedure, and operative location.  An incision was made about the great toe with a 21 blade. The incision was carried down sharply to the first metatarsal-phalangeal joint. The toe was disarticulated and passed off the field. Cultures were taken. Devitalized tissue was sharply debrided to healthy appearing tissue. The wound was packed open with wet-to-dry dressing.    Upon completion of the case instrument and sharps counts were  confirmed correct. The patient was transferred to the PACU in good condition. I was present for all portions of the procedure.  Rande Brunt. Lenell Antu, MD [facility] Phone Number: [phone] 08/12/2021 1:51 PM

## 2021-08-13 ENCOUNTER — Encounter (HOSPITAL_COMMUNITY): Payer: Self-pay | Admitting: Vascular Surgery

## 2021-08-13 DIAGNOSIS — Z89412 Acquired absence of left great toe: Secondary | ICD-10-CM

## 2021-08-13 LAB — CBC
HCT: 31.9 % — ABNORMAL LOW (ref 39.0–52.0)
Hemoglobin: 10.3 g/dL — ABNORMAL LOW (ref 13.0–17.0)
MCH: 26.5 pg (ref 26.0–34.0)
MCHC: 32.3 g/dL (ref 30.0–36.0)
MCV: 82.2 fL (ref 80.0–100.0)
Platelets: 491 10*3/uL — ABNORMAL HIGH (ref 150–400)
RBC: 3.88 MIL/uL — ABNORMAL LOW (ref 4.22–5.81)
RDW: 12.5 % (ref 11.5–15.5)
WBC: 14.5 10*3/uL — ABNORMAL HIGH (ref 4.0–10.5)
nRBC: 0 % (ref 0.0–0.2)

## 2021-08-13 LAB — COMPREHENSIVE METABOLIC PANEL
ALT: 15 U/L (ref 0–44)
AST: 19 U/L (ref 15–41)
Albumin: 1.6 g/dL — ABNORMAL LOW (ref 3.5–5.0)
Alkaline Phosphatase: 89 U/L (ref 38–126)
Anion gap: 10 (ref 5–15)
BUN: 15 mg/dL (ref 6–20)
CO2: 22 mmol/L (ref 22–32)
Calcium: 7.9 mg/dL — ABNORMAL LOW (ref 8.9–10.3)
Chloride: 100 mmol/L (ref 98–111)
Creatinine, Ser: 1.64 mg/dL — ABNORMAL HIGH (ref 0.61–1.24)
GFR, Estimated: 49 mL/min — ABNORMAL LOW (ref >60)
Glucose, Bld: 160 mg/dL — ABNORMAL HIGH (ref 70–99)
Potassium: 3.7 mmol/L (ref 3.5–5.1)
Sodium: 132 mmol/L — ABNORMAL LOW (ref 135–145)
Total Bilirubin: 0.8 mg/dL (ref 0.3–1.2)
Total Protein: 5.7 g/dL — ABNORMAL LOW (ref 6.5–8.1)

## 2021-08-13 LAB — GLUCOSE, CAPILLARY
Glucose-Capillary: 146 mg/dL — ABNORMAL HIGH (ref 70–99)
Glucose-Capillary: 169 mg/dL — ABNORMAL HIGH (ref 70–99)
Glucose-Capillary: 154 mg/dL — ABNORMAL HIGH (ref 70–99)
Glucose-Capillary: 167 mg/dL — ABNORMAL HIGH (ref 70–99)
Glucose-Capillary: 178 mg/dL — ABNORMAL HIGH (ref 70–99)

## 2021-08-13 LAB — TROPONIN I (HIGH SENSITIVITY): Troponin I (High Sensitivity): 25 ng/L — ABNORMAL HIGH (ref <18)

## 2021-08-13 MED ORDER — ENSURE ENLIVE PO LIQD
237.0000 mL | ORAL | Status: DC
  Administered 2021-08-13 – 2021-08-17 (×2): 237 mL via ORAL

## 2021-08-13 MED ORDER — SODIUM CHLORIDE 0.9 % IV SOLN: INTRAVENOUS | Status: AC

## 2021-08-13 MED ORDER — JUVEN PO PACK
1.0000 | PACK | Freq: Two times a day (BID) | ORAL | Status: DC
  Administered 2021-08-13 – 2021-08-21 (×14): 1 via ORAL
  Filled 2021-08-13 (×14): qty 1

## 2021-08-13 MED ORDER — ADULT MULTIVITAMIN W/MINERALS CH
1.0000 | ORAL_TABLET | Freq: Every day | ORAL | Status: DC
  Administered 2021-08-13 – 2021-08-21 (×8): 1 via ORAL
  Filled 2021-08-13 (×8): qty 1

## 2021-08-13 MED ORDER — ACETAMINOPHEN 325 MG PO TABS
650.0000 mg | ORAL_TABLET | Freq: Four times a day (QID) | ORAL | Status: DC
  Administered 2021-08-13 – 2021-08-21 (×29): 650 mg via ORAL
  Filled 2021-08-13 (×29): qty 2

## 2021-08-13 NOTE — Progress Notes (Signed)
[facility] OF [place] PROGRESS NOTE  ASSESSMENT / PLAN: Roy Taylor is a 55 y.o. male with diabetic foot infection s/p open left great toe amputation. VS improved. Plan LLE angiogram tomorrow. Will change dressing tomorrow. Reviewed plan with patient.   SUBJECTIVE: Feels much better.  OBJECTIVE: BP 129/76 (BP Location: Right Arm)    Pulse 88    Temp 98 F (36.7 C) (Oral)    Resp 18    Ht 6' 3.5" (1.918 m)    Wt 130.6 kg    SpO2 97%    BMI 35.52 kg/m   Intake/Output Summary (Last 24 hours) at 08/13/2021 1029 Last data filed at 08/12/2021 1410 Gross per 24 hour  Intake 400 ml  Output 10 ml  Net 390 ml     No acute distress. RRR Unlabored L foot dressing clean and dry  CBC Latest Ref Rng & Units 08/13/2021 08/12/2021 08/11/2021  WBC 4.0 - 10.5 K/uL 14.5(H) 13.2(H) 13.0(H)  Hemoglobin 13.0 - 17.0 g/dL 10.3(L) 11.7(L) 13.0  Hematocrit 39.0 - 52.0 % 31.9(L) 36.5(L) 40.8  Platelets 150 - 400 K/uL 491(H) 518(H) 600(H)     CMP Latest Ref Rng & Units 08/13/2021 08/12/2021 08/11/2021  Glucose 70 - 99 mg/dL 867(E) 720(N) 470(J)  BUN 6 - 20 mg/dL 15 14 10   Creatinine 0.61 - 1.24 mg/dL ) 6.28(Z) 6.62(H  Sodium 135 - 145 mmol/L 132(L) 133(L) 135  Potassium 3.5 - 5.1 mmol/L 3.7 3.6 3.4(L)  Chloride 98 - 111 mmol/L 100 102 100  CO2 22 - 32 mmol/L 22 21(L) 25  Calcium 8.9 - 10.3 mg/dL 7.9(L) 8.1(L) 8.6(L)  Total Protein 6.5 - 8.1 g/dL 4.76) - 7.0  Total Bilirubin 0.3 - 1.2 mg/dL 0.8 - 0.6  Alkaline Phos 38 - 126 U/L 89 - 89  AST 15 - 41 U/L 19 - 21  ALT 0 - 44 U/L 15 - 19    Estimated Creatinine Clearance: 75.5 mL/min (A) (by C-G formula based on SCr of 1.64 mg/dL (H)).  5.4(Y. Rande Brunt, MD [facility] Phone Number: [phone] 08/13/2021 10:29 AM

## 2021-08-13 NOTE — Progress Notes (Signed)
PT Cancellation Note  Patient Details Name: Roy Taylor MRN: [id number] DOB: 01-09-1966   Cancelled Treatment:    Reason Eval/Treat Not Completed: Patient declined, no reason specified Per OT, pt declining any mobility today and wants to wait until after procedure. Will follow up post op.   Roy Taylor 08/13/2021, 12:20 PM [place], PT, DPT Acute Rehabilitation Services Pager [phone] Office [phone]

## 2021-08-13 NOTE — Discharge Instructions (Signed)

## 2021-08-13 NOTE — Progress Notes (Signed)
Pt stated he doesn't want to wear CPAP 

## 2021-08-13 NOTE — Progress Notes (Signed)
Patient continues to have ST elevation on telemetry. No c/o chest pain or SOB. Attending aware, no further orders at this time.

## 2021-08-13 NOTE — Progress Notes (Signed)
Family Medicine Teaching Service Daily Progress Note Intern Pager: [phone]  Patient name: Roy Taylor Medical record number: [id number] Date of birth: 1966/06/15 Age: 55 y.o. Gender: male  Primary Care Provider: Renaldo Harrison, DO Consultants: VVS Code Status: Full  Pt Overview and Major Events to Date:  12/24 - Admitted 12/25 - L open first toe amputation  Assessment and Plan: Roy Taylor is a 55 y.o. 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   Gangrene of the left foot w/severe sepsis   S/p left open first toe amputation Patient is now POD#1 from first toe amputation, which was completed due to need for immediate source control with severe sepsis picture. Cultures of the site were collected and gram stain showed few gram negative rods and gram positive cocci (reincubated for better growth). Blood cultures NGTD x2 days. ABI of the left foot showed moderate lower extremity arterial disease. Plan for arteriogram on 12/27.  - VVS following, appreciate recommendations - Follow-up ABIs - Continue Vanc, Cefepime, Clindamycin - NS @170mL /hr - Following blood cultures - PT/OT eval and treat - Pain control:  Tylenol 650mg  q6h PRN  Oxycodone IR 5mg  q6h PRN for breakthrough - ASA 81mg  daily  Intermittent tachycardia Most recent vitals with HR 88. Sinus tachycardia overnight, EKGs were obtained as well as troponin, which was 25. EKG did show concerns of ST elevations in V1-V4 and we will closely monitor with telemetry and consider repeat EKG. Do note that admission EKG on 12/24 showed some similar elevations. - Continue telemetry - Monitor for clinical symptoms of ACS - Consider repeat EKG - Continue coreg 12.5mg  BID  HTN BP overnight mildly elevated with most recent reading 129/76. - Continue to monitor closely - continue coreg 12.5mg  BID  AKI Creatinine 1.47>1.64,  patient did have surgical procedure yesterday. Will place on fluids and continue to monitor. - BMP in the AM - NS @ 142mL/hr  T2DM with PAD CBGs 138-162 in the last 24h and has received a total of 5U of Novolog. Will continue to closely monitor. - RD consulted - Diabetic coordinator consulted - CBGs with meals and nightly - Continue moderate SSI  HLD - Continue Rosuvastatin 10mg  QHS  OSA Patient was given CPAP, but did not use it overnight.  - CPAP nightly   FEN/GI: carb modified PPx: Heparin Dispo:Home pending clinical improvement . Barriers include continued medical work-up.   Subjective:  Patient reports that he is feeling well this morning. He has some pain when his foot is palpated, but otherwise is doing okay. He has not had a bowel movement in 2 days but is not having any complaints as he has not been eating much either.   Objective: Temp:  [98 F (36.7 C)-100.2 F (37.9 C)] 98 F (36.7 C) (12/26 0810) Pulse Rate:  [88-105] 88 (12/26 0810) Resp:  [17-25] 18 (12/26 0810) BP: (126-172)/(66-94) 129/76 (12/26 0810) SpO2:  [97 %-99 %] 97 % (12/26 0810) Physical Exam: General: NAD, sitting up in bed, family members at bedside Cardiovascular: RRR, somewhat distant heard sounds secondary to habitus Respiratory: breathing comfortably on room air, no respiratory distress, no focality to auscultation Abdomen: soft, non-tender, non-distended Extremities: R BKA, L first toe amputation with CDI dressing covered with wrapping.   Laboratory: Recent Labs  Lab 08/11/21 0900 08/12/21 0508 08/13/21 0245  WBC 13.0* 13.2* 14.5*  HGB 13.0 11.7* 10.3*  HCT 40.8 36.5* 31.9*  PLT 600* 518* 491*   Recent Labs  Lab 08/11/21 0900 08/12/21 0508 08/13/21 0245  NA 135 133* 132*  K 3.4* 3.6 3.7  CL 100 102 100  CO2 25 21* 22  BUN 10 14 15   CREATININE 0.94 1.47* 1.64*  CALCIUM 8.6* 8.1* 7.9*  PROT 7.0  --  5.7*  BILITOT 0.6  --  0.8  ALKPHOS 89  --  89  ALT 19  --  15  AST  21  --  19  GLUCOSE 150* 157* 160*      Imaging/Diagnostic Tests: VAS ABI WITH/WO TBI  Result Date: 08/12/2021  LOWER EXTREMITY DOPPLER STUDY Patient Name:  Roy Taylor  Date of Exam:   08/12/2021 Medical Rec #: [id number]     Accession #:    [id number] Date of Birth: 18-Dec-1965      Patient Gender: M Patient Age:   41 years Exam Location:  [facility] Procedure:      VAS [facility] ABI WITH/WO TBI Referring Phys: [place] --------------------------------------------------------------------------------  Indications: Gangrene. High Risk Factors: Hypertension, hyperlipidemia, Diabetes, no history of                    smoking.  Vascular Interventions: 11-13-2020 Right BKA, 11-07-2020 Abdominal aortogram w/                         LE right. Limitations: Today's exam was limited due to an open wound and edema. Comparison Study: No prior ABI available. Performing Technologist: 11-09-2020 RDMS RVT  Examination Guidelines: A complete evaluation includes at minimum, Doppler waveform signals and systolic blood pressure reading at the level of bilateral brachial, anterior tibial, and posterior tibial arteries, when vessel segments are accessible. Bilateral testing is considered an integral part of a complete examination. Photoelectric Plethysmograph (PPG) waveforms and toe systolic pressure readings are included as required and additional duplex testing as needed. Limited examinations for reoccurring indications may be performed as noted.  ABI Findings: +--------+------------------+-----+---------+--------+  Right    Rt Pressure (mmHg) Index Waveform  Comment   +--------+------------------+-----+---------+--------+  Brachial 146                      triphasic           +--------+------------------+-----+---------+--------+  PTA                                         BKA       +--------+------------------+-----+---------+--------+  DP                                          BKA        +--------+------------------+-----+---------+--------+ +---------+------------------+-----+-----------------------+-------+  Left      Lt Pressure (mmHg) Index Waveform                Comment  +---------+------------------+-----+-----------------------+-------+  Brachial  144                      triphasic                        +---------+------------------+-----+-----------------------+-------+  PTA  Unable to obtain signal          +---------+------------------+-----+-----------------------+-------+  DP        96                 0.66  dampened monophasic              +---------+------------------+-----+-----------------------+-------+  Great Toe                          Unable to obtain signal          +---------+------------------+-----+-----------------------+-------+   Summary: Left: Resting left ankle-brachial index indicates moderate left lower extremity arterial disease. Unable to obtain PTA signal. Uable to obtain toe signal. Arterial calcification may preclude accurate pressures.  *See table(s) above for measurements and observations.  Electronically signed by Heath Lark on 08/12/2021 at 3:32:57 PM.    Final      Evelena Leyden, DO 08/13/2021, 9:31 AM PGY-2, [facility] Family Medicine FPTS Intern pager: [phone], text pages welcome

## 2021-08-13 NOTE — Progress Notes (Signed)
Initial Nutrition Assessment  DOCUMENTATION CODES:   Obesity unspecified  INTERVENTION:  - Encourage PO intake  - 1 packet Juven BID, each packet provides 95 calories, 2.5 grams of protein (collagen), and 9.8 grams of carbohydrate (3 grams sugar); also contains 7 grams of L-arginine and L-glutamine, 300 mg vitamin C, 15 mg vitamin E, 1.2 mcg vitamin B-12, 9.5 mg zinc, 200 mg calcium, and 1.5 g  Calcium Beta-hydroxy-Beta-methylbutyrate to support wound healing  - Ensure Enlive po once daily, each supplement provides 350 kcal and 20 grams of protein  - MVI with minerals daily  - Spoke with pt about Plate Method and ways to balance meals for blood sugar control  - Provided handout of Plate Method in AVS  NUTRITION DIAGNOSIS:   Increased nutrient needs related to post-op healing as evidenced by estimated needs.  GOAL:   Patient will meet greater than or equal to 90% of their needs  MONITOR:   PO intake, Supplement acceptance, Weight trends  REASON FOR ASSESSMENT:   Consult Diet education  ASSESSMENT:   Pt admitted with gangrene and osteomyelitis of L foot. PMH inlcudes PAD, T2DM, R BKA (10/2020), HTN, HLD, OSA.  12/25- L open first toe amputation  Noted plans from vascular surgery MD for LLE angiogram tomorrow as pt is experiencing intermittent tachycardia.  Pt reports having a poor appetite x2 weeks which he attributes to his foot infection. He states that he has only been eating 1 meal a day consisting of an egg sandwich or [place] sandwich. He enjoys foods such as chips and cookies. Prior to the past 2 weeks, he was eating 1-2 meals per day. He checks his blood sugar ~2x a day prior to meals and he also uses insulin at home.   We discussed the importance of balanced blood sugars for proper wound healing. Spoke with pt and his wife about Plate Method as a way to plan his meals/snacks throughout the day. RD provided protein options to enhance meals and discussed the  importance of fiber and choosing whole grains vs refined grains. Unfortunately, could not print Plate Method for pt RD provided in AVS and made pt aware.   Pt with 9% weight loss within the past 6 months. This is not significant for time frame. Suspect pt may have underlying malnutrition, however unable to confirm at this time. Will continue to monitor.  Medications: SSI, IV abx  Labs: sodium 132, Cr 1.64, HgbA1c 9.1%, CBG's 146-169 x 24 hours  NUTRITION - FOCUSED PHYSICAL EXAM: Deferred to follow-up  Diet Order:   Diet Order             Diet Carb Modified Fluid consistency: Thin; Room service appropriate? Yes  Diet effective now                   EDUCATION NEEDS:   Education needs have been addressed  Skin:  Skin Assessment: Skin Integrity Issues: Skin Integrity Issues:: Incisions Incisions: L foot  Last BM:  08/11/21  Height:   Ht Readings from Last 1 Encounters:  08/11/21 6' 3.5" (1.918 m)    Weight:   Wt Readings from Last 1 Encounters:  08/11/21 130.6 kg   IBW: 85.8 kg (adjusted for R BKA)  BMI:  Body mass index is 35.52 kg/m.  Estimated Nutritional Needs:   Kcal:  2300-2500  Protein:  130-145g  Fluid:  >/=2.3L  Drusilla Kanner, RDN, LDN Clinical Nutrition

## 2021-08-13 NOTE — Progress Notes (Signed)
Inpatient Diabetes Program Recommendations  AACE/ADA: New Consensus Statement on Inpatient Glycemic Control (2015)  Target Ranges:  Prepandial:   less than 140 mg/dL      Peak postprandial:   less than 180 mg/dL (1-2 hours)      Critically ill patients:  140 - 180 mg/dL   Lab Results  Component Value Date   GLUCAP 154 (H) 08/13/2021   HGBA1C 9.1 (H) 08/11/2021    Review of Glycemic Control  Latest Reference Range & Units 08/12/21 16:52 08/12/21 22:07 08/13/21 05:57 08/13/21 08:33 08/13/21 11:28  Glucose-Capillary 70 - 99 mg/dL 161 (H) 096 (H) 045 (H) 169 (H) 154 (H)  (H): Data is abnormally high  Diabetes history: DM2 Outpatient Diabetes medications: Levemir 35 units QD, Metformin 1000 mg QD, Farxiga 10 mg QD Current orders for Inpatient glycemic control: Novolog 0-15 units TID and 0-5 units QHS  Spoke with p atient at bedside.  Reviewed patient's current A1c of 9.1% (average blood sugar of 215 mg/dl). Explained what a A1c is and what it measures. Also reviewed goal A1c with patient, importance of good glucose control @ home, and blood sugar goals.  He states his A1C is usually closer to 7%.  Last A1C in Epic in March and was 9.9%.  He confirms above home medications.  Denies difficulties obtaining insulins.  He checks his blood sugar 1-2 x a day.  Denies hypoglycemia.  Current with his PCP.  Ordered LWWD.  He states the dietician spoke with him today.    Discussed long and short term complications of uncontrolled blood sugar.    Will continue to follow while inpatient.  Thank you, Dulce Sellar, RN, BSN Diabetes Coordinator Inpatient Diabetes Program [phone] (team pager from 8a-5p)

## 2021-08-13 NOTE — Evaluation (Signed)
Occupational Therapy Evaluation Patient Details Name: Roy Taylor MRN: [id number] DOB: Nov 11, 1965 Today's Date: 08/13/2021   History of Present Illness 55 y.o. male with diabetic foot infection s/p open left great toe amputation. Plan LLE angiogram 12/27. 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), HTN, DM2, PAD, HLD, morbid obesity, OSA, OA.   Clinical Impression   Session limited by pt wanting to wait until after his procedure to mobilize. Offered to use ant/posterior transfer technique to mobilize to chair however pt declined and states he is not moving out of the bed until after his procedure tomorrow. Discussed with Dr Juanetta Gosling and pt will be WBAT with postop shoe after Angioplasty tomorrow. Anticipate pt will progress and not need OT after DC. Will follow acutey to facilitate safe DC home and update POC as appropriate.      Recommendations for follow up therapy are one component of a multi-disciplinary discharge planning process, led by the attending physician.  Recommendations may be updated based on patient status, additional functional criteria and insurance authorization.   Follow Up Recommendations  Other (comment) (will further assess)    Assistance Recommended at Discharge Intermittent Supervision/Assistance  Functional Status Assessment  Patient has had a recent decline in their functional status and demonstrates the ability to make significant improvements in function in a reasonable and predictable amount of time.  Equipment Recommendations  None recommended by OT    Recommendations for Other Services       Precautions / Restrictions Precautions Precautions: Fall Restrictions Weight Bearing Restrictions: Yes LLE Weight Bearing:  (unsure)      Mobility Bed Mobility Overal bed mobility: Modified Independent             General bed mobility comments: ablet o roll side to side    Transfers                   General transfer comment: pt declined      Balance                                            ADL either performed or assessed with clinical judgement   ADL Overall ADL's : Needs assistance/impaired     Grooming: Set up   Upper Body Bathing: Set up;Bed level   Lower Body Bathing: Set up;Bed level   Upper Body Dressing : Set up   Lower Body Dressing: Minimal assistance;Bed level               Functional mobility during ADLs:  (pt declined)       Vision Baseline Vision/History: 1 Wears glasses       Perception     Praxis      Pertinent Vitals/Pain Pain Assessment: 0-10 Pain Score: 8      Hand Dominance Right   Extremity/Trunk Assessment Upper Extremity Assessment Upper Extremity Assessment: Overall WFL for tasks assessed           Communication     Cognition Arousal/Alertness: Awake/alert Behavior During Therapy: Agitated Overall Cognitive Status: Within Functional Limits for tasks assessed                                       General Comments       Exercises     Shoulder Instructions  Home Living Family/patient expects to be discharged to:: Private residence Living Arrangements: Spouse/significant other Available Help at Discharge: Family;Available PRN/intermittently Type of Home: House Home Access: Level entry     Home Layout: One level;Laundry or work area in basement     [employer] Shower/Tub: Chief Strategy Officer: Standard Bathroom Accessibility: Yes How Accessible: Accessible via walker Home Equipment: Agricultural consultant (2 wheels);Wheelchair - manual;BSC/3in1;Tub bench;Grab bars - tub/shower;Hand held shower head          Prior Functioning/Environment Prior Level of Function : Driving             Mobility Comments: modified indepednent with use of RW; sometimes uses wc; "wahtever"          OT Problem List: Decreased strength;Impaired balance (sitting and/or standing);Decreased safety awareness;Decreased knowledge of  use of DME or AE;Pain      OT Treatment/Interventions: Self-care/ADL training;Therapeutic exercise;DME and/or AE instruction;Therapeutic activities;Patient/family education;Balance training    OT Goals(Current goals can be found in the care plan section) Acute Rehab OT Goals Patient Stated Goal: to have his procedure tomorrow OT Goal Formulation: With patient/family Time For Goal Achievement: 08/27/21 Potential to Achieve Goals: Good  OT Frequency: Min 2X/week   Barriers to D/C:            Co-evaluation              AM-PAC OT "6 Clicks" Daily Activity     Outcome Measure Help from another person eating meals?: None Help from another person taking care of personal grooming?: A Little Help from another person toileting, which includes using toliet, bedpan, or urinal?: A Lot Help from another person bathing (including washing, rinsing, drying)?: A Little Help from another person to put on and taking off regular upper body clothing?: A Little Help from another person to put on and taking off regular lower body clothing?: A Lot 6 Click Score: 17   End of Session Nurse Communication: Other (comment)  Activity Tolerance: Patient limited by pain (does not want ot do anything until after his procedure tomorrow) Patient left: in bed;with call bell/phone within reach;with family/visitor present  OT Visit Diagnosis: Other abnormalities of gait and mobility (R26.89);Pain Pain - Right/Left: Left Pain - part of body:  (foot)                Time: 7262-0355 OT Time Calculation (min): 13 min Charges:  OT General Charges $OT Visit: 1 Visit OT Evaluation $OT Eval Moderate Complexity: 1 Mod  Danelia Snodgrass, OT/L   Acute OT Clinical Specialist Acute Rehabilitation Services Pager [phone] Office [phone]   [facility] 08/13/2021, 12:01 PM

## 2021-08-13 NOTE — Progress Notes (Signed)
Interval Progress Note: Late Entry  S: Followed up on EKG per day team sign out. Discussed with RN who relayed that they were having difficulties having the EKG transfer to the chart. Myself and Dr. Melissa Noon walked up to the floor to review EKG's personally. EKG on 12/25 at 18:36  was sinus tach HR 105 with slight elevation of ST in V1-3. EKG performed 12/26 at 0110 was sinus tach, rate 157 with slight ST elevation in leads V1-4.    O:  Blood pressure (!) 141/66, pulse (!) 102, temperature 99.1 F (37.3 C), temperature source Oral, resp. rate 18, height 6' 3.5" (1.918 m), weight 130.6 kg, SpO2 97 %. Gen: Asleep in bed, no distress  A/P EKG Abnormalities Single troponin was ordered to assess for MI given abnormalities in EKG. Returned at 25 which is reassuring. Patient was comfortable and without complaints of chest pain during night rounds and was asleep comfortably when we went up to assess EKGs.  -Consider repeat EKG -Continue telemetry  -Monitor signs/symptoms clinically

## 2021-08-14 ENCOUNTER — Encounter (HOSPITAL_COMMUNITY)
Admission: EM | Disposition: A | Discharge status: Skilled Nursing Facility | Payer: Self-pay | Source: Home / Self Care | Attending: Family Medicine

## 2021-08-14 ENCOUNTER — Encounter (HOSPITAL_COMMUNITY): Payer: Self-pay | Admitting: Surgery

## 2021-08-14 DIAGNOSIS — E11621 Type 2 diabetes mellitus with foot ulcer: Secondary | ICD-10-CM | POA: Diagnosis not present

## 2021-08-14 DIAGNOSIS — L97529 Non-pressure chronic ulcer of other part of left foot with unspecified severity: Secondary | ICD-10-CM

## 2021-08-14 DIAGNOSIS — I96 Gangrene, not elsewhere classified: Secondary | ICD-10-CM | POA: Diagnosis not present

## 2021-08-14 DIAGNOSIS — I70292 Other atherosclerosis of native arteries of extremities, left leg: Secondary | ICD-10-CM

## 2021-08-14 DIAGNOSIS — L089 Local infection of the skin and subcutaneous tissue, unspecified: Secondary | ICD-10-CM

## 2021-08-14 DIAGNOSIS — I739 Peripheral vascular disease, unspecified: Secondary | ICD-10-CM

## 2021-08-14 DIAGNOSIS — G4733 Obstructive sleep apnea (adult) (pediatric): Secondary | ICD-10-CM

## 2021-08-14 DIAGNOSIS — A419 Sepsis, unspecified organism: Secondary | ICD-10-CM

## 2021-08-14 DIAGNOSIS — L97513 Non-pressure chronic ulcer of other part of right foot with necrosis of muscle: Secondary | ICD-10-CM

## 2021-08-14 DIAGNOSIS — Z89511 Acquired absence of right leg below knee: Secondary | ICD-10-CM

## 2021-08-14 DIAGNOSIS — I70244 Atherosclerosis of native arteries of left leg with ulceration of heel and midfoot: Secondary | ICD-10-CM

## 2021-08-14 HISTORY — PX: ABDOMINAL AORTOGRAM W/LOWER EXTREMITY: CATH118223

## 2021-08-14 HISTORY — PX: PERIPHERAL VASCULAR BALLOON ANGIOPLASTY: CATH118281

## 2021-08-14 LAB — BASIC METABOLIC PANEL
Anion gap: 10 (ref 5–15)
BUN: 17 mg/dL (ref 6–20)
CO2: 19 mmol/L — ABNORMAL LOW (ref 22–32)
Calcium: 8.3 mg/dL — ABNORMAL LOW (ref 8.9–10.3)
Chloride: 106 mmol/L (ref 98–111)
Creatinine, Ser: 1.62 mg/dL — ABNORMAL HIGH (ref 0.61–1.24)
GFR, Estimated: 50 mL/min — ABNORMAL LOW (ref >60)
Glucose, Bld: 169 mg/dL — ABNORMAL HIGH (ref 70–99)
Potassium: 4.2 mmol/L (ref 3.5–5.1)
Sodium: 135 mmol/L (ref 135–145)

## 2021-08-14 LAB — GLUCOSE, CAPILLARY
Glucose-Capillary: 160 mg/dL — ABNORMAL HIGH (ref 70–99)
Glucose-Capillary: 142 mg/dL — ABNORMAL HIGH (ref 70–99)
Glucose-Capillary: 136 mg/dL — ABNORMAL HIGH (ref 70–99)
Glucose-Capillary: 138 mg/dL — ABNORMAL HIGH (ref 70–99)

## 2021-08-14 LAB — CBC
HCT: 33.4 % — ABNORMAL LOW (ref 39.0–52.0)
Hemoglobin: 10.6 g/dL — ABNORMAL LOW (ref 13.0–17.0)
MCH: 26 pg (ref 26.0–34.0)
MCHC: 31.7 g/dL (ref 30.0–36.0)
MCV: 81.9 fL (ref 80.0–100.0)
Platelets: 541 10*3/uL — ABNORMAL HIGH (ref 150–400)
RBC: 4.08 MIL/uL — ABNORMAL LOW (ref 4.22–5.81)
RDW: 12.7 % (ref 11.5–15.5)
WBC: 15.5 10*3/uL — ABNORMAL HIGH (ref 4.0–10.5)
nRBC: 0 % (ref 0.0–0.2)

## 2021-08-14 SURGERY — ABDOMINAL AORTOGRAM W/LOWER EXTREMITY
Anesthesia: LOCAL

## 2021-08-14 MED ORDER — LIDOCAINE HCL (PF) 1 % IJ SOLN
INTRAMUSCULAR | Status: DC | PRN
  Administered 2021-08-14: 18 mL
  Administered 2021-08-14: 4 mL

## 2021-08-14 MED ORDER — MIDAZOLAM HCL 2 MG/2ML IJ SOLN
INTRAMUSCULAR | Status: DC | PRN
  Administered 2021-08-14 (×2): 1 mg via INTRAVENOUS
  Administered 2021-08-14: 2 mg via INTRAVENOUS
  Administered 2021-08-14: 1 mg via INTRAVENOUS

## 2021-08-14 MED ORDER — FENTANYL CITRATE (PF) 100 MCG/2ML IJ SOLN
INTRAMUSCULAR | Status: AC
  Filled 2021-08-14: qty 2

## 2021-08-14 MED ORDER — SODIUM CHLORIDE 0.9 % IV SOLN: 250.0000 mL | INTRAVENOUS | Status: DC | PRN

## 2021-08-14 MED ORDER — POLYETHYLENE GLYCOL 3350 17 G PO PACK
17.0000 g | PACK | Freq: Every day | ORAL | Status: DC | PRN
  Administered 2021-08-15: 21:00:00 17 g via ORAL
  Filled 2021-08-14: qty 1

## 2021-08-14 MED ORDER — SODIUM CHLORIDE 0.9% FLUSH
3.0000 mL | Freq: Two times a day (BID) | INTRAVENOUS | Status: DC
  Administered 2021-08-15 – 2021-08-21 (×11): 3 mL via INTRAVENOUS

## 2021-08-14 MED ORDER — HEPARIN SODIUM (PORCINE) 1000 UNIT/ML IJ SOLN
INTRAMUSCULAR | Status: DC | PRN
  Administered 2021-08-14: 12000 [IU] via INTRAVENOUS

## 2021-08-14 MED ORDER — HEPARIN SODIUM (PORCINE) 1000 UNIT/ML IJ SOLN
INTRAMUSCULAR | Status: AC
  Filled 2021-08-14: qty 20

## 2021-08-14 MED ORDER — MIDAZOLAM HCL 2 MG/2ML IJ SOLN
INTRAMUSCULAR | Status: AC
  Filled 2021-08-14: qty 2

## 2021-08-14 MED ORDER — SODIUM CHLORIDE 0.9 % IV SOLN
3.0000 g | Freq: Four times a day (QID) | INTRAVENOUS | Status: DC
  Administered 2021-08-14 – 2021-08-16 (×8): 3 g via INTRAVENOUS
  Filled 2021-08-14 (×9): qty 8

## 2021-08-14 MED ORDER — HEPARIN (PORCINE) IN NACL 1000-0.9 UT/500ML-% IV SOLN
INTRAVENOUS | Status: AC
  Filled 2021-08-14: qty 1000

## 2021-08-14 MED ORDER — FENTANYL CITRATE (PF) 100 MCG/2ML IJ SOLN
INTRAMUSCULAR | Status: DC | PRN
  Administered 2021-08-14 (×2): 25 ug via INTRAVENOUS
  Administered 2021-08-14 (×2): 50 ug via INTRAVENOUS

## 2021-08-14 MED ORDER — HYDRALAZINE HCL 20 MG/ML IJ SOLN
INTRAMUSCULAR | Status: DC | PRN
  Administered 2021-08-14 (×2): 10 mg via INTRAVENOUS

## 2021-08-14 MED ORDER — HYDRALAZINE HCL 20 MG/ML IJ SOLN
INTRAMUSCULAR | Status: AC
  Filled 2021-08-14: qty 1

## 2021-08-14 MED ORDER — ONDANSETRON HCL 4 MG/2ML IJ SOLN: 4.0000 mg | Freq: Four times a day (QID) | INTRAMUSCULAR | Status: DC | PRN

## 2021-08-14 MED ORDER — SODIUM CHLORIDE 0.9 % IV SOLN: INTRAVENOUS | Status: DC

## 2021-08-14 MED ORDER — LIDOCAINE HCL (PF) 1 % IJ SOLN
INTRAMUSCULAR | Status: AC
  Filled 2021-08-14: qty 30

## 2021-08-14 MED ORDER — SODIUM CHLORIDE 0.9% FLUSH: 3.0000 mL | INTRAVENOUS | Status: DC | PRN

## 2021-08-14 MED ORDER — SODIUM CHLORIDE 0.9 % WEIGHT BASED INFUSION
1.0000 mL/kg/h | INTRAVENOUS | Status: AC
  Administered 2021-08-14: 16:00:00 1 mL/kg/h via INTRAVENOUS

## 2021-08-14 MED ORDER — ACETAMINOPHEN 325 MG PO TABS: 650.0000 mg | ORAL_TABLET | ORAL | Status: DC | PRN

## 2021-08-14 MED ORDER — LABETALOL HCL 5 MG/ML IV SOLN
10.0000 mg | INTRAVENOUS | Status: DC | PRN
  Administered 2021-08-15 (×3): 10 mg via INTRAVENOUS
  Filled 2021-08-14 (×3): qty 4

## 2021-08-14 MED ORDER — MORPHINE SULFATE (PF) 2 MG/ML IV SOLN
2.0000 mg | INTRAVENOUS | Status: DC | PRN
  Administered 2021-08-16 (×2): 2 mg via INTRAVENOUS
  Filled 2021-08-14 (×2): qty 1

## 2021-08-14 MED ORDER — HEPARIN (PORCINE) IN NACL 1000-0.9 UT/500ML-% IV SOLN
INTRAVENOUS | Status: DC | PRN
  Administered 2021-08-14 (×2): 500 mL

## 2021-08-14 MED ORDER — HYDRALAZINE HCL 20 MG/ML IJ SOLN
5.0000 mg | INTRAMUSCULAR | Status: DC | PRN
  Administered 2021-08-15: 07:00:00 5 mg via INTRAVENOUS
  Filled 2021-08-14: qty 1

## 2021-08-14 SURGICAL SUPPLY — 24 items
BALLN STERLING OTW 3X100X150 (BALLOONS) ×3
BALLOON STERLING OTW 3X100X150 (BALLOONS) IMPLANT
CATH INFINITI VERT 5FR 125CM (CATHETERS) ×1 IMPLANT
CATH NAVICROSS ANG 65CM (CATHETERS) IMPLANT
CATH OMNI FLUSH 5F 65CM (CATHETERS) ×1 IMPLANT
CATHETER NAVICROSS ANG 65CM (CATHETERS) ×3
DEVICE TORQUE H2O (MISCELLANEOUS) ×1 IMPLANT
DEVICE VASC CLSR CELT ART 6 (Vascular Products) ×1 IMPLANT
GLIDEWIRE ADV .035X260CM (WIRE) ×1 IMPLANT
GLIDEWIRE NITREX 0.018X80X5 (WIRE) ×3
GUIDEWIRE ANGLED .035X150CM (WIRE) ×1 IMPLANT
GUIDEWIRE NITREX 0.018X80X5 (WIRE) IMPLANT
KIT PV (KITS) ×3 IMPLANT
SHEATH MICROPUNCTURE PEDAL 4FR (SHEATH) ×1 IMPLANT
SHEATH PINNACLE 5F 10CM (SHEATH) ×1 IMPLANT
SHEATH PINNACLE 6F 10CM (SHEATH) ×1 IMPLANT
SHEATH PINNACLE ST 6F 65CM (SHEATH) ×1 IMPLANT
SHEATH PROBE COVER 6X72 (BAG) ×1 IMPLANT
SYR MEDRAD MARK V 150ML (SYRINGE) ×1 IMPLANT
TRANSDUCER W/STOPCOCK (MISCELLANEOUS) ×3 IMPLANT
TRAY PV CATH (CUSTOM PROCEDURE TRAY) ×3 IMPLANT
WIRE BENTSON .035X145CM (WIRE) ×1 IMPLANT
WIRE G V18X300CM (WIRE) ×1 IMPLANT
WIRE MICRO SET SILHO 5FR 7 (SHEATH) ×1 IMPLANT

## 2021-08-14 NOTE — Progress Notes (Signed)
Pt continue to refuse cpap.  Order will be d/c'c at this at three refusals.  RT will monitor.

## 2021-08-14 NOTE — Progress Notes (Signed)
PT Cancellation Note  Patient Details Name: Roy Taylor MRN: [id number] DOB: December 01, 1965   Cancelled Treatment:    Reason Eval/Treat Not Completed: Patient declined, no reason specified (Pt. continues to refuse PT at this time; wants to wait until after angio procedure that is occuring later today.  PT to f/u tomorrow.)  Tierrah Anastos A. Anothy Bufano, PT, DPT Acute Rehabilitation Services Office: [phone]  Dekari Bures A Bodey Frizell 08/14/2021, 9:37 AM

## 2021-08-14 NOTE — Care Management (Signed)
°  Transition of Care [facility]) Screening Note   Patient Details  Name: Roy Taylor Date of Birth: Jun 03, 1966   Transition of Care [facility]) CM/SW Contact:    Lawerance Sabal, RN Phone Number: 08/14/2021, 4:53 PM    Transition of Care Department [facility]) has reviewed patient and no TOC needs have been identified at this time. We will continue to monitor patient advancement through interdisciplinary progression rounds. If new patient transition needs arise, please place a TOC consult.

## 2021-08-14 NOTE — Op Note (Signed)
Patient name: Roy Taylor MRN: [id number] DOB: 11/01/1965 Sex: male  08/11/2021 - 08/14/2021 Pre-operative Diagnosis: Left foot ulcer Post-operative diagnosis:  Same Surgeon:  Durene Cal Procedure Performed:  1.  Ultrasound-guided access, right femoral artery  2.  Abdominal aortogram  3.  Second-order catheterization  4.  Left lower extremity runoff  5.  Ultrasound-guided access, left dorsalis pedis artery  6.  Failed angioplasty left anterior tibial artery  7.  Conscious sedation, 101 minutes   Indications: This is a 55 year old gentleman with history of right below-knee amputation who presented with sepsis from a left great toe infection.  He comes in today for angiographic evaluation  Procedure:  The patient was identified in the holding area and taken to room 8.  The patient was then placed supine on the table and prepped and draped in the usual sterile fashion.  A time out was called.  Conscious sedation was administered with the use of IV fentanyl and Versed under continuous physician and nurse monitoring.  Heart rate, blood pressure, and oxygen saturation were continuously monitored.  Total sedation time was 101 minutes.  Ultrasound was used to evaluate the right common femoral artery.  It was patent .  A digital ultrasound image was acquired.  A micropuncture needle was used to access the right common femoral artery under ultrasound guidance.  An 018 wire was advanced without resistance and a micropuncture sheath was placed.  The 018 wire was removed and a benson wire was placed.  The micropuncture sheath was exchanged for a 5 french sheath.  An omniflush catheter was advanced over the wire to the level of L-1.  An abdominal angiogram was obtained.  Next, using the omniflush catheter and a benson wire, the aortic bifurcation was crossed and the catheter was placed into theleft external iliac artery and left runoff was obtained.    Findings:   Aortogram: No significant renal artery  stenosis.  The infrarenal abdominal aorta is widely patent.  Bilateral common and external iliac arteries are widely patent  Right Lower Extremity: Not evaluated  Left Lower Extremity: Left common femoral and profundofemoral artery are widely patent.  The superficial femoral artery is widely patent.  The popliteal artery is widely patent.  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  There is a stump of the anterior tibial artery which then gives off several collaterals.  There is reconstitution of a small dorsalis pedis at the ankle.  Intervention: After the above images were acquired the decision was made to proceed with intervention.  A 6 French 65 cm sheath was advanced over the bifurcation into the left superficial femoral artery.  The patient was fully heparinized.  Using a V-18 wire with the support of a 3 x 100 Sterling balloon, the anterior tibial artery was cannulated.  The wire went into one of the large collaterals.  I did not feel that antegrade approach was going to be successful because of the numerous collaterals.  Therefore I felt pedal access was the next best option  Ultrasound was used to evaluate the left dorsalis pedis artery.  This was a very small, 1 mm artery.  I used 1% lidocaine for local anesthesia.  Multiple attempts at cannulation were unsuccessful.  Eventually, because of spasm and the small size of the artery, I felt that pedal access would not be successful and so the procedure was terminated.  I closed the groin and the right with a Celt  Impression:  #1  No significant  inflow or outflow disease on the left leg.  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  Diffuse disease out onto the foot  #3  Unsuccessful attempt at recanalization of the anterior tibial artery   V. Durene Cal, M.D., [facility] [place] Office: [phone] Pager:  [phone]

## 2021-08-14 NOTE — Care Management Important Message (Signed)
Important Message  Patient Details  Name: Roy Taylor MRN: [id number] Date of Birth: [birth year]   Medicare Important Message Given:  Yes     Sherilyn Banker 08/14/2021, 1:36 PM

## 2021-08-14 NOTE — Progress Notes (Signed)
Pharmacy Antibiotic Note Roy Taylor is a 55 y.o. male with diabetic foot infection s/p open left great toe amputation. Pharmacy has been consulted for Unasyn dosing.   Plan: Stop vancomycin, cefepime and clindamycin Start Unasyn 3g Q6h F/u cultures, clinical improvement   Height: 6' 3.5" (191.8 cm) Weight: 130.6 kg (288 lb) IBW/kg (Calculated) : 85.65  Temp (24hrs), Avg:99.4 F (37.4 C), Min:99 F (37.2 C), Max:100.2 F (37.9 C)  Recent Labs  Lab 08/11/21 0900 08/11/21 0919 08/12/21 0508 08/12/21 1804 08/13/21 0245 08/14/21 0436  WBC 13.0*  --  13.2*  --  14.5* 15.5*  CREATININE 0.94  --  1.47*  --  1.64* 1.62*  LATICACIDVEN  --  1.2  --   --   --   --   VANCORANDOM  --   --   --  18  --   --     Estimated Creatinine Clearance: 76.5 mL/min (A) (by C-G formula based on SCr of 1.62 mg/dL (H)).    No Known Allergies  Antimicrobials this admission: Zosyn 12/24 x 1 Vancomycin 12/24 > 12/27 Cefepime 12/24 > 12/27 Clindamycin 12/24 > 12/27  Microbiology results: 12/24 Blood: NGTD 12/24 COVID and flu: negative 12/25 Abscess cx: proteus mirabilis (prelim)    Thank you for allowing pharmacy to be a part of this patients care.  Thelma Barge, PharmD Clinical Pharmacist

## 2021-08-14 NOTE — Plan of Care (Signed)

## 2021-08-14 NOTE — H&P (View-Only) (Signed)
[facility] [place] PROGRESS NOTE  ASSESSMENT / PLAN: Roy Taylor is a 55 y.o. male with diabetic foot infection s/p open left great toe amputation. Plan LLE angiogram today with Dr. Myra Gianotti. Start wet-to-dry dressing changes to left foot.   SUBJECTIVE: Feels much better.  OBJECTIVE: BP (!) 154/76 (BP Location: Left Arm)    Pulse 94    Temp 99 F (37.2 C) (Oral)    Resp 18    Ht 6' 3.5" (1.918 m)    Wt 130.6 kg    SpO2 95%    BMI 35.52 kg/m   Intake/Output Summary (Last 24 hours) at 08/14/2021 0832 Last data filed at 08/14/2021 6734 Gross per 24 hour  Intake 2095.84 ml  Output 2275 ml  Net -179.16 ml     No acute distress. RRR Unlabored L foot dressing clean and dry  CBC Latest Ref Rng & Units 08/14/2021 08/13/2021 08/12/2021  WBC 4.0 - 10.5 K/uL 15.5(H) 14.5(H) 13.2(H)  Hemoglobin 13.0 - 17.0 g/dL 10.6(L) 10.3(L) 11.7(L)  Hematocrit 39.0 - 52.0 % 33.4(L) 31.9(L) 36.5(L)  Platelets 150 - 400 K/uL 541(H) 491(H) 518(H)     CMP Latest Ref Rng & Units 08/14/2021 08/13/2021 08/12/2021  Glucose 70 - 99 mg/dL 193(X) 902(I) 097(D)  BUN 6 - 20 mg/dL 17 15 14   Creatinine 0.61 - 1.24 mg/dL ) 5.32(D) 9.24(Q)  Sodium 135 - 145 mmol/L 135 132(L) 133(L)  Potassium 3.5 - 5.1 mmol/L 4.2 3.7 3.6  Chloride 98 - 111 mmol/L 106 100 102  CO2 22 - 32 mmol/L 19(L) 22 21(L)  Calcium 8.9 - 10.3 mg/dL 8.3(L) 7.9(L) 8.1(L)  Total Protein 6.5 - 8.1 g/dL - 5.7(L) -  Total Bilirubin 0.3 - 1.2 mg/dL - 0.8 -  Alkaline Phos 38 - 126 U/L - 89 -  AST 15 - 41 U/L - 19 -  ALT 0 - 44 U/L - 15 -    Estimated Creatinine Clearance: 76.5 mL/min (A) (by C-G formula based on SCr of 1.62 mg/dL (H)).  6.83(M. Rande Brunt, MD [facility] Phone Number: [phone] 08/14/2021 8:32 AM

## 2021-08-14 NOTE — Progress Notes (Signed)
FPTS Interim Night Progress Note  S:Patient sleeping comfortably.  Rounded with primary night RN.  No concerns voiced.  No orders required.    O: Today's Vitals   08/13/21 0900 08/13/21 1207 08/13/21 1306 08/13/21 2116  BP:   (!) 150/80 (!) 160/84  Pulse:   97 (!) 101  Resp:   18   Temp:   99.1 F (37.3 C) 100.2 F (37.9 C)  TempSrc:   Oral Oral  SpO2:   96% 99%  Weight:      Height:      PainSc: 5  5   0-No pain      A/P: Continue current management  Dana Allan MD PGY-3, [facility] Family Medicine Service pager [phone]

## 2021-08-14 NOTE — Anesthesia Postprocedure Evaluation (Signed)
Anesthesia Post Note  Patient: Roy Taylor  Procedure(s) Performed: LEFT OPEN GREAT TOE AMPUTATION (Left: Foot)     Patient location during evaluation: PACU Anesthesia Type: General Level of consciousness: awake and alert Pain management: pain level controlled Vital Signs Assessment: post-procedure vital signs reviewed and stable Respiratory status: spontaneous breathing, nonlabored ventilation, respiratory function stable and patient connected to nasal cannula oxygen Cardiovascular status: blood pressure returned to baseline and stable Postop Assessment: no apparent nausea or vomiting Anesthetic complications: no   No notable events documented.  Last Vitals:  Vitals:   08/13/21 1306 08/13/21 2116  BP: (!) 150/80 (!) 160/84  Pulse: 97 (!) 101  Resp: 18   Temp: 37.3 C 37.9 C  SpO2: 96% 99%    Last Pain:  Vitals:   08/13/21 2116  TempSrc: Oral  PainSc: 0-No pain                 Aariv Medlock S

## 2021-08-14 NOTE — Interval H&P Note (Signed)
History and Physical Interval Note:  08/14/2021 10:57 AM  Roy Taylor  has presented today for surgery, with the diagnosis of PVD.  The various methods of treatment have been discussed with the patient and family. After consideration of risks, benefits and other options for treatment, the patient has consented to  Procedure(s): ABDOMINAL AORTOGRAM W/LOWER EXTREMITY (N/A) as a surgical intervention.  The patient's history has been reviewed, patient examined, no change in status, stable for surgery.  I have reviewed the patient's chart and labs.  Questions were answered to the patient's satisfaction.     Durene Cal

## 2021-08-14 NOTE — Progress Notes (Addendum)
Family Medicine Teaching Service Daily Progress Note Intern Pager: [phone]  Patient name: Roy Taylor Medical record number: [id number] Date of birth: 19-Aug-1966 Age: 55 y.o. Gender: male  Primary Care Provider: Renaldo Harrison, DO Consultants: VVS Code Status: Full  Pt Overview and Major Events to Date:  12/24 - Admitted 12/25 - L open first toe amputation  Assessment and Plan: CASE Roy Taylor is a 55 y.o. 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    Gangrene of the left foot w/severe sepsis   S/p left open first toe amputation Patient is going to have left lower extremity angiogram today.  Overall stable, temperature is mildly elevated overnight to 100.2 F, patient refused medications.  Patient mains intermittently tachycardic but overall stable. ID reached out to pharmacy and Proteus is growing, we will de-escalate antibiotics to Unasyn and will consider D/c if true source control. - VVS following, appreciate recommendations - LLE angiogram today - Transition Vanc, Cefepime, Clindamycin to Unasyn - NS @170mL /hr - Following blood cultures - PT/OT eval and treat - Pain control:             Tylenol 650mg  q6h PRN             Oxycodone IR 5mg  q6h PRN for breakthrough - ASA 81mg  daily   Intermittent tachycardia  Overnight vitals with HR in the 100s, patient close to febrile at that time as well but refused medication. Patient is getting arteriogram with possible surgical procedure today and will be closely monitored.  - Continue telemetry - Monitor for clinical symptoms of ACS - Continue coreg 12.5mg  BID   HTN Blood pressures remain elevated to the 150s/70s.  Patient is getting a procedure today and we will continue to closely monitor, will not initiate other medications at this time. - Continue to monitor closely - continue coreg 12.5mg  BID   AKI Creatinine is  stable at 1.64>1.62, patient is also getting an arteriogram today as well as possible other procedures, anticipate that this may worsen and we will address with further fluids.  - BMP in the AM - NS @ 125mL/hr   T2DM with PAD Last 24 hours, patient received 9 units of NovoLog, blood sugars have remained <180.  Anticipate improvement if there patient receives further interventions regarding his foot today.  We will continue to monitor and consider adding back home medications when able - RD consulted - Diabetic coordinator consulted - CBGs with meals and nightly - Continue moderate SSI  Constipation Patient has not had a bowel movement in at least 2 days.  Abdominal exam is benign. - MiraLAX as needed   HLD - Continue Rosuvastatin 10mg  QHS   OSA Patient was given CPAP, but did not use it overnight.  - CPAP nightly     FEN/GI: NPO until surgery PPx: Heparin Dispo:Home pending clinical improvement . Barriers include continued medical work-up.   Subjective:  Patient reports he is feeling okay today.  He did note that when he turned in bed earlier he was dizzy, this is since resolved.  He does not have any other symptoms at this time.  Reports that he has not had a bowel movement in a couple of days but does not feel the need to go at this moment.  Objective: Temp:  [98 F (36.7 C)-100.2 F (37.9 C)] 100.2 F (37.9 C) (12/26 2116) Pulse Rate:  [88-101]  101 (12/26 2116) Resp:  [18] 18 (12/26 1306) BP: (129-160)/(76-84) 160/84 (12/26 2116) SpO2:  [96 %-99 %] 99 % (12/26 2116) Physical Exam: General: NAD, laying in bed Cardiovascular: RRR, 2+ radial pulses Respiratory: Breathing comfortably on room air, no increased work of breathing, clear to auscultation bilaterally Abdomen: Soft, nontender, nondistended, bowel sounds normoactive Extremities: Right BKA, left first toe amputation wrapped with dressing C/D/I  Laboratory: Recent Labs  Lab 08/12/21 0508 08/13/21 0245  08/14/21 0436  WBC 13.2* 14.5* 15.5*  HGB 11.7* 10.3* 10.6*  HCT 36.5* 31.9* 33.4*  PLT 518* 491* 541*   Recent Labs  Lab 08/11/21 0900 08/12/21 0508 08/13/21 0245 08/14/21 0436  NA 135 133* 132* 135  K 3.4* 3.6 3.7 4.2  CL 100 102 100 106  CO2 25 21* 22 19*  BUN 10 14 15 17   CREATININE 0.94 1.47* 1.64* 1.62*  CALCIUM 8.6* 8.1* 7.9* 8.3*  PROT 7.0  --  5.7*  --   BILITOT 0.6  --  0.8  --   ALKPHOS 89  --  89  --   ALT 19  --  15  --   AST 21  --  19  --   GLUCOSE 150* 157* 160* 169*      Imaging/Diagnostic Tests: No results found.   , DO 08/14/2021, 7:08 AM PGY-2, [facility] FPTS Intern pager: [phone], text pages welcome

## 2021-08-14 NOTE — Progress Notes (Signed)
[facility] [place] PROGRESS NOTE  ASSESSMENT / PLAN: Roy Taylor is a 55 y.o. male with diabetic foot infection s/p open left great toe amputation. Plan LLE angiogram today with Dr. Myra Gianotti. Start wet-to-dry dressing changes to left foot.   SUBJECTIVE: Feels much better.  OBJECTIVE: BP (!) 154/76 (BP Location: Left Arm)    Pulse 94    Temp 99 F (37.2 C) (Oral)    Resp 18    Ht 6' 3.5" (1.918 m)    Wt 130.6 kg    SpO2 95%    BMI 35.52 kg/m   Intake/Output Summary (Last 24 hours) at 08/14/2021 0832 Last data filed at 08/14/2021 6734 Gross per 24 hour  Intake 2095.84 ml  Output 2275 ml  Net -179.16 ml     No acute distress. RRR Unlabored L foot dressing clean and dry  CBC Latest Ref Rng & Units 08/14/2021 08/13/2021 08/12/2021  WBC 4.0 - 10.5 K/uL 15.5(H) 14.5(H) 13.2(H)  Hemoglobin 13.0 - 17.0 g/dL 10.6(L) 10.3(L) 11.7(L)  Hematocrit 39.0 - 52.0 % 33.4(L) 31.9(L) 36.5(L)  Platelets 150 - 400 K/uL 541(H) 491(H) 518(H)     CMP Latest Ref Rng & Units 08/14/2021 08/13/2021 08/12/2021  Glucose 70 - 99 mg/dL 193(X) 902(I) 097(D)  BUN 6 - 20 mg/dL 17 15 14   Creatinine 0.61 - 1.24 mg/dL ) 5.32(D) 9.24(Q)  Sodium 135 - 145 mmol/L 135 132(L) 133(L)  Potassium 3.5 - 5.1 mmol/L 4.2 3.7 3.6  Chloride 98 - 111 mmol/L 106 100 102  CO2 22 - 32 mmol/L 19(L) 22 21(L)  Calcium 8.9 - 10.3 mg/dL 8.3(L) 7.9(L) 8.1(L)  Total Protein 6.5 - 8.1 g/dL - 5.7(L) -  Total Bilirubin 0.3 - 1.2 mg/dL - 0.8 -  Alkaline Phos 38 - 126 U/L - 89 -  AST 15 - 41 U/L - 19 -  ALT 0 - 44 U/L - 15 -    Estimated Creatinine Clearance: 76.5 mL/min (A) (by C-G formula based on SCr of 1.62 mg/dL (H)).  6.83(M. Rande Brunt, MD [facility] Phone Number: [phone] 08/14/2021 8:32 AM

## 2021-08-14 NOTE — Progress Notes (Signed)
Pt refused Tylenol even though temp was 100.2. Explained importance of keeping temp under controled to pt. Will continue to monitor.

## 2021-08-15 ENCOUNTER — Inpatient Hospital Stay (HOSPITAL_COMMUNITY): Payer: Medicare HMO | Admitting: Anesthesiology

## 2021-08-15 ENCOUNTER — Encounter (HOSPITAL_COMMUNITY)
Admission: EM | Disposition: A | Discharge status: Skilled Nursing Facility | Payer: Self-pay | Source: Home / Self Care | Attending: Family Medicine

## 2021-08-15 ENCOUNTER — Encounter (HOSPITAL_COMMUNITY): Payer: Self-pay | Admitting: Family Medicine

## 2021-08-15 DIAGNOSIS — M869 Osteomyelitis, unspecified: Secondary | ICD-10-CM

## 2021-08-15 DIAGNOSIS — I70262 Atherosclerosis of native arteries of extremities with gangrene, left leg: Secondary | ICD-10-CM

## 2021-08-15 DIAGNOSIS — G4733 Obstructive sleep apnea (adult) (pediatric): Secondary | ICD-10-CM | POA: Diagnosis not present

## 2021-08-15 DIAGNOSIS — E1151 Type 2 diabetes mellitus with diabetic peripheral angiopathy without gangrene: Secondary | ICD-10-CM | POA: Diagnosis not present

## 2021-08-15 DIAGNOSIS — I96 Gangrene, not elsewhere classified: Secondary | ICD-10-CM | POA: Diagnosis not present

## 2021-08-15 HISTORY — PX: APPLICATION OF WOUND VAC: SHX5189

## 2021-08-15 HISTORY — PX: AMPUTATION: SHX166

## 2021-08-15 LAB — BASIC METABOLIC PANEL
Anion gap: 9 (ref 5–15)
BUN: 18 mg/dL (ref 6–20)
CO2: 20 mmol/L — ABNORMAL LOW (ref 22–32)
Calcium: 8.2 mg/dL — ABNORMAL LOW (ref 8.9–10.3)
Chloride: 107 mmol/L (ref 98–111)
Creatinine, Ser: 1.28 mg/dL — ABNORMAL HIGH (ref 0.61–1.24)
GFR, Estimated: >60 mL/min (ref >60)
Glucose, Bld: 120 mg/dL — ABNORMAL HIGH (ref 70–99)
Potassium: 3.8 mmol/L (ref 3.5–5.1)
Sodium: 136 mmol/L (ref 135–145)

## 2021-08-15 LAB — CBC
HCT: 31.3 % — ABNORMAL LOW (ref 39.0–52.0)
Hemoglobin: 10.4 g/dL — ABNORMAL LOW (ref 13.0–17.0)
MCH: 26.9 pg (ref 26.0–34.0)
MCHC: 33.2 g/dL (ref 30.0–36.0)
MCV: 80.9 fL (ref 80.0–100.0)
Platelets: 551 10*3/uL — ABNORMAL HIGH (ref 150–400)
RBC: 3.87 MIL/uL — ABNORMAL LOW (ref 4.22–5.81)
RDW: 12.8 % (ref 11.5–15.5)
WBC: 15.1 10*3/uL — ABNORMAL HIGH (ref 4.0–10.5)
nRBC: 0 % (ref 0.0–0.2)

## 2021-08-15 LAB — GLUCOSE, CAPILLARY
Glucose-Capillary: 129 mg/dL — ABNORMAL HIGH (ref 70–99)
Glucose-Capillary: 120 mg/dL — ABNORMAL HIGH (ref 70–99)
Glucose-Capillary: 116 mg/dL — ABNORMAL HIGH (ref 70–99)
Glucose-Capillary: 112 mg/dL — ABNORMAL HIGH (ref 70–99)
Glucose-Capillary: 142 mg/dL — ABNORMAL HIGH (ref 70–99)

## 2021-08-15 SURGERY — AMPUTATION BELOW KNEE
Anesthesia: General | Site: Leg Lower | Laterality: Left

## 2021-08-15 MED ORDER — MIDAZOLAM HCL 2 MG/2ML IJ SOLN
INTRAMUSCULAR | Status: AC
  Filled 2021-08-15: qty 2

## 2021-08-15 MED ORDER — BUPIVACAINE LIPOSOME 1.3 % IJ SUSP
INTRAMUSCULAR | Status: DC | PRN
  Administered 2021-08-15: 3 mL via PERINEURAL
  Administered 2021-08-15: 7 mL via PERINEURAL

## 2021-08-15 MED ORDER — FENTANYL CITRATE (PF) 250 MCG/5ML IJ SOLN
INTRAMUSCULAR | Status: DC | PRN
  Administered 2021-08-15: 75 ug via INTRAVENOUS
  Administered 2021-08-15: 50 ug via INTRAVENOUS
  Administered 2021-08-15: 25 ug via INTRAVENOUS
  Administered 2021-08-15 (×2): 50 ug via INTRAVENOUS

## 2021-08-15 MED ORDER — ORAL CARE MOUTH RINSE: 15.0000 mL | Freq: Once | OROMUCOSAL | Status: AC

## 2021-08-15 MED ORDER — LACTATED RINGERS IV SOLN: INTRAVENOUS | Status: DC

## 2021-08-15 MED ORDER — PROPOFOL 10 MG/ML IV BOLUS
INTRAVENOUS | Status: DC | PRN
  Administered 2021-08-15: 180 mg via INTRAVENOUS

## 2021-08-15 MED ORDER — PHENYLEPHRINE 40 MCG/ML (10ML) SYRINGE FOR IV PUSH (FOR BLOOD PRESSURE SUPPORT)
PREFILLED_SYRINGE | INTRAVENOUS | Status: AC
  Filled 2021-08-15: qty 10

## 2021-08-15 MED ORDER — PHENYLEPHRINE 40 MCG/ML (10ML) SYRINGE FOR IV PUSH (FOR BLOOD PRESSURE SUPPORT)
PREFILLED_SYRINGE | INTRAVENOUS | Status: DC | PRN
  Administered 2021-08-15 (×2): 120 ug via INTRAVENOUS
  Administered 2021-08-15 (×2): 80 ug via INTRAVENOUS

## 2021-08-15 MED ORDER — MIDAZOLAM HCL 2 MG/2ML IJ SOLN: 1.0000 mg | Freq: Once | INTRAMUSCULAR | Status: AC

## 2021-08-15 MED ORDER — FENTANYL CITRATE (PF) 100 MCG/2ML IJ SOLN
INTRAMUSCULAR | Status: AC
  Administered 2021-08-15: 12:00:00 50 ug via INTRAVENOUS
  Filled 2021-08-15: qty 2

## 2021-08-15 MED ORDER — 0.9 % SODIUM CHLORIDE (POUR BTL) OPTIME
TOPICAL | Status: DC | PRN
  Administered 2021-08-15: 12:00:00 1000 mL

## 2021-08-15 MED ORDER — CHLORHEXIDINE GLUCONATE 0.12 % MT SOLN
OROMUCOSAL | Status: AC
  Administered 2021-08-15: 11:00:00 15 mL via OROMUCOSAL
  Filled 2021-08-15: qty 15

## 2021-08-15 MED ORDER — CHLORHEXIDINE GLUCONATE 0.12 % MT SOLN: 15.0000 mL | Freq: Once | OROMUCOSAL | Status: AC

## 2021-08-15 MED ORDER — CEFAZOLIN SODIUM 1 G IJ SOLR
INTRAMUSCULAR | Status: AC
  Filled 2021-08-15: qty 20

## 2021-08-15 MED ORDER — FENTANYL CITRATE (PF) 100 MCG/2ML IJ SOLN: 50.0000 ug | Freq: Once | INTRAMUSCULAR | Status: AC

## 2021-08-15 MED ORDER — MIDAZOLAM HCL 2 MG/2ML IJ SOLN
INTRAMUSCULAR | Status: AC
  Administered 2021-08-15: 12:00:00 1 mg via INTRAVENOUS
  Filled 2021-08-15: qty 2

## 2021-08-15 MED ORDER — MIDAZOLAM HCL 2 MG/2ML IJ SOLN
INTRAMUSCULAR | Status: DC | PRN
  Administered 2021-08-15: 1 mg via INTRAVENOUS

## 2021-08-15 MED ORDER — BUPIVACAINE HCL (PF) 0.5 % IJ SOLN: INTRAMUSCULAR | Status: DC | PRN

## 2021-08-15 MED ORDER — HYDROMORPHONE HCL 1 MG/ML IJ SOLN: 0.2500 mg | INTRAMUSCULAR | Status: DC | PRN

## 2021-08-15 MED ORDER — BUPIVACAINE HCL (PF) 0.5 % IJ SOLN
INTRAMUSCULAR | Status: DC | PRN
  Administered 2021-08-15: 20 mL via PERINEURAL
  Administered 2021-08-15: 10 mL via PERINEURAL

## 2021-08-15 MED ORDER — FENTANYL CITRATE (PF) 250 MCG/5ML IJ SOLN
INTRAMUSCULAR | Status: AC
  Filled 2021-08-15: qty 5

## 2021-08-15 MED ORDER — PROPOFOL 10 MG/ML IV BOLUS
INTRAVENOUS | Status: AC
  Filled 2021-08-15: qty 20

## 2021-08-15 MED ORDER — PHENYLEPHRINE HCL-NACL 20-0.9 MG/250ML-% IV SOLN
INTRAVENOUS | Status: DC | PRN
  Administered 2021-08-15: 75 ug/min via INTRAVENOUS

## 2021-08-15 MED ORDER — ONDANSETRON HCL 4 MG/2ML IJ SOLN
INTRAMUSCULAR | Status: DC | PRN
  Administered 2021-08-15: 4 mg via INTRAVENOUS

## 2021-08-15 SURGICAL SUPPLY — 69 items
BAG COUNTER SPONGE SURGICOUNT (BAG) ×3 IMPLANT
BANDAGE ESMARK 6X9 LF (GAUZE/BANDAGES/DRESSINGS) IMPLANT
BENZOIN TINCTURE PRP APPL 2/3 (GAUZE/BANDAGES/DRESSINGS) ×2 IMPLANT
BLADE LONG MED 31X9 (MISCELLANEOUS) IMPLANT
BLADE SAGITTAL (BLADE)
BLADE SAGITTAL 25.0X1.19X90 (BLADE) IMPLANT
BLADE SAW GIGLI 510 (BLADE) ×2 IMPLANT
BLADE SAW THK.89X75X18XSGTL (BLADE) IMPLANT
BLADE SURG 21 STRL SS (BLADE) ×3 IMPLANT
BNDG COHESIVE 6X5 TAN ST LF (GAUZE/BANDAGES/DRESSINGS) ×1 IMPLANT
BNDG COHESIVE 6X5 TAN STRL LF (GAUZE/BANDAGES/DRESSINGS) ×3 IMPLANT
BNDG ELASTIC 4X5.8 VLCR STR LF (GAUZE/BANDAGES/DRESSINGS) IMPLANT
BNDG ELASTIC 6X5.8 VLCR STR LF (GAUZE/BANDAGES/DRESSINGS) IMPLANT
BNDG ESMARK 6X9 LF (GAUZE/BANDAGES/DRESSINGS) ×3
BNDG GAUZE ELAST 4 BULKY (GAUZE/BANDAGES/DRESSINGS) IMPLANT
CANISTER SUCT 3000ML PPV (MISCELLANEOUS) ×3 IMPLANT
CANISTER WOUND CARE 500ML ATS (WOUND CARE) ×1 IMPLANT
CHLORAPREP W/TINT 26 (MISCELLANEOUS) ×3 IMPLANT
CLIP LIGATING EXTRA MED SLVR (CLIP) ×1 IMPLANT
CLIP LIGATING EXTRA SM BLUE (MISCELLANEOUS) ×1 IMPLANT
COVER SURGICAL LIGHT HANDLE (MISCELLANEOUS) ×3 IMPLANT
CUFF TOURN SGL QUICK 34 (TOURNIQUET CUFF) ×3
CUFF TRNQT CYL 34X4.125X (TOURNIQUET CUFF) IMPLANT
DRAIN CHANNEL 19F RND (DRAIN) IMPLANT
DRAPE DERMATAC (DRAPES) IMPLANT
DRAPE HALF SHEET 40X57 (DRAPES) ×3 IMPLANT
DRAPE INCISE IOBAN 66X45 STRL (DRAPES) ×2 IMPLANT
DRAPE ORTHO SPLIT 77X108 STRL (DRAPES) ×6
DRAPE SURG ORHT 6 SPLT 77X108 (DRAPES) ×4 IMPLANT
DRESSING PREVENA PLUS CUSTOM (GAUZE/BANDAGES/DRESSINGS) IMPLANT
DRSG PREVENA PLUS CUSTOM (GAUZE/BANDAGES/DRESSINGS)
ELECT REM PT RETURN 9FT ADLT (ELECTROSURGICAL) ×3
ELECTRODE REM PT RTRN 9FT ADLT (ELECTROSURGICAL) ×2 IMPLANT
EVACUATOR SILICONE 100CC (DRAIN) IMPLANT
GAUZE SPONGE 4X4 12PLY STRL (GAUZE/BANDAGES/DRESSINGS) ×3 IMPLANT
GAUZE XEROFORM 5X9 LF (GAUZE/BANDAGES/DRESSINGS) ×3 IMPLANT
GLOVE SURG POLYISO LF SZ8 (GLOVE) ×3 IMPLANT
GLOVE SURG UNDER POLY LF SZ6 (GLOVE) ×1 IMPLANT
GOWN STRL REUS W/ TWL LRG LVL3 (GOWN DISPOSABLE) ×4 IMPLANT
GOWN STRL REUS W/ TWL XL LVL3 (GOWN DISPOSABLE) ×2 IMPLANT
GOWN STRL REUS W/TWL LRG LVL3 (GOWN DISPOSABLE) ×6
GOWN STRL REUS W/TWL XL LVL3 (GOWN DISPOSABLE) ×3
KIT BASIN OR (CUSTOM PROCEDURE TRAY) ×3 IMPLANT
KIT TURNOVER KIT B (KITS) ×3 IMPLANT
NS IRRIG 1000ML POUR BTL (IV SOLUTION) ×3 IMPLANT
PACK GENERAL/GYN (CUSTOM PROCEDURE TRAY) ×3 IMPLANT
PAD ARMBOARD 7.5X6 YLW CONV (MISCELLANEOUS) ×6 IMPLANT
PENCIL SMOKE EVACUATOR (MISCELLANEOUS) ×3 IMPLANT
PREVENA RESTOR ARTHOFORM 46X30 (CANNISTER) ×1 IMPLANT
SPONGE T-LAP 18X18 ~~LOC~~+RFID (SPONGE) ×2 IMPLANT
STAPLER SKIN 35 REG (STAPLE) ×3 IMPLANT
STAPLER VISISTAT 35W (STAPLE) ×4 IMPLANT
STOCKINETTE IMPERVIOUS LG (DRAPES) ×4 IMPLANT
SUT BONE WAX W31G (SUTURE) IMPLANT
SUT ETHIBOND NAB CTX #1 30IN (SUTURE) ×1 IMPLANT
SUT ETHILON 2 0 PSLX (SUTURE) ×6 IMPLANT
SUT ETHILON 3 0 PS 1 (SUTURE) IMPLANT
SUT SILK 0 TIES 10X30 (SUTURE) ×3 IMPLANT
SUT SILK 2 0 (SUTURE) ×3
SUT SILK 2 0 SH CR/8 (SUTURE) ×4 IMPLANT
SUT SILK 2-0 18XBRD TIE 12 (SUTURE) ×2 IMPLANT
SUT SILK 3 0 (SUTURE)
SUT SILK 3-0 18XBRD TIE 12 (SUTURE) IMPLANT
SUT VIC AB 0 CT1 18XCR BRD 8 (SUTURE) ×4 IMPLANT
SUT VIC AB 0 CT1 8-18 (SUTURE) ×6
TAPE UMBILICAL 1/8X18 (MISCELLANEOUS) ×1 IMPLANT
TOWEL GREEN STERILE (TOWEL DISPOSABLE) ×6 IMPLANT
UNDERPAD 30X36 HEAVY ABSORB (UNDERPADS AND DIAPERS) ×3 IMPLANT
WATER STERILE IRR 1000ML POUR (IV SOLUTION) ×3 IMPLANT

## 2021-08-15 NOTE — Anesthesia Preprocedure Evaluation (Signed)
Anesthesia Evaluation  Patient identified by MRN, date of birth, ID band Patient awake    Reviewed: Allergy & Precautions, H&P , NPO status , Patient's Chart, lab work & pertinent test results  Airway Mallampati: II   Neck ROM: full    Dental no notable dental hx. (+) Dental Advisory Given   Pulmonary sleep apnea ,    breath sounds clear to auscultation       Cardiovascular hypertension, Pt. on medications + Peripheral Vascular Disease   Rhythm:regular Rate:Normal     Neuro/Psych  Neuromuscular disease    GI/Hepatic   Endo/Other  diabetes, Type 2  Renal/GU      Musculoskeletal  (+) Arthritis ,   Abdominal   Peds  Hematology   Anesthesia Other Findings   Reproductive/Obstetrics                             Anesthesia Physical  Anesthesia Plan  ASA: 3 and emergent  Anesthesia Plan: General   Post-op Pain Management: Regional block and Tylenol PO (pre-op)   Induction: Intravenous  PONV Risk Score and Plan: 2 and Ondansetron, Dexamethasone, Midazolam and Treatment may vary due to age or medical condition  Airway Management Planned: LMA  Additional Equipment:   Intra-op Plan:   Post-operative Plan: Extubation in OR  Informed Consent: I have reviewed the patients History and Physical, chart, labs and discussed the procedure including the risks, benefits and alternatives for the proposed anesthesia with the patient or authorized representative who has indicated his/her understanding and acceptance.     Dental advisory given  Plan Discussed with: CRNA, Anesthesiologist and Surgeon  Anesthesia Plan Comments:         Anesthesia Quick Evaluation

## 2021-08-15 NOTE — Anesthesia Procedure Notes (Signed)
Procedure Name: LMA Insertion Date/Time: 08/15/2021 12:33 PM Performed by: Drema Pry, CRNA Pre-anesthesia Checklist: Patient identified, Emergency Drugs available, Suction available and Patient being monitored Patient Re-evaluated:Patient Re-evaluated prior to induction Oxygen Delivery Method: Circle System Utilized Preoxygenation: Pre-oxygenation with 100% oxygen Induction Type: IV induction Ventilation: Mask ventilation without difficulty LMA: LMA inserted LMA Size: 5.0 Number of attempts: 1 Placement Confirmation: positive ETCO2 Tube secured with: Tape Dental Injury: Teeth and Oropharynx as per pre-operative assessment

## 2021-08-15 NOTE — Progress Notes (Signed)
Family Medicine Teaching Service Daily Progress Note Intern Pager: [phone]  Patient name: Roy Taylor Medical record number: [id number] Date of birth: Jan 09, 1966 Age: 55 y.o. Gender: male  Primary Care Provider: Renaldo Harrison, DO Consultants: VVS Code Status: Full  Pt Overview and Major Events to Date:  12/24 - Admitted 12/25 - L open first toe amputation 12/27 abdominal aortogram with lower extremity completed unsuccessful attempt at recanalization of the anterior tibial artery  Assessment and Plan: Roy Taylor is a 55 y.o. 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    Gangrene of the left foot w/severe sepsis   S/p left open first toe amputation Patient received arteriogram on 12/27.  There was unsuccessful attempt at recanalization anterior tibial artery.  Remained afebrile overnight.  Intermittent tachycardia.  ID was consulted on 1227 after Proteus is growing and ID escalated antibiotics to Unasyn.  Plan for BKA today with Dr. Blase Mess.  N.p.o. at this time. - CVTS following, appreciate their assistance - BKA procedure today per vascular surgery - Continue Unasyn - Continue maintenance fluids - Continue to follow blood cultures - PT/OT eval and treat - Pain control at this time Tylenol 650 mg every 6 as needed and oxycodone IR 5 mg every 6 as needed for breakthrough pain - Holding aspirin at this time - Heparin every 8 hours to be held for prior to surgery per vascular's recommendations  Intermittent tachycardia Pulse rates overnight ranging from 92/110.  Afebrile throughout the night. - Continue telemetry monitoring - Monitor for signs or symptoms of ACS - Continue Coreg 12.5 mg twice daily    HTN Blood pressures over the last 24 hours ranging from 130/80-200/115.  Home medications include Coreg 12.5 mg twice daily. - Continue monitor BPs - Continue Coreg  12.5 twice daily   AKI Creatinine improved today to 1.26 from 1.62. - Continue to monitor with daily BMPs - Avoid nephrotoxic agents   T2DM with PAD Blood sugars have been stable over the last 24 hours in the mid 100s.  Patient received 5 units of short acting insulin on 12/27. - RD has been consulted - Dietary coordinator consulted - Continue CBGs with meals and nightly - Continue moderate SSI  Constipation Patient yet to have a bowel movement. - MiraLAX as needed   HLD - Continue Rosuvastatin 10mg  QHS   OSA Patient was given CPAP, but did not use it overnight.  - CPAP nightly     FEN/GI: NPO until surgery PPx: Heparin Dispo:Home pending clinical improvement . Barriers include continued medical work-up.   Subjective:  Patient is in good spirits this morning.  Denies any pain.  Reports that he is ready to get the surgery over with.  Objective: Temp:  [98.2 F (36.8 C)-98.7 F (37.1 C)] 98.4 F (36.9 C) (12/28 0725) Pulse Rate:  [92-110] 96 (12/28 0605) Resp:  [10-70] 18 (12/28 0605) BP: (150-200)/(89-118) 164/92 (12/28 0725) SpO2:  [96 %-100 %] 98 % (12/27 1349) Physical Exam: General: Sitting comfortably in bed, no acute distress Cardiovascular: Regular rate and rhythm, no murmurs appreciated Respiratory: Normal work of breathing, lungs clear to auscultation  Abdomen: Soft, nontender, positive bowel sound Extremities: Patient with right BKA, left lower extremity wrapped in dressing  Laboratory: Recent Labs  Lab 08/13/21 0245 08/14/21 0436 08/15/21 0338  WBC 14.5* 15.5* 15.1*  HGB 10.3* 10.6* 10.4*  HCT 31.9* 33.4* 31.3*  PLT 491* 541* 551*    Recent Labs  Lab 08/11/21 0900 08/12/21 0508 08/13/21 0245 08/14/21 0436 08/15/21 0338  NA 135   < > 132* 135 136  K 3.4*   < > 3.7 4.2 3.8  CL 100   < > 100 106 107  CO2 25   < > 22 19* 20*  BUN 10   < > 15 17 18   CREATININE 0.94   < > 1.64* 1.62* 1.28*  CALCIUM 8.6*   < > 7.9* 8.3* 8.2*  PROT 7.0  --   5.7*  --   --   BILITOT 0.6  --  0.8  --   --   ALKPHOS 89  --  89  --   --   ALT 19  --  15  --   --   AST 21  --  19  --   --   GLUCOSE 150*   < > 160* 169* 120*   < > = values in this interval not displayed.       Imaging/Diagnostic Tests: PERIPHERAL VASCULAR CATHETERIZATION  Result Date: 08/14/2021 Images from the original result were not included. Patient name: Roy Taylor MRN: Gypsy Balsam DOB: 08-Jan-1966 Sex: male 08/11/2021 - 08/14/2021 Pre-operative Diagnosis: Left foot ulcer Post-operative diagnosis:  Same Surgeon:  08/16/2021 Procedure Performed:  1.  Ultrasound-guided access, right femoral artery  2.  Abdominal aortogram  3.  Second-order catheterization  4.  Left lower extremity runoff  5.  Ultrasound-guided access, left dorsalis pedis artery  6.  Failed angioplasty left anterior tibial artery  7.  Conscious sedation, 101 minutes Indications: This is a 55 year old gentleman with history of right below-knee amputation who presented with sepsis from a left great toe infection.  He comes in today for angiographic evaluation Procedure:  The patient was identified in the holding area and taken to room 8.  The patient was then placed supine on the table and prepped and draped in the usual sterile fashion.  A time out was called.  Conscious sedation was administered with the use of IV fentanyl and Versed under continuous physician and nurse monitoring.  Heart rate, blood pressure, and oxygen saturation were continuously monitored.  Total sedation time was 101 minutes.  Ultrasound was used to evaluate the right common femoral artery.  It was patent .  A digital ultrasound image was acquired.  A micropuncture needle was used to access the right common femoral artery under ultrasound guidance.  An 018 wire was advanced without resistance and a micropuncture sheath was placed.  The 018 wire was removed and a benson wire was placed.  The micropuncture sheath was exchanged for a 5 french sheath.  An  omniflush catheter was advanced over the wire to the level of L-1.  An abdominal angiogram was obtained.  Next, using the omniflush catheter and a benson wire, the aortic bifurcation was crossed and the catheter was placed into theleft external iliac artery and left runoff was obtained.  Findings:  Aortogram: No significant renal artery stenosis.  The infrarenal abdominal aorta is widely patent.  Bilateral common and external iliac arteries are widely patent  Right Lower Extremity: Not evaluated  Left Lower Extremity: Left common femoral and profundofemoral artery are widely patent.  The superficial femoral artery is widely patent.  The popliteal artery is widely patent.  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  There is a stump of the anterior tibial artery which then gives off several collaterals.  There is reconstitution of a small dorsalis pedis at the ankle. Intervention: After the above images were acquired the decision was made to proceed with intervention.  A 6 French 65 cm sheath was advanced over the bifurcation into the left superficial femoral artery.  The patient was fully heparinized.  Using a V-18 wire with the support of a 3 x 100 Sterling balloon, the anterior tibial artery was cannulated.  The wire went into one of the large collaterals.  I did not feel that antegrade approach was going to be successful because of the numerous collaterals.  Therefore I felt pedal access was the next best option Ultrasound was used to evaluate the left dorsalis pedis artery.  This was a very small, 1 mm artery.  I used 1% lidocaine for local anesthesia.  Multiple attempts at cannulation were unsuccessful.  Eventually, because of spasm and the small size of the artery, I felt that pedal access would not be successful and so the procedure was terminated.  I closed the groin and the right with a Celt Impression:  #1  No significant inflow or outflow disease on the left leg.  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  Diffuse disease out onto the foot  #3  Unsuccessful attempt at recanalization of the anterior tibial artery V. Durene Cal, M.D., [facility] [place] Office: [phone] Pager:  [phone]     Derrel Nip, MD 08/15/2021, 8:16 AM PGY-3, [facility] FPTS Intern pager: [phone], text pages welcome

## 2021-08-15 NOTE — Op Note (Signed)
DATE OF SERVICE: 08/15/2021  PATIENT:  Roy Taylor  55 y.o. 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  No options for vascular reconstruction  POST-OPERATIVE DIAGNOSIS:  Same  PROCEDURE:   Left below-knee amputation with myodesis  SURGEON:  Surgeon(s) and Role:    * Leonie Douglas, MD - Primary  ASSISTANT: Natasha Bence, RNFA  An assistant was required to facilitate exposure and expedite the case.  ANESTHESIA:   general  EBL: 100 mL  BLOOD ADMINISTERED:none  DRAINS: none   LOCAL MEDICATIONS USED:  NONE  SPECIMEN: Residual left lower extremity for gross evaluation only  COUNTS: confirmed correct .  TOURNIQUET:    Total Tourniquet Time Documented: Thigh (Left) - 13 minutes Total: Thigh (Left) - 13 minutes  PATIENT DISPOSITION:  PACU - hemodynamically stable.   Delay start of Pharmacological VTE agent (>24hrs) due to surgical blood loss or risk of bleeding: no  INDICATION FOR PROCEDURE: Roy Taylor is a 55 y.o. 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  Angiogram revealed no options for vascular reconstruction. After careful discussion of risks, benefits, and alternatives the patient was offered left below-knee amputation. The patient  understood and wished to proceed.  OPERATIVE FINDINGS: Unremarkable left below-knee amputation.  Healthy tissue at amputation margin.  Myodesis performed with gastrocnemius aponeurosis.  DESCRIPTION OF PROCEDURE: After identification of the patient in the pre-operative holding area, the patient was transferred to the operating room. The patient was positioned supine on the operating room table. Anesthesia was induced. The left leg was prepped and draped in standard fashion. A surgical pause was performed confirming correct patient, procedure, and operative location.  A tourniquet  was placed on the left thigh. The skin of the leg was marked to plan the anterior flap 10 cm distal to the tibial tuberosity. The anterior flap measured two thirds the circumference of the calf. The posterior flap was measured to be one third of the circumference of the calf in length.   The leg was exsanguinated with an Esmarch tourniquet. The pneumatic tourniquet was inflated to 250 mm Hg. The flaps were created using pre-made marks using a 21 blade. The incision was carried down through subcutaneous tissue, fascia, and muscle anteriorly. The periosteal tissue was elevated anteriorly so that the tibia was about 3 cm shorter than the anterior skin flap.  The tibia was transected with a power saw. An anterior wedge was created with the power saw.  Then I smoothed out the rough edges with a rasp.  In a similar fashion, I cut back the fibula about two centimeters higher than the level of the tibia with an angled bone cutter.  The posterior flap was completed with an amputation knife.  The specimen was passed off the field. All visible arteries and veins were clamped and ligated using silk suture.  The tourniquet was deflated.  Hemostasis was achieved in the flaps using electrocautery and silk suture.  The stump was washed with sterile normal saline and no further active bleeding was noted.    A myodesis was created using a powered drill to create a small hole through the anterior cortex of the tibia.  A #5 FiberWire was delivered through the osteotomy and through the aponeurosis of the gastrocnemius.  I reapproximated the anterior and posterior fascia  with interrupted stitches of 2-0 Vicryl.  This was completed along the entire length of anterior and  posterior fascia until there was no more loose space in the fascial line.  The skin was then reapproximated with staples.  The stump was washed off and dried.  A Prevena VAC system was applied to the stump.  This was secured with derma tack and Ioban.  A good seal  was achieved.  A stump shrinker was applied.  A stump protector was applied.  Upon completion of the case instrument and sharps counts were confirmed correct. The patient was transferred to the PACU in good condition. I was present for all portions of the procedure.  Rande Brunt. Lenell Antu, MD [facility] Phone Number: [phone] 08/15/2021 5:21 PM

## 2021-08-15 NOTE — Progress Notes (Signed)
°  Inpatient Rehab Admissions Coordinator :  Per therapy recommendations patient was screened for CIR candidacy by Ottie Glazier RN MSN. [employer] will not approve CIR for an amputation. Patient previously admitted to [facility] after first BKA. Other rehab venues need to be pursued. Please contact me with any questions. I will not place a rehab consult.  Ottie Glazier RN MSN Admissions Coordinator [phone]

## 2021-08-15 NOTE — Plan of Care (Signed)

## 2021-08-15 NOTE — Transfer of Care (Signed)
Immediate Anesthesia Transfer of Care Note  Patient: Roy Taylor  Procedure(s) Performed: LEFT AMPUTATION BELOW KNEE (Left: Knee)  Patient Location: PACU  Anesthesia Type:GA combined with regional for post-op pain  Level of Consciousness: drowsy, patient cooperative and responds to stimulation  Airway & Oxygen Therapy: Patient Spontanous Breathing  Post-op Assessment: Report given to RN and Post -op Vital signs reviewed and stable  Post vital signs: Reviewed and stable  Last Vitals:  Vitals Value Taken Time  BP 148/74 08/15/21 1344  Temp    Pulse 89 08/15/21 1348  Resp 27 08/15/21 1348  SpO2 95 % 08/15/21 1348  Vitals shown include unvalidated device data.  Last Pain:  Vitals:   08/15/21 1117  TempSrc:   PainSc: 0-No pain      Patients Stated Pain Goal: 0 (08/13/21 0542)  Complications: No notable events documented.

## 2021-08-15 NOTE — Progress Notes (Signed)
PT Cancellation Note  Patient Details Name: Roy Taylor MRN: [id number] DOB: 01/16/1966   Cancelled Treatment:    Reason Eval/Treat Not Completed: Patient declined, no reason specified;Other (comment). Plan for left BKA today, pt requesting hold off on PT until after surgery. Will plan to follow-up later after surgery as time permits.   Raymond Gurney, PT, DPT Acute Rehabilitation Services  Pager: [phone] Office: [phone]    Roy Taylor 08/15/2021, 8:29 AM

## 2021-08-15 NOTE — Progress Notes (Signed)
FPTS Brief Progress Note  S: Pt resting. Has no concerns    O: BP (!) 177/92 (BP Location: Right Arm)    Pulse 95    Temp 98.4 F (36.9 C) (Oral)    Resp 18    Ht 6' 3.5" (1.918 m)    Wt 130.6 kg    SpO2 98%    BMI 35.52 kg/m    GEN: rolling his thumb around pulse-ox cord, in no distress    A/P: No changes to plan.  - Orders reviewed. Labs for AM ordered, which was adjusted as needed.    Katha Cabal, DO 08/15/2021, 1:47 AM PGY-3, [facility] Night Resident  Please page [phone] with questions.

## 2021-08-15 NOTE — Progress Notes (Signed)
Dr. Krista Blue notified

## 2021-08-15 NOTE — Evaluation (Signed)
Physical Therapy Evaluation Patient Details Name: Roy Taylor MRN: [id number] DOB: 07-Aug-1966 Today's Date: 08/15/2021  History of Present Illness  Pt is a 55 y.o. male admitted 08/11/21 with L foot pain; concern for L great toe gangrene. L foot MRI (+) for cellulitis and OM. Pt became septic and urgent L 1st met amputation was completed on 12/25. S/p angiography 12/27 which demonstrated occlusion of all 3 tibial vessels of LLE with no option for revascularization. 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 PTA, he was mod I, ambulating intermittent with a RW and intermittently without an AD when his R prosthesis was donned. Pt lives with his wife and adult children in a house with a level entry. Currently, pt displays deficits in lower extremity strength, balance, power, and activity tolerance. He was able to perform bed mobility with mod I but was unable to stand using his R prosthesis despite multiple attempts and approaches, even with maxA. Pt is very motivated to get a L leg prosthesis and walk again. He is also motivated to return home. Pt would greatly benefit from intensive therapy in the inpatient rehab setting as he has had a significant change in functional status and a new BKA. If he does not qualify for inpatient rehab, he may benefit from a short-term SNF rehab stay. If pt is able to progress to functioning at a level his family can manage, like transferring with minA, then he may be able to progress to going home with follow-up PT in the Outpatient or [place] setting. Will continue to follow acutely.        Recommendations for follow up therapy are one component of a multi-disciplinary discharge planning process, led by the attending physician.  Recommendations may be updated based on patient status, additional functional criteria and  insurance authorization.  Follow Up Recommendations Acute inpatient rehab (3hours/day)    Assistance Recommended at Discharge Intermittent Supervision/Assistance  Functional Status Assessment Patient has had a recent decline in their functional status and demonstrates the ability to make significant improvements in function in a reasonable and predictable amount of time.  Equipment Recommendations  None recommended by PT    Recommendations for Other Services Rehab consult     Precautions / Restrictions Precautions Precautions: Fall Precaution Comments: L BKA 12/28, prior R BKA (prosthesis in room) Restrictions Weight Bearing Restrictions: Yes LLE Weight Bearing: Non weight bearing Other Position/Activity Restrictions: limb protector      Mobility  Bed Mobility Overal bed mobility: Modified Independent             General bed mobility comments: Pt able to transition supine <> sit with HOB elevated without assistance.    Transfers Overall transfer level: Needs assistance Equipment used: Rolling walker (2 wheels);1 person hand held assist;Ambulation equipment used Transfers: Sit to/from Stand Sit to Stand: Max assist;From elevated surface           General transfer comment: Attempted sit to stand using R prosthesis from elevated EOB multiple times to RW without success, thus attempted to PT with anterior approach and asisstance to extend hips without success multiple times, thus attempted to stand with use of stedy but also unsuccessful despite multiple attempts and maxA.    Ambulation/Gait               General Gait Details: Unable  Stairs            Wheelchair Mobility    Modified Rankin (Stroke Patients Only)       Balance Overall balance assessment: Needs assistance Sitting-balance support: No upper extremity supported;Feet supported Sitting balance-Leahy Scale: Good Sitting balance - Comments: Able to reach off BOS to donn and doff R  prosthesis sitting EOB without LOB or assistance.   Standing balance support: Bilateral upper extremity supported Standing balance-Leahy Scale: Zero Standing balance comment: Unable to stand on R prosthesis with bil UE support and maxA.                             Pertinent Vitals/Pain Pain Assessment: No/denies pain    Home Living Family/patient expects to be discharged to:: Private residence Living Arrangements: Spouse/significant other;Children Available Help at Discharge: Family;Available 24 hours/day Type of Home: House Home Access: Level entry       Home Layout: Two level;Able to live on main level with bedroom/bathroom Home Equipment: Rolling Walker (2 wheels);Wheelchair - manual;BSC/3in1;Tub bench;Grab bars - tub/shower;Hand held Chief Technology Officer (4 wheels)      Prior Function Prior Level of Function : Independent/Modified Independent;Driving             Mobility Comments: Pt reports being mod I using RW, intermittently no AD, to ambulate with use of R prosthesis. Intermittently uses w/c.       Hand Dominance   Dominant Hand: Right    Extremity/Trunk Assessment   Upper Extremity Assessment Upper Extremity Assessment: Defer to OT evaluation    Lower Extremity Assessment Lower Extremity Assessment: RLE deficits/detail;LLE deficits/detail RLE Deficits / Details: prior BKA, gross weakness noted functionally with inability to extend hip and knee with SLS against gravity and body weight LLE Deficits / Details: S/p BKA 12/28 with gross weakness, but able to lift leg into hip flexion with knee extended against gravity    Cervical / Trunk Assessment Cervical / Trunk Assessment: Normal  Communication   Communication: No difficulties  Cognition Arousal/Alertness: Awake/alert Behavior During Therapy: Flat affect Overall Cognitive Status: Within Functional Limits for tasks assessed                                 General Comments: Pt  follows commands appropriately. Pt adament that he could transfer safely but then unable to come to stand, was unrealistic of his capabilities now with bil BKA. Likely WFL though.        General Comments General comments (skin integrity, edema, etc.): Educated pt on proper positioning and beginning hip and knee AROM exercises for L BKA.    Exercises     Assessment/Plan    PT Assessment Patient needs continued PT services  PT Problem List Decreased strength;Decreased activity tolerance;Decreased range of motion;Decreased balance;Decreased mobility;Decreased skin integrity       PT Treatment Interventions DME instruction;Gait training;Functional mobility training;Therapeutic activities;Therapeutic exercise;Balance training;Neuromuscular re-education;Patient/family education;Wheelchair mobility training    PT Goals (Current goals can be found in the Care Plan section)  Acute Rehab PT Goals Patient Stated Goal: to go home PT Goal Formulation: With patient Time For Goal Achievement: 08/29/21 Potential to Achieve Goals: Good    Frequency Min 3X/week   Barriers to discharge        Co-evaluation               AM-PAC PT "6 Clicks" Mobility  Outcome  Measure Help needed turning from your back to your side while in a flat bed without using bedrails?: None Help needed moving from lying on your back to sitting on the side of a flat bed without using bedrails?: None Help needed moving to and from a bed to a chair (including a wheelchair)?: Total Help needed standing up from a chair using your arms (e.g., wheelchair or bedside chair)?: Total Help needed to walk in hospital room?: Total Help needed climbing 3-5 steps with a railing? : Total 6 Click Score: 12    End of Session Equipment Utilized During Treatment: Gait belt Activity Tolerance: Patient tolerated treatment well Patient left: in bed;with call bell/phone within reach;with bed alarm set;with family/visitor present   PT  Visit Diagnosis: Unsteadiness on feet (R26.81);Muscle weakness (generalized) (M62.81);Difficulty in walking, not elsewhere classified (R26.2)    Time: 3557-3220 PT Time Calculation (min) (ACUTE ONLY): 34 min   Charges:   PT Evaluation $PT Eval Moderate Complexity: 1 Mod PT Treatments $Therapeutic Activity: 8-22 mins        Moishe Spice, PT, DPT Acute Rehabilitation Services  Pager: [phone] Office: [phone]   Orvan Falconer 08/15/2021, 5:02 PM

## 2021-08-15 NOTE — Progress Notes (Addendum)
°  Progress Note    08/15/2021 8:17 AM 1 Day Post-Op  Subjective:  No new complaints   Vitals:   08/15/21 0655 08/15/21 0725  BP: (!) 192/91 (!) 164/92  Pulse:    Resp:    Temp:  98.4 F (36.9 C)  SpO2:     Physical Exam: R groin cath site without hematoma  CBC    Component Value Date/Time   WBC 15.1 (H) 08/15/2021 0338   RBC 3.87 (L) 08/15/2021 0338   HGB 10.4 (L) 08/15/2021 0338   HCT 31.3 (L) 08/15/2021 0338   PLT 551 (H) 08/15/2021 0338   MCV 80.9 08/15/2021 0338   MCH 26.9 08/15/2021 0338   MCHC 33.2 08/15/2021 0338   RDW 12.8 08/15/2021 0338   LYMPHSABS 2.0 08/11/2021 0900   MONOABS 0.7 08/11/2021 0900   EOSABS 0.0 08/11/2021 0900   BASOSABS 0.1 08/11/2021 0900    BMET    Component Value Date/Time   NA 136 08/15/2021 0338   K 3.8 08/15/2021 0338   CL 107 08/15/2021 0338   CO2 20 (L) 08/15/2021 0338   GLUCOSE 120 (H) 08/15/2021 0338   BUN 18 08/15/2021 0338   CREATININE 1.28 (H) 08/15/2021 0338   CALCIUM 8.2 (L) 08/15/2021 0338   GFRNONAA >60 08/15/2021 0338    INR    Component Value Date/Time   INR 1.2 11/13/2020 0920     Intake/Output Summary (Last 24 hours) at 08/15/2021 0817 Last data filed at 08/15/2021 0600 Gross per 24 hour  Intake 2265.94 ml  Output 1650 ml  Net 615.94 ml     Assessment/Plan:  55 y.o. male is s/p L GT open amp; angiography 1 Day Post-Op   Angiography demonstrated occlusion of all 3 tibial vessels of LLE with no option for revascularization.  He will be unable to heal his toe wound given the above findings.  Plan is for left BKA today with Dr. Lenell Antu.  NPO today.  Consent ordered   Emilie Rutter, PA-C [facility] [phone] 08/15/2021 8:17 AM  VASCULAR STAFF ADDENDUM: I have independently interviewed and examined the patient. I agree with the above.  For left below knee amputation today.  Rande Brunt. Lenell Antu, MD [facility] Phone Number: [phone] 08/15/2021 12:05 PM

## 2021-08-15 NOTE — Progress Notes (Signed)
FPTS Brief Progress Note  S: Pt denies pain after his procedure. He has no concerns. RN reports pt at dinner without issue this evening.  He was talking with family.    O: BP (!) 165/87 (BP Location: Right Arm)    Pulse 92    Temp 98.3 F (36.8 C) (Oral)    Resp 19    Ht 6' 3.5" (1.918 m)    Wt 130.6 kg    SpO2 97%    BMI 35.52 kg/m    GEN: alert, in no distress  CVS: regular rate  RESP:  no acute distress, speaking in full sentences without pause  MSK: s/p left BKA, wound VAC, able to lift LLE without difficulty, right bka     A/P: Left Foot Gangrene and Osteomyelitis S/P Left BKA  Pain well controlled. Tolerating po.  No change to plan.  See daily progress note for additional details.  - Orders reviewed. Labs for AM ordered, which was adjusted as needed.    Katha Cabal, DO 08/15/2021, 11:34 PM PGY-3, [facility] Night Resident  Please page [phone] with questions.

## 2021-08-15 NOTE — Anesthesia Procedure Notes (Addendum)
Anesthesia Regional Block: Popliteal block   Pre-Anesthetic Checklist: , timeout performed,  Correct Patient, Correct Site, Correct Laterality,  Correct Procedure, Correct Position, site marked,  Risks and benefits discussed,  Surgical consent,  Pre-op evaluation,  At surgeon's request and post-op pain management  Laterality: Left  Prep: chloraprep       Needles:  Injection technique: Single-shot  Needle Type: Echogenic Stimulator Needle          Additional Needles:   Procedures:,,,, ultrasound used (permanent image in chart),,    Narrative:  Start time: 08/15/2021 11:57 AM End time: 08/15/2021 12:07 PM Injection made incrementally with aspirations every 5 mL.  Performed by: Personally  Anesthesiologist: Heather Roberts, MD  Additional Notes: A functioning IV was confirmed and monitors were applied.  Sterile prep and drape, hand hygiene and sterile gloves were used.  Negative aspiration and test dose prior to incremental administration of local anesthetic. The patient tolerated the procedure well.Ultrasound  guidance: relevant anatomy identified, needle position confirmed, local anesthetic spread visualized around nerve(s), vascular puncture avoided.  Image printed for medical record. ADC supplement.

## 2021-08-16 ENCOUNTER — Encounter (HOSPITAL_COMMUNITY): Payer: Self-pay | Admitting: Vascular Surgery

## 2021-08-16 ENCOUNTER — Other Ambulatory Visit (HOSPITAL_COMMUNITY): Payer: Self-pay

## 2021-08-16 ENCOUNTER — Other Ambulatory Visit: Payer: Self-pay | Admitting: *Deleted

## 2021-08-16 DIAGNOSIS — Z89511 Acquired absence of right leg below knee: Secondary | ICD-10-CM | POA: Diagnosis not present

## 2021-08-16 DIAGNOSIS — I96 Gangrene, not elsewhere classified: Secondary | ICD-10-CM | POA: Diagnosis not present

## 2021-08-16 DIAGNOSIS — E1151 Type 2 diabetes mellitus with diabetic peripheral angiopathy without gangrene: Secondary | ICD-10-CM | POA: Diagnosis not present

## 2021-08-16 DIAGNOSIS — Z89512 Acquired absence of left leg below knee: Secondary | ICD-10-CM

## 2021-08-16 DIAGNOSIS — I739 Peripheral vascular disease, unspecified: Secondary | ICD-10-CM

## 2021-08-16 LAB — GLUCOSE, CAPILLARY
Glucose-Capillary: 154 mg/dL — ABNORMAL HIGH (ref 70–99)
Glucose-Capillary: 180 mg/dL — ABNORMAL HIGH (ref 70–99)
Glucose-Capillary: 141 mg/dL — ABNORMAL HIGH (ref 70–99)
Glucose-Capillary: 163 mg/dL — ABNORMAL HIGH (ref 70–99)

## 2021-08-16 LAB — CULTURE, BLOOD (ROUTINE X 2)
Culture: NO GROWTH
Culture: NO GROWTH
Special Requests: ADEQUATE
Special Requests: ADEQUATE

## 2021-08-16 LAB — CBC
HCT: 29.8 % — ABNORMAL LOW (ref 39.0–52.0)
Hemoglobin: 9.7 g/dL — ABNORMAL LOW (ref 13.0–17.0)
MCH: 26.7 pg (ref 26.0–34.0)
MCHC: 32.6 g/dL (ref 30.0–36.0)
MCV: 82.1 fL (ref 80.0–100.0)
Platelets: 567 10*3/uL — ABNORMAL HIGH (ref 150–400)
RBC: 3.63 MIL/uL — ABNORMAL LOW (ref 4.22–5.81)
RDW: 13 % (ref 11.5–15.5)
WBC: 11 10*3/uL — ABNORMAL HIGH (ref 4.0–10.5)
nRBC: 0 % (ref 0.0–0.2)

## 2021-08-16 LAB — BASIC METABOLIC PANEL
Anion gap: 9 (ref 5–15)
BUN: 16 mg/dL (ref 6–20)
CO2: 22 mmol/L (ref 22–32)
Calcium: 8 mg/dL — ABNORMAL LOW (ref 8.9–10.3)
Chloride: 106 mmol/L (ref 98–111)
Creatinine, Ser: 1.24 mg/dL (ref 0.61–1.24)
GFR, Estimated: >60 mL/min (ref >60)
Glucose, Bld: 173 mg/dL — ABNORMAL HIGH (ref 70–99)
Potassium: 3.8 mmol/L (ref 3.5–5.1)
Sodium: 137 mmol/L (ref 135–145)

## 2021-08-16 MED ORDER — AMLODIPINE BESYLATE 5 MG PO TABS
5.0000 mg | ORAL_TABLET | Freq: Every day | ORAL | Status: DC
  Administered 2021-08-16 – 2021-08-19 (×4): 5 mg via ORAL
  Filled 2021-08-16 (×4): qty 1

## 2021-08-16 MED ORDER — POLYETHYLENE GLYCOL 3350 17 G PO PACK: 17.0000 g | PACK | Freq: Every day | ORAL | Status: DC

## 2021-08-16 MED ORDER — POLYETHYLENE GLYCOL 3350 17 G PO PACK
17.0000 g | PACK | Freq: Two times a day (BID) | ORAL | Status: DC
  Filled 2021-08-16 (×4): qty 1

## 2021-08-16 MED ORDER — SENNA 8.6 MG PO TABS
1.0000 | ORAL_TABLET | Freq: Every day | ORAL | Status: DC
  Administered 2021-08-17: 09:00:00 8.6 mg via ORAL
  Filled 2021-08-16 (×4): qty 1

## 2021-08-16 NOTE — Progress Notes (Signed)
Discussed plan with Dr. Lenell Antu, who was okay with stopping antibiotics at this time. Unasyn discontinued.   Keegen Heffern, DO

## 2021-08-16 NOTE — Progress Notes (Signed)
PT Notes  Pt sitting up in chair on entry requesting to get back to bed after sitting up less than an hour in the recliner. Pt reports that he will be fine to go home and that his son will help him hop on his prosthetic despite not being able to stand with therapy. Pt requiring min-modA for lateral scoot transfer back to bed. PT recommending SNF level rehab to gain strength to power up on his R LE prosthetic. Pt will likely refuse SNF and HHPT services at discharge.    08/16/21 1200  PT Visit Information  Last PT Received On 08/16/21  Assistance Needed +2  History of Present Illness Pt is a 55 y.o. male admitted 08/11/21 with L foot pain; concern for L great toe gangrene. L foot MRI (+) for cellulitis and OM. Pt became septic and urgent L 1st met amputation was completed on 12/25. S/p angiography 12/27 which demonstrated occlusion of all 3 tibial vessels of LLE with no option for revascularization. 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), HTN, DM2, PAD, HLD, morbid obesity, OSA, OA  Subjective Data  Patient Stated Goal to go home  Precautions  Precautions Fall  Precaution Comments L BKA 12/28, prior R BKA (prosthesis in room)  Restrictions  Weight Bearing Restrictions Yes  LLE Weight Bearing NWB  Other Position/Activity Restrictions limb protector  Pain Assessment  Pain Assessment Faces  Faces Pain Scale 4  Pain Location L LE  Pain Descriptors / Indicators Discomfort  Pain Intervention(s) Limited activity within patient's tolerance;Monitored during session;Premedicated before session  Cognition  Arousal/Alertness Awake/alert  Behavior During Therapy Flat affect  Overall Cognitive Status Within Functional Limits for tasks assessed  Bed Mobility  Overal bed mobility Needs Assistance  Bed Mobility Supine to Sit  Supine to sit Min assist  General bed mobility comments min A and use of bed rails to come from supine to seated EoB from a flattened surface  Transfers  Overall transfer  level Needs assistance  Transfers Bed to chair/wheelchair/BSC  Bed to/from chair/wheelchair/BSC transfer type: Lateral/scoot transfer   Lateral/Scoot Transfers Min assist;Mod assist  General transfer comment educated pt on tramsferring to his R given extended positon of L LE and wound vac, pt reluctantly agrees, and recliner is placed perpendicular to bed facing HoB, PT provides assist for elevating residual limb, as pt scooting to bed pt pushes against recliner and it slides away despite bilateral wheel locks engaged, pt with R hip on bed and with modA from PT and aunt blocking recliner,  pt is able to roll over to bed. Pt is understandably upset as he now has his head at the foot of the bed.  Ambulation/Gait  General Gait Details unable  Balance  Overall balance assessment Needs assistance  Sitting balance-Leahy Scale Good  General Comments  General comments (skin integrity, edema, etc.) Aunt in room at end of session  PT - End of Session  Equipment Utilized During Treatment Gait belt  Activity Tolerance Patient limited by pain  Patient left in bed;with call bell/phone within reach;with bed alarm set;with family/visitor present  Nurse Communication Mobility status;Other (comment) (difficulty with transfer back to bed)   PT - Assessment/Plan  PT Plan Discharge plan needs to be updated  PT Visit Diagnosis Other abnormalities of gait and mobility (R26.89);Difficulty in walking, not elsewhere classified (R26.2);Pain  Pain - Right/Left Left  Pain - part of body Leg  PT Frequency (ACUTE ONLY) Min 3X/week  Follow Up Recommendations Skilled nursing-short term  rehab (<3 hours/day)  Assistance recommended at discharge Intermittent Supervision/Assistance  PT equipment None recommended by PT  Progressive Mobility  What is the highest level of mobility based on the progressive mobility assessment? Level 2 (Chairfast) - Balance while sitting on edge of bed and cannot stand  Mobility Sit up in  bed/chair position for meals  PT Goal Progression  Progress towards PT goals Not progressing toward goals - comment (limited by patient lack of participation)  Acute Rehab PT Goals  PT Goal Formulation With patient  Time For Goal Achievement 08/29/21  Potential to Achieve Goals Good  PT Time Calculation  PT Start Time (ACUTE ONLY) 1156  PT Stop Time (ACUTE ONLY) 1215  PT Time Calculation (min) (ACUTE ONLY) 19 min  PT General Charges  $$ ACUTE PT VISIT 1 Visit  PT Treatments  $Therapeutic Activity 8-22 mins   Wilho Sharpley B. Migdalia Dk PT, DPT Acute Rehabilitation Services Pager [phone] Office [phone]

## 2021-08-16 NOTE — Anesthesia Postprocedure Evaluation (Signed)
Anesthesia Post Note  Patient: Roy Taylor  Procedure(s) Performed: LEFT AMPUTATION BELOW KNEE WITH MYODESIS (Left: Knee) APPLICATION OF WOUND VAC (Left: Leg Lower)     Patient location during evaluation: PACU Anesthesia Type: General Level of consciousness: awake and alert Pain management: pain level controlled Vital Signs Assessment: post-procedure vital signs reviewed and stable Respiratory status: spontaneous breathing, nonlabored ventilation, respiratory function stable and patient connected to nasal cannula oxygen Cardiovascular status: blood pressure returned to baseline and stable Postop Assessment: no apparent nausea or vomiting Anesthetic complications: no   No notable events documented.                  Anselm Aumiller DANIEL

## 2021-08-16 NOTE — Progress Notes (Signed)
Family Medicine Teaching Service Daily Progress Note Intern Pager: [phone]  Patient name: Roy Taylor Medical record number: [id number] Date of birth: [birth year] Age: 55 y.o. Gender: male  Primary Care Provider: Renaldo Harrison, DO Consultants: VVS Code Status: Full Code   Pt Overview and Major Events to Date:  12/24 - Admitted 12/25 - L open first toe amputation 12/27 - Abdominal aortogram with lower extremity completed unsuccessful attempt at recanalization of the anterior tibial artery 12/28 - left BKA  Assessment and Plan: Roy Taylor is a 55 y.o. 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 HD 5   Left great toe gangrene s/p L BKA (12/28), POD 1 Afebrile, downtrending WBC (!) 11.0.  VVS following, appreciate assistance and recommendations Wound VAC and dressing changes, per VVS PT/OT IV Unasyn day 3, per VVS Scheduled Senokot and MiraLAX for constipation CBC  HTN SBP 140s-190s.  Considered restarting home lisinopril, however given elevated creatinine, will ultimately DC home lisinopril and start patient on amlodipine. Discontinued PRN antihypertensives Started amlodipine 5 mg daily Continue Coreg 12.5 mg BID  Intermittent tachycardia, RESOLVED Continue Coreg 12.5 mg BID  AKI Improving, now Cr 1.24.  Baseline ~0.9-1. BMP DC home lisinopril  T2DM HbA1c 9.1 (08/11/2021), last CBG 173 (IP CBG goal <180). Received 2 u of short acting. Moderate SSI  HLD LDL 141 on admission, however patient was not taking his statin prior.  But given his severe PAD, we will consider increasing his statin to high intensity. Rosuvastatin 10 mg nightly  FEN/GI:   Carb modified PPx: heparin injection 5,000 Units Start: 08/11/21 1445  Dispo: SNF per PT/OT, patient refused to go home.  DME dropped arm commode, bariatric.  Barrier includes wound and IV  antibiotics.  Subjective:  Roy Taylor was seen this AM. Stated that he feels good this morning, denies pain.  He had no acute concerns.  Objective: Temp:  [98.1 F (36.7 C)-99.7 F (37.6 C)] 98.3 F (36.8 C) (12/29 0545) Pulse Rate:  [86-98] 93 (12/29 0816) Resp:  [9-20] 20 (12/29 0545) BP: (141-180)/(71-96) 141/77 (12/29 1152) SpO2:  [93 %-98 %] 97 % (12/29 0545) Physical Exam Vitals and nursing note reviewed.  Constitutional:      General: He is awake. He is not in acute distress.    Appearance: He is not ill-appearing, toxic-appearing or diaphoretic.  HENT:     Head: Normocephalic and atraumatic.  Cardiovascular:     Rate and Rhythm: Normal rate and regular rhythm.     Heart sounds: No murmur heard.   No friction rub. No gallop.  Pulmonary:     Effort: Pulmonary effort is normal. No respiratory distress.     Breath sounds: Normal breath sounds. No wheezing or rales.  Abdominal:     General: There is no distension.     Palpations: Abdomen is soft.     Tenderness: There is no abdominal tenderness. There is no guarding or rebound.  Musculoskeletal:     Right Lower Extremity: Right leg is amputated below knee.     Left Lower Extremity: Left leg is amputated below knee.  Skin:    General: Skin is warm and dry.  Neurological:     Mental Status: He is alert.  Psychiatric:        Behavior: Behavior is cooperative.     Laboratory: Recent Labs  Lab 08/14/21 [phone] 08/15/21 0338 08/16/21  0123  WBC 15.5* 15.1* 11.0*  HGB 10.6* 10.4* 9.7*  HCT 33.4* 31.3* 29.8*  PLT 541* 551* 567*   Recent Labs  Lab 08/11/21 0900 08/12/21 0508 08/13/21 0245 08/14/21 0436 08/15/21 0338 08/16/21 0123  NA 135   < > 132* 135 136 137  K 3.4*   < > 3.7 4.2 3.8 3.8  CL 100   < > 100 106 107 106  CO2 25   < > 22 19* 20* 22  BUN 10   < > 15 17 18 16   CREATININE 0.94   < > 1.64* 1.62* 1.28* 1.24  CALCIUM 8.6*   < > 7.9* 8.3* 8.2* 8.0*  PROT 7.0  --  5.7*  --   --   --   BILITOT 0.6  --  0.8   --   --   --   ALKPHOS 89  --  89  --   --   --   ALT 19  --  15  --   --   --   AST 21  --  19  --   --   --   GLUCOSE 150*   < > 160* 169* 120* 173*   < > = values in this interval not displayed.   Results for orders placed or performed during the hospital encounter of 08/11/21 (from the past 24 hour(s))  Glucose, capillary     Status: Abnormal   Collection Time: 08/15/21  1:45 PM  Result Value Ref Range   Glucose-Capillary 116 (H) 70 - 99 mg/dL   Comment 1 Notify RN   Glucose, capillary     Status: Abnormal   Collection Time: 08/15/21  4:14 PM  Result Value Ref Range   Glucose-Capillary 112 (H) 70 - 99 mg/dL  Glucose, capillary     Status: Abnormal   Collection Time: 08/15/21  9:18 PM  Result Value Ref Range   Glucose-Capillary 142 (H) 70 - 99 mg/dL  CBC     Status: Abnormal   Collection Time: 08/16/21  1:23 AM  Result Value Ref Range   WBC 11.0 (H) 4.0 - 10.5 K/uL   RBC 3.63 (L) 4.22 - 5.81 MIL/uL   Hemoglobin 9.7 (L) 13.0 - 17.0 g/dL   HCT 08/18/21 (L) 99.3 - 71.6 %   MCV 82.1 80.0 - 100.0 fL   MCH 26.7 26.0 - 34.0 pg   MCHC 32.6 30.0 - 36.0 g/dL   RDW 96.7 89.3 - 81.0 %   Platelets 567 (H) 150 - 400 K/uL   nRBC 0.0 0.0 - 0.2 %  Basic metabolic panel     Status: Abnormal   Collection Time: 08/16/21  1:23 AM  Result Value Ref Range   Sodium 137 135 - 145 mmol/L   Potassium 3.8 3.5 - 5.1 mmol/L   Chloride 106 98 - 111 mmol/L   CO2 22 22 - 32 mmol/L   Glucose, Bld 173 (H) 70 - 99 mg/dL   BUN 16 6 - 20 mg/dL   Creatinine, Ser 08/18/21 0.61 - 1.24 mg/dL   Calcium 8.0 (L) 8.9 - 10.3 mg/dL   GFR, Estimated 1.02 >58 mL/min   Anion gap 9 5 - 15  Glucose, capillary     Status: Abnormal   Collection Time: 08/16/21  7:50 AM  Result Value Ref Range   Glucose-Capillary 154 (H) 70 - 99 mg/dL    Imaging/Diagnostic Tests: No results found.   08/18/21, DO 08/16/2021, 12:15 PM PGY-1, [place]  [place] Intern pager: [phone], text pages welcome

## 2021-08-16 NOTE — Progress Notes (Addendum)
[facility] [place]  Subjective  - Resting well this am   Objective (!) 159/82 98 98.3 F (36.8 C) (Oral) 20 97%  Intake/Output Summary (Last 24 hours) at 08/16/2021 9563 Last data filed at 08/16/2021 0542 Gross per 24 hour  Intake 1300 ml  Output 1375 ml  Net -75 ml    Left Stump protector in place, skin at knee is warm to touch Prevena Vac without drainage and a stump shrinker was applied  Assessment/Planning: POD # 1 Left BKA  Dressing change plans per DR. Krithik Mapel Will leave dressing in place F/U will be in 4 weeks for suture removal  Mosetta Pigeon 08/16/2021 7:05 AM --  Laboratory Lab Results: Recent Labs    08/15/21 0338 08/16/21 0123  WBC 15.1* 11.0*  HGB 10.4* 9.7*  HCT 31.3* 29.8*  PLT 551* 567*   BMET Recent Labs    08/15/21 0338 08/16/21 0123  NA 136 137  K 3.8 3.8  CL 107 106  CO2 20* 22  GLUCOSE 120* 173*  BUN 18 16  CREATININE 1.28* 1.24  CALCIUM 8.2* 8.0*    COAG Lab Results  Component Value Date   INR 1.2 11/13/2020   No results found for: PTT  VASCULAR STAFF ADDENDUM: I have independently interviewed and examined the patient. I agree with the above.  No need for Abx. Keep VAC on until discharge.   Rande Brunt. Lenell Antu, MD [facility] Phone Number: [phone] 08/16/2021 3:45 PM

## 2021-08-16 NOTE — Progress Notes (Signed)
Occupational Therapy Treatment Patient Details Name: Roy Taylor MRN: [id number] DOB: 1965/11/11 Today's Date: 08/16/2021   History of present illness Pt is a 55 y.o. male admitted 08/11/21 with L foot pain; concern for L great toe gangrene. L foot MRI (+) for cellulitis and OM. Pt became septic and urgent L 1st met amputation was completed on 12/25. S/p angiography 12/27 which demonstrated occlusion of all 3 tibial vessels of LLE with no option for revascularization. 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), HTN, DM2, PAD, HLD, morbid obesity, OSA, OA   OT comments  Pt transferred to drop arm chair with supervision. Educated in compensatory strategies for LB bathing, dressing and toileting leaning side to side. Pt with flat affect, but cooperative. Stating he would prefer to go home vs SNF.    Recommendations for follow up therapy are one component of a multi-disciplinary discharge planning process, led by the attending physician.  Recommendations may be updated based on patient status, additional functional criteria and insurance authorization.    Follow Up Recommendations  Skilled nursing-short term rehab (<3 hours/day)    Assistance Recommended at Discharge Frequent or constant Supervision/Assistance  Equipment Recommendations   (drop arm commode, bariatric)    Recommendations for Other Services      Precautions / Restrictions Precautions Precautions: Fall Precaution Comments: L BKA 12/28, prior R BKA (prosthesis in room) Restrictions Weight Bearing Restrictions: Yes LLE Weight Bearing: Non weight bearing Other Position/Activity Restrictions: limb protector       Mobility Bed Mobility Overal bed mobility: Modified Independent                  Transfers Overall transfer level: Needs assistance   Transfers: Bed to chair/wheelchair/BSC            Lateral/Scoot Transfers: Supervision General transfer comment: no difficulty with lateral transfer      Balance Overall balance assessment: Needs assistance   Sitting balance-Leahy Scale: Good                                     ADL either performed or assessed with clinical judgement   ADL Overall ADL's : Needs assistance/impaired     Grooming: Wash/dry hands;Wash/dry face;Sitting;Set up                                 General ADL Comments: Educated pt in donning pants and performing pericare leaning side to side.    Extremity/Trunk Assessment              Vision       Perception     Praxis      Cognition Arousal/Alertness: Awake/alert Behavior During Therapy: Flat affect Overall Cognitive Status: Within Functional Limits for tasks assessed                                            Exercises     Shoulder Instructions       General Comments      Pertinent Vitals/ Pain       Pain Assessment: Faces Faces Pain Scale: Hurts little more Pain Location: L LE Pain Descriptors / Indicators: Discomfort Pain Intervention(s): Monitored during session  Home Living  Prior Functioning/Environment              Frequency  Min 2X/week        Progress Toward Goals  OT Goals(current goals can now be found in the care plan section)  Progress towards OT goals: Progressing toward goals  Acute Rehab OT Goals OT Goal Formulation: With patient/family Time For Goal Achievement: 08/27/21 Potential to Achieve Goals: Good ADL Goals Pt Will Perform Lower Body Bathing: with set-up;sitting/lateral leans Pt Will Perform Lower Body Dressing: with supervision;with set-up;sitting/lateral leans Pt Will Transfer to Toilet: with modified independence;bedside commode;with transfer board Pt Will Perform Toileting - Clothing Manipulation and hygiene: with modified independence;sitting/lateral leans  Plan Discharge plan needs to be updated    Co-evaluation                  AM-PAC OT "6 Clicks" Daily Activity     Outcome Measure   Help from another person eating meals?: None Help from another person taking care of personal grooming?: A Little Help from another person toileting, which includes using toliet, bedpan, or urinal?: A Lot Help from another person bathing (including washing, rinsing, drying)?: A Lot Help from another person to put on and taking off regular upper body clothing?: A Little Help from another person to put on and taking off regular lower body clothing?: A Lot 6 Click Score: 16    End of Session    OT Visit Diagnosis: Pain   Activity Tolerance Patient tolerated treatment well   Patient Left in chair;with call bell/phone within reach;with family/visitor present   Nurse Communication          Time: 9276-3943 OT Time Calculation (min): 25 min  Charges: OT General Charges $OT Visit: 1 Visit OT Treatments $Self Care/Home Management : 23-37 mins  Nestor Lewandowsky, OTR/L Acute Rehabilitation Services Pager: [phone] Office: [phone]   Malka So 08/16/2021, 11:01 AM

## 2021-08-16 NOTE — Progress Notes (Signed)
FPTS Brief Progress Note  S: Patient sleeping comfortably on left side.  O: BP 138/78 (BP Location: Right Arm)    Pulse 96    Temp 98.3 F (36.8 C) (Oral)    Resp 19    Ht 6' 3.5" (1.918 m)    Wt 130.6 kg    SpO2 97%    BMI 35.52 kg/m   General: Sleeping, NAD  A/P: Left foot gangrene and osteomyelitis s/p left BKA Patient would benefit from SNF given now bilateral BKA. Unasyn discontinued per VS. Continue plan per day team. - Orders reviewed. Labs for AM ordered, which was adjusted as needed.   Shelby Mattocks, DO 08/16/2021, 9:20 PM PGY-1, [place] [facility] Night Resident  Please page [phone] with questions.

## 2021-08-17 ENCOUNTER — Other Ambulatory Visit (HOSPITAL_COMMUNITY): Payer: Self-pay

## 2021-08-17 DIAGNOSIS — I96 Gangrene, not elsewhere classified: Secondary | ICD-10-CM | POA: Diagnosis not present

## 2021-08-17 DIAGNOSIS — G4733 Obstructive sleep apnea (adult) (pediatric): Secondary | ICD-10-CM | POA: Diagnosis not present

## 2021-08-17 DIAGNOSIS — Z6835 Body mass index (BMI) 35.0-35.9, adult: Secondary | ICD-10-CM | POA: Diagnosis not present

## 2021-08-17 DIAGNOSIS — I739 Peripheral vascular disease, unspecified: Secondary | ICD-10-CM | POA: Diagnosis not present

## 2021-08-17 LAB — BASIC METABOLIC PANEL
Anion gap: 6 (ref 5–15)
BUN: 18 mg/dL (ref 6–20)
CO2: 22 mmol/L (ref 22–32)
Calcium: 7.7 mg/dL — ABNORMAL LOW (ref 8.9–10.3)
Chloride: 106 mmol/L (ref 98–111)
Creatinine, Ser: 1.3 mg/dL — ABNORMAL HIGH (ref 0.61–1.24)
GFR, Estimated: >60 mL/min (ref >60)
Glucose, Bld: 160 mg/dL — ABNORMAL HIGH (ref 70–99)
Potassium: 3.5 mmol/L (ref 3.5–5.1)
Sodium: 134 mmol/L — ABNORMAL LOW (ref 135–145)

## 2021-08-17 LAB — ACID FAST SMEAR (AFB, MYCOBACTERIA): Acid Fast Smear: NEGATIVE

## 2021-08-17 LAB — CBC WITH DIFFERENTIAL/PLATELET
Abs Immature Granulocytes: 0.11 10*3/uL — ABNORMAL HIGH (ref 0.00–0.07)
Basophils Absolute: 0.1 10*3/uL (ref 0.0–0.1)
Basophils Relative: 1 %
Eosinophils Absolute: 0.2 10*3/uL (ref 0.0–0.5)
Eosinophils Relative: 2 %
HCT: 28.8 % — ABNORMAL LOW (ref 39.0–52.0)
Hemoglobin: 9.3 g/dL — ABNORMAL LOW (ref 13.0–17.0)
Immature Granulocytes: 1 %
Lymphocytes Relative: 23 %
Lymphs Abs: 1.9 10*3/uL (ref 0.7–4.0)
MCH: 26.8 pg (ref 26.0–34.0)
MCHC: 32.3 g/dL (ref 30.0–36.0)
MCV: 83 fL (ref 80.0–100.0)
Monocytes Absolute: 0.8 10*3/uL (ref 0.1–1.0)
Monocytes Relative: 9 %
Neutro Abs: 5.3 10*3/uL (ref 1.7–7.7)
Neutrophils Relative %: 64 %
Platelets: 557 10*3/uL — ABNORMAL HIGH (ref 150–400)
RBC: 3.47 MIL/uL — ABNORMAL LOW (ref 4.22–5.81)
RDW: 13.1 % (ref 11.5–15.5)
WBC: 8.4 10*3/uL (ref 4.0–10.5)
nRBC: 0 % (ref 0.0–0.2)

## 2021-08-17 LAB — AEROBIC/ANAEROBIC CULTURE W GRAM STAIN (SURGICAL/DEEP WOUND)

## 2021-08-17 LAB — CULTURE, BLOOD (SINGLE): Culture: NO GROWTH

## 2021-08-17 LAB — SURGICAL PATHOLOGY

## 2021-08-17 LAB — GLUCOSE, CAPILLARY
Glucose-Capillary: 133 mg/dL — ABNORMAL HIGH (ref 70–99)
Glucose-Capillary: 154 mg/dL — ABNORMAL HIGH (ref 70–99)
Glucose-Capillary: 187 mg/dL — ABNORMAL HIGH (ref 70–99)
Glucose-Capillary: 165 mg/dL — ABNORMAL HIGH (ref 70–99)

## 2021-08-17 MED ORDER — SENNA 8.6 MG PO TABS
1.0000 | ORAL_TABLET | Freq: Every day | ORAL | 0 refills | Status: AC
  Filled 2021-08-17: qty 30, 30d supply, fill #0

## 2021-08-17 MED ORDER — ROSUVASTATIN CALCIUM 10 MG PO TABS
10.0000 mg | ORAL_TABLET | Freq: Every day | ORAL | 0 refills | Status: AC
Start: 2021-08-17 — End: 2021-09-16
  Filled 2021-08-17: qty 30, 30d supply, fill #0

## 2021-08-17 MED ORDER — ASPIRIN 81 MG PO TBEC
81.0000 mg | DELAYED_RELEASE_TABLET | Freq: Every day | ORAL | 11 refills | Status: AC
  Filled 2021-08-17: qty 30, 30d supply, fill #0

## 2021-08-17 MED ORDER — INSULIN DETEMIR 100 UNIT/ML FLEXPEN
15.0000 [IU] | PEN_INJECTOR | Freq: Every day | SUBCUTANEOUS | 0 refills | Status: AC
Start: 2021-08-17 — End: ?
  Filled 2021-08-17: qty 9, 60d supply, fill #0

## 2021-08-17 MED ORDER — ACETAMINOPHEN 325 MG PO TABS: 650.0000 mg | ORAL_TABLET | Freq: Four times a day (QID) | ORAL | Status: AC

## 2021-08-17 MED ORDER — OXYCODONE HCL 5 MG PO TABS
5.0000 mg | ORAL_TABLET | Freq: Four times a day (QID) | ORAL | 0 refills | Status: DC | PRN
  Filled 2021-08-17: qty 15, 4d supply, fill #0

## 2021-08-17 MED ORDER — CARVEDILOL 12.5 MG PO TABS
12.5000 mg | ORAL_TABLET | Freq: Two times a day (BID) | ORAL | 0 refills | Status: DC
  Filled 2021-08-17: qty 60, 30d supply, fill #0

## 2021-08-17 MED ORDER — POLYETHYLENE GLYCOL 3350 17 GM/SCOOP PO POWD
17.0000 g | Freq: Two times a day (BID) | ORAL | 0 refills | Status: AC
  Filled 2021-08-17: qty 238, 7d supply, fill #0

## 2021-08-17 MED ORDER — AMLODIPINE BESYLATE 5 MG PO TABS
5.0000 mg | ORAL_TABLET | Freq: Every day | ORAL | 0 refills | Status: DC
  Filled 2021-08-17: qty 30, 30d supply, fill #0

## 2021-08-17 NOTE — Progress Notes (Signed)
Looks good on rounds. Keep VAC on until discharge. Keep stump shrinking sock on at all times. Use stump protector as much as possible; always when mobilizing. Call if patient discharging this weekend, and we will evaluate the stump.  Otherwise I will check back in on Tuesday.  Rande Brunt. Lenell Antu, MD [facility] Phone Number: [phone] 08/17/2021 2:59 PM

## 2021-08-17 NOTE — Progress Notes (Signed)
Occupational Therapy Treatment Patient Details Name: Roy Taylor MRN: [id number] DOB: [birth year] Today's Date: 08/17/2021   History of present illness Pt is a 55 y.o. male admitted 08/11/21 with L foot pain; concern for L great toe gangrene. L foot MRI (+) for cellulitis and OM. Pt became septic and urgent L 1st met amputation was completed on 12/25. S/p angiography 12/27 which demonstrated occlusion of all 3 tibial vessels of LLE with no option for revascularization. 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), HTN, DM2, PAD, HLD, morbid obesity, OSA, OA   OT comments  Pt declining mobility progression this session, declining sitting up in chair, however agrees to demonstrate chair <> bed lateral scoot transfer. Pt supervision for seated ADLs, min A for bed mobility, and min guard for lateral scoot transfers. Pt presenting with impairments listed below, will continue to follow acutely to maximize independence with ADLs and functional mobility. Continue to recommend SNF at d/c, if pt declines then [place].   Recommendations for follow up therapy are one component of a multi-disciplinary discharge planning process, led by the attending physician.  Recommendations may be updated based on patient status, additional functional criteria and insurance authorization.    Follow Up Recommendations  Skilled nursing-short term rehab (<3 hours/day) (HHOT if pt declining SNF)    Assistance Recommended at Discharge Frequent or constant Supervision/Assistance  Equipment Recommendations  None recommended by OT;Other (comment) (pt states he has all needed DME at home)    Recommendations for Other Services PT consult    Precautions / Restrictions Precautions Precautions: Fall Precaution Comments: L BKA 12/28, prior R BKA (prosthesis in room) Restrictions Weight Bearing Restrictions: Yes LLE Weight Bearing: Non weight bearing Other Position/Activity Restrictions: limb protector       Mobility Bed  Mobility Overal bed mobility: Needs Assistance Bed Mobility: Supine to Sit     Supine to sit: Min assist     General bed mobility comments: min A using 1 person handheld assist to come EOB    Transfers Overall transfer level: Needs assistance Equipment used: None Transfers: Bed to chair/wheelchair/BSC            Lateral/Scoot Transfers: Min guard General transfer comment: Pt benefits from chair footrest up during transfer to adhere to WB precautions for LLE     Balance Overall balance assessment: Needs assistance Sitting-balance support: No upper extremity supported Sitting balance-Leahy Scale: Good Sitting balance - Comments: reaches outside BOS while sitting EOB                                   ADL either performed or assessed with clinical judgement   ADL                         Lower Body Dressing Details (indicate cue type and reason): pt with pants donned prior to session Toilet Transfer: Requires drop arm;Requires wide/bariatric;BSC/3in1;Supervision/safety Toilet Transfer Details (indicate cue type and reason): simulated lateral scoot transfer to chair, pt declines attempting standing transfers                Extremity/Trunk Assessment Upper Extremity Assessment Upper Extremity Assessment: Overall WFL for tasks assessed   Lower Extremity Assessment Lower Extremity Assessment: Defer to PT evaluation        Vision   Vision Assessment?: No apparent visual deficits   Perception Perception Perception: Not tested   Praxis Praxis  Praxis: Not tested    Cognition Arousal/Alertness: Awake/alert Behavior During Therapy: Flat affect Overall Cognitive Status: Within Functional Limits for tasks assessed                                            Exercises     Shoulder Instructions       General Comments CSW in room, pt declining SNF recommendation, states he has 24/7 care at home, has DME, and home is  accessible    Pertinent Vitals/ Pain       Pain Assessment: Faces Pain Score: 7  Pain Location: L LE Pain Descriptors / Indicators: Discomfort Pain Intervention(s): Limited activity within patient's tolerance;Monitored during session;Repositioned  Home Living                                          Prior Functioning/Environment              Frequency  Min 2X/week        Progress Toward Goals  OT Goals(current goals can now be found in the care plan section)  Progress towards OT goals: Progressing toward goals  Acute Rehab OT Goals Patient Stated Goal: to go home OT Goal Formulation: With patient Time For Goal Achievement: 08/27/21 Potential to Achieve Goals: Good ADL Goals Pt Will Perform Lower Body Bathing: with set-up;sitting/lateral leans Pt Will Perform Lower Body Dressing: with supervision;with set-up;sitting/lateral leans Pt Will Transfer to Toilet: with modified independence;bedside commode;with transfer board Pt Will Perform Toileting - Clothing Manipulation and hygiene: with modified independence;sitting/lateral leans  Plan Discharge plan remains appropriate;Frequency remains appropriate    Co-evaluation                 AM-PAC OT "6 Clicks" Daily Activity     Outcome Measure   Help from another person eating meals?: None Help from another person taking care of personal grooming?: A Little Help from another person toileting, which includes using toliet, bedpan, or urinal?: A Little Help from another person bathing (including washing, rinsing, drying)?: A Lot Help from another person to put on and taking off regular upper body clothing?: A Little Help from another person to put on and taking off regular lower body clothing?: A Little 6 Click Score: 18    End of Session    OT Visit Diagnosis: Repeated falls (R29.6);Pain;Muscle weakness (generalized) (M62.81) Pain - Right/Left: Left Pain - part of body: Leg   Activity  Tolerance Patient tolerated treatment well   Patient Left in bed;with bed alarm set;with call bell/phone within reach   Nurse Communication Mobility status        Time: 5456-2563 OT Time Calculation (min): 23 min  Charges: OT General Charges $OT Visit: 1 Visit OT Treatments $Self Care/Home Management : 23-37 mins  Lynnda Child, OTD, OTR/L Acute Rehab [phone] - [place] 08/17/2021, 10:17 AM

## 2021-08-17 NOTE — Progress Notes (Signed)
PT Cancellation Note  Patient Details Name: Roy Taylor MRN: [id number] DOB: Sep 07, 1965   Cancelled Treatment:    Reason Eval/Treat Not Completed: (P) Patient declined, no reason specified (Pt reports he worked with OT this am and he is not interested in participating this pm.  PTA provided education and encouragement and he continues to decline.)   Bibi Economos J Aundria Rud 08/17/2021, 2:38 PM  Bonney Leitz , PTA Acute Rehabilitation Services Pager [phone] Office [phone]

## 2021-08-17 NOTE — Progress Notes (Signed)
FPTS Brief Progress Note  S: Patient states he is doing well tonight and does not have any concerns.  He is just feeling tired and ready to sleep.  O: BP (!) 155/85 (BP Location: Right Arm)    Pulse 92    Temp 98.5 F (36.9 C) (Oral)    Resp 19    Ht 6' 3.5" (1.918 m)    Wt 130.6 kg    SpO2 100%    BMI 35.52 kg/m   General: Fatigued, NAD Cardiovascular: RRR Pulmonary: CTAB anteriorly Extremities: Bilateral BKA, left LE wound VAC  A/P: Left great toe gangrene s/p left BKA No changes in plan, continue per day team.  Awaiting SNF placement with TOC assistance.  Patient continues to work with PT/OT.  Patient to keep wound VAC on until discharge. - Orders reviewed. Labs for AM ordered, which was adjusted as needed.   Shelby Mattocks, DO 08/17/2021, 10:03 PM PGY-1,  Family Medicine Night Resident  Please page [phone] with questions.

## 2021-08-17 NOTE — Plan of Care (Signed)
°  Problem: Education: Goal: Knowledge of General Education information will improve Description: Including pain rating scale, medication(s)/side effects and non-pharmacologic comfort measures 08/17/2021 1737 by Genevie Ann, RN Outcome: Progressing 08/17/2021 1226 by Genevie Ann, RN Outcome: Progressing   Problem: Health Behavior/Discharge Planning: Goal: Ability to manage health-related needs will improve 08/17/2021 1737 by Genevie Ann, RN Outcome: Progressing 08/17/2021 1226 by Genevie Ann, RN Outcome: Progressing

## 2021-08-17 NOTE — NC FL2 (Signed)
MEDICAID FL2 LEVEL OF CARE SCREENING TOOL     IDENTIFICATION  Patient Name: Roy Taylor Birthdate: 1965-12-09 Sex: male Admission Date (Current Location): 08/11/2021  [facility] and [place] Number:  Producer, television/film/video and Address:  [facility], [address] [place], [place] [address]      Provider Number: [id number]  Attending Physician Name and Address:  Westley Chandler, MD  Relative Name and Phone Number:  Misner Ambrosini, [phone]    Current Level of Care: Hospital Recommended Level of Care: Skilled Nursing Facility Prior Approval Number:    Date Approved/Denied:   PASRR Number: Will not need PASRR if DC to SNF in [place]  Discharge Plan: SNF    Current Diagnoses: Patient Active Problem List   Diagnosis Date Noted   Gangrene of left foot (HCC) 08/11/2021   BMI 35.0-35.9,adult 08/11/2021   Hyperlipidemia associated with type 2 diabetes mellitus (HCC) 01/31/2021   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 (HCC) 12/27/2020   Phantom pain (HCC) 12/27/2020   Lower limb ischemia 11/13/2020   PAD (peripheral artery disease) (HCC) 11/04/2020   Diabetes mellitus type 2 with peripheral artery disease (HCC) 10/15/2010   Morbid obesity (HCC) 10/15/2010   OBSTRUCTIVE SLEEP APNEA 10/15/2010   Hypertension associated with diabetes (HCC) 10/15/2010   Unspecified hereditary and idiopathic peripheral neuropathy 01/29/2008    Orientation RESPIRATION BLADDER Height & Weight     Self, Time, Situation, Place  Normal Continent Weight: 288 lb (130.6 kg) Height:  6' 3.5" (191.8 cm)  BEHAVIORAL SYMPTOMS/MOOD NEUROLOGICAL BOWEL NUTRITION STATUS      Continent Diet (See DC summary)  AMBULATORY STATUS COMMUNICATION OF NEEDS Skin   Total Care Verbally Surgical wounds (Bilateral BKA)                       Personal Care Assistance Level of Assistance  Bathing, Feeding, Dressing Bathing Assistance: Limited assistance Feeding assistance:  Independent Dressing Assistance: Limited assistance     Functional Limitations Info  Sight, Hearing, Speech Sight Info: Adequate Hearing Info: Adequate Speech Info: Adequate    SPECIAL CARE FACTORS FREQUENCY  PT (By licensed PT), OT (By licensed OT)     PT Frequency: 5x week OT Frequency: 5x week            Contractures Contractures Info: Not present    Additional Factors Info  Code Status, Allergies, Insulin Sliding Scale Code Status Info: Full Allergies Info: NKA   Insulin Sliding Scale Info: Insulin Aspart (Novolog) 0-15 U 3x daily w/ meals       Current Medications (08/17/2021):  This is the current hospital active medication list Current Facility-Administered Medications  Medication Dose Route Frequency Provider Last Rate Last Admin   0.9 %  sodium chloride infusion  250 mL Intravenous PRN Lars Mage, PA-C   Stopped at 08/15/21 1616   acetaminophen (TYLENOL) tablet 650 mg  650 mg Oral Q6H Clinton Gallant M, PA-C   650 mg at 08/17/21 5462   acetaminophen (TYLENOL) tablet 650 mg  650 mg Oral Q4H PRN Clinton Gallant M, PA-C       amLODipine (NORVASC) tablet 5 mg  5 mg Oral Daily Alicia Amel, MD   5 mg at 08/17/21 7035   aspirin EC tablet 81 mg  81 mg Oral Daily Clinton Gallant M, PA-C   81 mg at 08/17/21 0093   carvedilol (COREG) tablet 12.5 mg  12.5 mg Oral BID WC  Clinton Gallant M, PA-C   12.5 mg at 08/17/21 3762   feeding supplement (ENSURE ENLIVE / ENSURE PLUS) liquid 237 mL  237 mL Oral Q24H Lars Mage, [place]   237 mL at 08/13/21 1354   heparin injection 5,000 Units  5,000 Units Subcutaneous Q8H Lars Mage, PA-C   5,000 Units at 08/17/21 8315   insulin aspart (novoLOG) injection 0-15 Units  0-15 Units Subcutaneous TID WC Lars Mage, PA-C   2 Units at 08/17/21 1761   multivitamin with minerals tablet 1 tablet  1 tablet Oral Daily Lars Mage, PA-C   1 tablet at 08/17/21 6073   nutrition supplement (JUVEN) (JUVEN) powder packet 1 packet  1 packet  Oral BID BM Lars Mage, PA-C   1 packet at 08/17/21 7106   ondansetron (ZOFRAN) injection 4 mg  4 mg Intravenous Q6H PRN Clinton Gallant M, PA-C       oxyCODONE (Oxy IR/ROXICODONE) immediate release tablet 5 mg  5 mg Oral Q6H PRN Lars Mage, PA-C   5 mg at 08/17/21 0434   polyethylene glycol (MIRALAX / GLYCOLAX) packet 17 g  17 g Oral BID Princess Bruins, DO       rosuvastatin (CRESTOR) tablet 10 mg  10 mg Oral QHS Clinton Gallant M, PA-C   10 mg at 08/16/21 2247   senna (SENOKOT) tablet 8.6 mg  1 tablet Oral Daily Princess Bruins, DO   8.6 mg at 08/17/21 2694   sodium chloride flush (NS) 0.9 % injection 3 mL  3 mL Intravenous Q12H Clinton Gallant M, PA-C   3 mL at 08/17/21 8546   sodium chloride flush (NS) 0.9 % injection 3 mL  3 mL Intravenous PRN Lars Mage, PA-C         Discharge Medications: Please see discharge summary for a list of discharge medications.  Relevant Imaging Results:  Relevant Lab Results:   Additional Information SSN # [id number]  2 Covid Vaccines, 1 booster  Carley Hammed, LCSWA

## 2021-08-17 NOTE — Plan of Care (Signed)
  Problem: Education: Goal: Knowledge of General Education information will improve Description Including pain rating scale, medication(s)/side effects and non-pharmacologic comfort measures Outcome: Progressing   Problem: Health Behavior/Discharge Planning: Goal: Ability to manage health-related needs will improve Outcome: Progressing   

## 2021-08-17 NOTE — TOC Initial Note (Addendum)
Transition of Care [facility]) - Initial/Assessment Note    Patient Details  Name: Roy Taylor MRN: [id number] Date of Birth: 12/27/1965  Transition of [facility]) CM/SW Contact:    Roy Taylor, [place] Phone Number: 08/17/2021, 10:14 AM  Clinical Narrative:                  3:55 RE had concerns about wound vac. Per surgeon it will be removed prior to DC, so facility will not need to handle it. They just want to check incision prior to DC. CSW left VM to update facility. Pt refused PT and will need a PT note prior to facility willing to accept him or be able to start auth. Medical team notified of barriers to DC. TOC will continue to follow.  2:20 Roy Taylor is currently reviewing pt. They noted they have several pt's already with wound vac's so they are determining if they can take pt. TOC will continue to follow for DC needs. Admissions [phone] option 2, [place] 11:25 Pt stating he is now agreeable to return to SNF at [facility]. Pt has had 3 covid vaccines. Plan is to return home with spouse at discharge. CSW will workup and faxout pt. TOC will continue to follow for DC needs.  CSW met with pt at bedside to discuss DC plans. CSW explained the recommendation for SNF at this time. Pt upset that he has been asked several times about this and has stated he will not go to a SNF. He states he has plenty of support at home and it is his choice. CSW noted that RN had received a call that spouse stated she did not want him to come home, and that she felt he needed rehab. Pt stated he no longer wanted to discuss it, and did not want to be asked about it again. Case was discussed with CM and spouse was called for update. CM advised spouse that pt was oriented and it was his choice to return home. TOC will see pt if he decides to change his plans. TOC will continue to follow for any further needs.  Expected Discharge Plan: [place] Barriers to Discharge: Continued Medical Work up,  Family Issues   Patient Goals and CMS Choice Patient states their goals for this hospitalization and ongoing recovery are:: Pt states he would like to go home after hospitalization.   Choice offered to / list presented to : Patient  Expected Discharge Plan and Services Expected Discharge Plan: [place] Choice: [place] arrangements for the past 2 months: Single Family Home                                      Prior Living Arrangements/Services Living arrangements for the past 2 months: Single Family Home Lives with:: Spouse Patient language and need for interpreter reviewed:: Yes Do you feel safe going back to the place where you live?: Yes      Need for Family Participation in Patient Care: Yes (Comment) Care giver support system in place?: Yes (comment)   Criminal Activity/Legal Involvement Pertinent to Current Situation/Hospitalization: No - Comment as needed  Activities of Daily Living Home Assistive Devices/Equipment: Prosthesis ADL Screening (condition at time of admission) Patient's cognitive ability adequate to safely complete daily activities?: Yes Is the patient deaf or have  difficulty hearing?: No Does the patient have difficulty seeing, even when wearing glasses/contacts?: No Does the patient have difficulty concentrating, remembering, or making decisions?: No Patient able to express need for assistance with ADLs?: Yes Does the patient have difficulty dressing or bathing?: No Independently performs ADLs?: Yes (appropriate for developmental age) Does the patient have difficulty walking or climbing stairs?: Yes Weakness of Legs: Left Weakness of Arms/Hands: None  Permission Sought/Granted Permission sought to share information with : Family Supports Permission granted to share information with : No              Emotional Assessment Appearance:: Appears stated age Attitude/Demeanor/Rapport:  Guarded Affect (typically observed): Defensive Orientation: : Oriented to Self, Oriented to Place, Oriented to  Time, Oriented to Situation Alcohol / Substance Use: Not Applicable Psych Involvement: No (comment)  Admission diagnosis:  Gangrene of foot ([place]) [I96] Gangrene of left foot ([place]) [I96] Patient Active Problem List   Diagnosis Date Noted   Gangrene of left foot ([place]) 08/11/2021   BMI 35.0-35.9,adult 08/11/2021   Hyperlipidemia associated with type 2 diabetes mellitus ([place]) 01/31/2021   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 ([place]) 12/27/2020   Phantom pain ([place]) 12/27/2020   Lower limb ischemia 11/13/2020   PAD (peripheral artery disease) ([place]) 11/04/2020   Diabetes mellitus type 2 with peripheral artery disease ([place]) 10/15/2010   Morbid obesity ([place]) 10/15/2010   OBSTRUCTIVE SLEEP APNEA 10/15/2010   Hypertension associated with diabetes ([place]) 10/15/2010   Unspecified hereditary and idiopathic peripheral neuropathy 01/29/2008   PCP:  Roy Heinrich, DO Pharmacy:   [place], [address] [address] Phone: [phone] Fax: [phone]     Social Determinants of Health (SDOH) Interventions    Readmission Risk Interventions No flowsheet data found.

## 2021-08-17 NOTE — Care Management (Addendum)
1011 08-17-21 Case Manager spoke with the patient and he is declining SNF- states he wants to return home. Case Manager called his spouse to make her aware that the patient is declining SNF services. Spouse states she works and her two adult sons are not able to be in the home with the patient. Spouse would like for the patient to return to [facility] SNF to get some additional rehab. Case Manager did make the patient aware of the above information regarding no supervision in the home. Patient states he needs to speak with his wife regarding the situation and asked if Case Manager could call him back later today. Case Manager will continue to follow for disposition needs.   1118 08-17-21 Case Manager received a secure chat from the staff that the patient wanted to speak with the [facility] team. Patient is now agreeable to SNF- [facility] in [place]. CSW is aware and will start the process.

## 2021-08-17 NOTE — Progress Notes (Signed)
Family Medicine Teaching Service Daily Progress Note Intern Pager: [phone]  Patient name: Roy Taylor Medical record number: [id number] Date of birth: 12/16/1965 Age: 55 y.o. Gender: male  Primary Care Provider: Renaldo Harrison, DO Consultants: VVS Code Status: Full code  Pt Overview and Major Events to Date:  12/24-admitted 2/25-left great toe amputation 12/27-unsuccessful recanalization of the left anterior tibial artery 12/28-left BKA  Assessment and Plan: Roy Taylor. Roy Taylor is a 55 year old male presented with left great toe gangrene with superimposed osteomyelitis.  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  WBC 8.4. Patient reports pain is well controlled. No complaint other than discomfort with the post surgery device. Tolerating PT/OT well. -VS following, appreciate recs -Continue wound VAC per VVS -IV Unasyn discontinued -Continue PT/OT -Tylenol 650 mg Q6H -Oxycodone 5 mg every 6 hours as needed -Continue MiraLAX bid -Continue senna daily  Hypertension BP 138-178/ 77-95. -Continue amlodipine 5 mg daily -Continue Coreg 12.5 mg twice daily  AKI Creatinine today is 1.30 (1.24 yesterday) .  Baseline Cr around 0.9-1 -Daily BMP -Home lisinopril discontinued  T2DM CBG last 24 hours 141-173.  Last CBG this a.m. 133 -Moderate SSI  HLD LDL on admission 141.  -Continue rosuvastatin 10 mg nightly.   FEN/GI: Carb modified diet PPx: Heparin inj 5000 units Dispo:SNF per PT/OT therapy. Barriers include awaiting SNF placement.   Subjective:  Mr. Roy Taylor was laying comfortably in bed on arrival this morning.  Patient reports feeling good with pain well controlled. He has no complaint other than discomfort with the surgical device on his left LE.   Objective: Temp:  [98.3 F (36.8 C)] 98.3 F (36.8 C) (12/30 0517) Pulse Rate:  [86-96] 86 (12/30 0517) Resp:  [17-19] 17  (12/30 0517) BP: (138-178)/(77-95) 178/95 (12/30 0517) SpO2:  [97 %] 97 % (12/30 0517) Physical Exam: General:Awake, well appearing, NAD HEENT: Atraumatic, MMM, No sclera icterus CV: RRR, no murmurs, normal S1/S2 Pulm: CTAB, no crackles or wheezing Abd: Soft, no distension, no tenderness Skin: dry, warm Ext: BLE BKA. Left LE covered with surgery device.   Laboratory: Recent Labs  Lab 08/15/21 0338 08/16/21 0123 08/17/21 0245  WBC 15.1* 11.0* 8.4  HGB 10.4* 9.7* 9.3*  HCT 31.3* 29.8* 28.8*  PLT 551* 567* 557*   Recent Labs  Lab 08/11/21 0900 08/12/21 0508 08/13/21 0245 08/14/21 0436 08/15/21 0338 08/16/21 0123 08/17/21 0245  NA 135   < > 132*   < > 136 137 134*  K 3.4*   < > 3.7   < > 3.8 3.8 3.5  CL 100   < > 100   < > 107 106 106  CO2 25   < > 22   < > 20* 22 22  BUN 10   < > 15   < > 18 16 18   CREATININE 0.94   < > 1.64*   < > 1.28* 1.24 1.30*  CALCIUM 8.6*   < > 7.9*   < > 8.2* 8.0* 7.7*  PROT 7.0  --  5.7*  --   --   --   --   BILITOT 0.6  --  0.8  --   --   --   --   ALKPHOS 89  --  89  --   --   --   --   ALT 19  --  15  --   --   --   --  AST 21  --  19  --   --   --   --   GLUCOSE 150*   < > 160*   < > 120* 173* 160*   < > = values in this interval not displayed.     Imaging/Diagnostic Tests: None   Jerre Simon, MD 08/17/2021, 7:33 AM PGY-1, [facility] Family Medicine FPTS Intern pager: [phone], text pages welcome

## 2021-08-18 DIAGNOSIS — E1151 Type 2 diabetes mellitus with diabetic peripheral angiopathy without gangrene: Secondary | ICD-10-CM | POA: Diagnosis not present

## 2021-08-18 DIAGNOSIS — I96 Gangrene, not elsewhere classified: Secondary | ICD-10-CM | POA: Diagnosis not present

## 2021-08-18 LAB — GLUCOSE, CAPILLARY
Glucose-Capillary: 162 mg/dL — ABNORMAL HIGH (ref 70–99)
Glucose-Capillary: 165 mg/dL — ABNORMAL HIGH (ref 70–99)
Glucose-Capillary: 157 mg/dL — ABNORMAL HIGH (ref 70–99)
Glucose-Capillary: 155 mg/dL — ABNORMAL HIGH (ref 70–99)

## 2021-08-18 LAB — BASIC METABOLIC PANEL
Anion gap: 8 (ref 5–15)
BUN: 20 mg/dL (ref 6–20)
CO2: 24 mmol/L (ref 22–32)
Calcium: 8.1 mg/dL — ABNORMAL LOW (ref 8.9–10.3)
Chloride: 105 mmol/L (ref 98–111)
Creatinine, Ser: 1.26 mg/dL — ABNORMAL HIGH (ref 0.61–1.24)
GFR, Estimated: >60 mL/min (ref >60)
Glucose, Bld: 155 mg/dL — ABNORMAL HIGH (ref 70–99)
Potassium: 4 mmol/L (ref 3.5–5.1)
Sodium: 137 mmol/L (ref 135–145)

## 2021-08-18 MED ORDER — CARVEDILOL 25 MG PO TABS
25.0000 mg | ORAL_TABLET | Freq: Two times a day (BID) | ORAL | Status: DC
  Administered 2021-08-18 – 2021-08-21 (×6): 25 mg via ORAL
  Filled 2021-08-18 (×6): qty 1

## 2021-08-18 NOTE — Progress Notes (Signed)
Family Medicine Teaching Service Daily Progress Note Intern Pager: [phone]  Patient name: Roy Taylor Medical record number: [id number] Date of birth: [birth year] Age: 55 y.o. Gender: male  Primary Care Provider: Renaldo Harrison, DO Consultants: VVS Code Status: Full  Pt Overview and Major Events to Date:  12/24-admitted 2/25-left great toe amputation 12/27-unsuccessful recanalization of the left anterior tibial artery 12/28-left BKA  Assessment and Plan: Roy Taylor. Roy Taylor is a 55 year old male presented with left great toe gangrene with superimposed osteomyelitis.  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  Patient is now medically stable for discharge to SNF.  Left great toe gangrene POD #3 s/p L BKA. Afebrile, pain well-controlled. Continues working with PT/OT.  No complaints this morning.  Patient is medically stable for discharge to SNF.  - Wound care per VVS - PT/OT - Tylenol 650mg  q6h - Oxycodone 5mg  q6h PRN  HTN Pt has been persistently hypertensive, up to 180/93. HR in high 80s-low90s. Have been holding home lisinopril in the setting of elevated Cr (1.30>1.26; baseline 0.9-1). Currently on amlodipine 5mg  daily and Coreg 12.5mg  BID.  - Continue amlodipine 5mg  daily - Increase Coreg to 25mg  BID - Holding home lisinopril in the setting of AKI  AKI Downtrending 1.30>1.26. Baseline 0.9-1. - Trend on BMP - Holding lisinopril as above  T2DM CBGs 154-187. - Continue mSSI   FEN/GI: Carb-modified PPx: Heparin Dispo:SNF today. Barriers include placement.   Subjective:  Roy Taylor reports that he feels well today.  He expresses that he is ready to get out of the hospital and hopes that he will discharge soon.  I shared that it is unlikely he will be discharged to SNF over the holiday weekend but that I am hopeful for early next week.  Objective: Temp:  [98.2 F (36.8 C)-98.8 F (37.1 C)] 98.2 F (36.8 C) (12/31 0553) Pulse Rate:  [91-92] 91 (12/31  0553) Resp:  [19] 19 (12/31 0553) BP: (155-180)/(85-94) 180/93 (12/31 0553) SpO2:  [97 %-100 %] 97 % (12/31 0553) Physical Exam: General: Awake, NAD Cardiovascular: RRR, no m/r/g Respiratory: Normal WOB on RA, No wheezes/rales/rhonchi Abdomen: Soft, non-tender, no mass Extremities: BLE BKA, LLE with dressing and wound vac in place, wound vac canister with no drainage.   Laboratory: Recent Labs  Lab 08/15/21 0338 08/16/21 0123 08/17/21 0245  WBC 15.1* 11.0* 8.4  HGB 10.4* 9.7* 9.3*  HCT 31.3* 29.8* 28.8*  PLT 551* 567* 557*   Recent Labs  Lab 08/11/21 0900 08/12/21 0508 08/13/21 0245 08/14/21 0436 08/16/21 0123 08/17/21 0245 08/18/21 0142  NA 135   < > 132*   < > 137 134* 137  K 3.4*   < > 3.7   < > 3.8 3.5 4.0  CL 100   < > 100   < > 106 106 105  CO2 25   < > 22   < > 22 22 24   BUN 10   < > 15   < > 16 18 20   CREATININE 0.94   < > 1.64*   < > 1.24 1.30* 1.26*  CALCIUM 8.6*   < > 7.9*   < > 8.0* 7.7* 8.1*  PROT 7.0  --  5.7*  --   --   --   --   BILITOT 0.6  --  0.8  --   --   --   --   ALKPHOS 89  --  89  --   --   --   --  ALT 19  --  15  --   --   --   --   AST 21  --  19  --   --   --   --   GLUCOSE 150*   < > 160*   < > 173* 160* 155*   < > = values in this interval not displayed.    Imaging/Diagnostic Tests: No new imaging, tests  Alicia Amel, MD 08/18/2021, 6:36 AM PGY-1, [facility] Family Medicine FPTS Intern pager: [phone], text pages welcome

## 2021-08-18 NOTE — Progress Notes (Signed)
FPTS Brief Progress Note  S: Upon night rounding, patient was comfortably sleeping.   O: BP (!) 160/75 (BP Location: Left Arm)    Pulse 99    Temp 98.2 F (36.8 C) (Oral)    Resp 20    Ht 6' 3.5" (1.918 m)    Wt 130.6 kg    SpO2 97%    BMI 35.52 kg/m   General: Sleeping in bed  A/P: Left great toe gangrene Patient is medically stable for discharge to SNF.  Continue management per day team  HTN Blood pressure improved since increasing Coreg dosing.  Continue management per day team.  - Orders reviewed. Labs for AM ordered, which was adjusted as needed.   Shelby Mattocks, DO 08/18/2021, 11:25 PM PGY-1,  Family Medicine Night Resident  Please page [phone] with questions.

## 2021-08-19 DIAGNOSIS — I96 Gangrene, not elsewhere classified: Secondary | ICD-10-CM | POA: Diagnosis not present

## 2021-08-19 LAB — BASIC METABOLIC PANEL
Anion gap: 8 (ref 5–15)
BUN: 17 mg/dL (ref 6–20)
CO2: 24 mmol/L (ref 22–32)
Calcium: 8.4 mg/dL — ABNORMAL LOW (ref 8.9–10.3)
Chloride: 106 mmol/L (ref 98–111)
Creatinine, Ser: 1.01 mg/dL (ref 0.61–1.24)
GFR, Estimated: >60 mL/min (ref >60)
Glucose, Bld: 156 mg/dL — ABNORMAL HIGH (ref 70–99)
Potassium: 3.8 mmol/L (ref 3.5–5.1)
Sodium: 138 mmol/L (ref 135–145)

## 2021-08-19 LAB — GLUCOSE, CAPILLARY
Glucose-Capillary: 154 mg/dL — ABNORMAL HIGH (ref 70–99)
Glucose-Capillary: 167 mg/dL — ABNORMAL HIGH (ref 70–99)
Glucose-Capillary: 190 mg/dL — ABNORMAL HIGH (ref 70–99)
Glucose-Capillary: 156 mg/dL — ABNORMAL HIGH (ref 70–99)

## 2021-08-19 MED ORDER — LOSARTAN POTASSIUM 50 MG PO TABS
50.0000 mg | ORAL_TABLET | Freq: Every day | ORAL | Status: DC
  Administered 2021-08-20 – 2021-08-21 (×2): 50 mg via ORAL
  Filled 2021-08-19 (×2): qty 1

## 2021-08-19 MED ORDER — LISINOPRIL 20 MG PO TABS
20.0000 mg | ORAL_TABLET | Freq: Every day | ORAL | Status: DC
  Administered 2021-08-19: 20 mg via ORAL
  Filled 2021-08-19: qty 1

## 2021-08-19 NOTE — Progress Notes (Addendum)
Family Medicine Teaching Service Daily Progress Note Intern Pager: [phone]  Patient name: Roy Taylor Medical record number: [id number] Date of birth: 1966-06-20 Age: 56 y.o. Gender: male  Primary Care Provider: Renaldo Harrison, DO Consultants: VVS Code Status: Full  Pt Overview and Major Events to Date:  12/24-admitted 2/25- L great toe amputation 12/27-unsuccessful recanalization of the left anterior tibial artery 12/28-left BKA  Assessment and Plan:  [name].Rumery is is a 56 year old male presenting with left toe gangrene with superimposed osteomyelitis.  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 Medically stable and awaiting SNF placement.  Left great toe gangrene     POD #5 Patient is postop day 5 of LE BKA.  Remains afebrile.  Pain is well controlled. LLE with wound vac in place and surgical dressings. No sign of draining. Per VVS will leave stump socks on and wound VAC removal prior to discharge. -VVS following, appreciate recs -Wound care per VVS -Tylenol 600 mg every 6 hours -PT/OT  HTN BP in last 24 hours 167-197/84-110.  Home medication includes lisinopril.  Stopped lisinopril due to recent AKI and started on losartan. Currently taking losartan, coreg and amlodipine -Continue losartan 50 mg daily -Continue Coreg 25 mg twice daily -Increased amlodipine to 10 mg daily  AKI, stable Creatinine is 1.01 (baseline ~ 1.0)  T2DM CBG range 154-190 -Continue mSSI   FEN/GI: Carb modified PPx: None Dispo:SNF  awaiting bed availability . Barriers include pending placement..   Subjective:  Roy Taylor has no complaints this morning.  He reports sleeping okay last night mostly due to nurse going in and out of his room. He understand plan is to remove wound VAC prior to discharge to SNF.  Objective: Temp:  [97.8 F (36.6 C)-98.5 F (36.9 C)] 97.8 F (36.6 C) (01/01 2100) Pulse Rate:  [86-92] 92 (01/01 2100) Resp:  [19-20] 20 (01/01 2100) BP:  (167-186)/(84-95) 171/90 (01/01 2100) SpO2:  [93 %-97 %] 93 % (01/01 2100) General:Awake, well appearing, NAD HEENT: Atraumatic, MMM, No sclera icterus CV: RRR, no murmurs, normal S1/S2 Pulm: CTAB, good WOB on RA, on 5L O2 via trache Abd: Soft, no distension, no tenderness Skin: dry, warm Ext: BLE BKA. LLE with wound vac in place and surgical dressing.  Laboratory: Recent Labs  Lab 08/15/21 0338 08/16/21 0123 08/17/21 0245  WBC 15.1* 11.0* 8.4  HGB 10.4* 9.7* 9.3*  HCT 31.3* 29.8* 28.8*  PLT 551* 567* 557*   Recent Labs  Lab 08/13/21 0245 08/14/21 0436 08/17/21 0245 08/18/21 0142 08/19/21 0300  NA 132*   < > 134* 137 138  K 3.7   < > 3.5 4.0 3.8  CL 100   < > 106 105 106  CO2 22   < > 22 24 24   BUN 15   < > 18 20 17   CREATININE 1.64*   < > 1.30* 1.26* 1.01  CALCIUM 7.9*   < > 7.7* 8.1* 8.4*  PROT 5.7*  --   --   --   --   BILITOT 0.8  --   --   --   --   ALKPHOS 89  --   --   --   --   ALT 15  --   --   --   --   AST 19  --   --   --   --   GLUCOSE 160*   < > 160* 155* 156*   < > = values in this interval  not displayed.    Imaging/Diagnostic Tests: No new imaging/test  Jerre Simon, MD 08/19/2021, 11:51 PM PGY-1, [facility] Family Medicine FPTS Intern pager: [phone], text pages welcome

## 2021-08-19 NOTE — Plan of Care (Signed)
  Problem: Health Behavior/Discharge Planning: Goal: Ability to manage health-related needs will improve Outcome: Progressing   Problem: Clinical Measurements: Goal: Ability to maintain clinical measurements within normal limits will improve Outcome: Progressing Goal: Will remain free from infection Outcome: Progressing   

## 2021-08-19 NOTE — Plan of Care (Signed)

## 2021-08-19 NOTE — Progress Notes (Addendum)
Occupational Therapy Treatment Patient Details Name: Roy Taylor MRN: [id number] DOB: 02-Nov-1965 Today's Date: 08/19/2021   History of present illness Pt is a 56 y.o. male admitted 08/11/21 with L foot pain; concern for L great toe gangrene. L foot MRI (+) for cellulitis and OM. Pt became septic and urgent L 1st met amputation was completed on 12/25. S/p angiography 12/27 which demonstrated occlusion of all 3 tibial vessels of LLE with no option for revascularization. 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 Began education on safe BSC transfers to increase toileting independence. However, unable to safely attempt [facility] practice today due to need for larger, drop arm BSC in pt room (continued staff collaboration in progress to obtain this DME in room).    Recommendations for follow up therapy are one component of a multi-disciplinary discharge planning process, led by the attending physician.  Recommendations may be updated based on patient status, additional functional criteria and insurance authorization.    Follow Up Recommendations  Skilled nursing-short term rehab (<3 hours/day)    Assistance Recommended at Discharge Frequent or constant Supervision/Assistance  Equipment Recommendations  None recommended by OT (pt reports having all needed DME)    Recommendations for Other Services      Precautions / Restrictions Precautions Precautions: Fall Precaution Comments: L BKA 12/28, prior R BKA (prosthesis in room) Restrictions Weight Bearing Restrictions: Yes LLE Weight Bearing: Non weight bearing Other Position/Activity Restrictions: limb protector       Mobility Bed Mobility Overal bed mobility: Modified Independent              General bed mobility comments: to pull self up to long sitting    Transfers                   General transfer comment: declined, reported just getting back to bed from chair     Balance Overall balance assessment: Needs assistance Sitting-balance support: No upper extremity supported Sitting balance-Leahy Scale: Good                                     ADL either performed or assessed with clinical judgement   ADL Overall ADL's : Needs assistance/impaired                                       General ADL Comments: Discussed LB ADL compensatory strategies with pt reportiing feeling confident with this, as well as wound vac mgmt through clothing - pt declined to demonstrate these tasks this AM. Provided UE HEP with theraband to maximize strength, self limiting in completion of all exercises, requires verbal cues for technique. Discussed BSC transfers - only small std BSC in room. Collaborating wtih nursing staff to find bari drop arm commode to maximize toileting independence. pt reports using bedpan "independently" at acute level, will need to confirm with nursing    Extremity/Trunk Assessment Upper Extremity Assessment Upper Extremity Assessment: Overall WFL for tasks assessed   Lower Extremity Assessment Lower Extremity Assessment: Defer to PT evaluation        Vision  Vision Assessment?: No apparent visual deficits   Perception     Praxis      Cognition Arousal/Alertness: Awake/alert Behavior During Therapy: Flat affect Overall Cognitive Status: Within Functional Limits for tasks assessed                                 General Comments: self limiting, some decreased awareness of ability to complete tasks at home that have not been demonstrated at acute level          Exercises Exercises: General Upper Extremity General Exercises - Upper Extremity Shoulder Flexion: Strengthening;Both;5  reps;Seated;Theraband Theraband Level (Shoulder Flexion): Level 4 (Blue) Shoulder Horizontal ABduction: Strengthening;Both;5 reps;Seated;Theraband Theraband Level (Shoulder Horizontal Abduction): Level 4 (Blue) Elbow Extension: Strengthening;Both;5 reps;Seated;Theraband Theraband Level (Elbow Extension): Level 4 (Blue)   Shoulder Instructions       General Comments      Pertinent Vitals/ Pain       Pain Assessment: No/denies pain  Home Living                                          Prior Functioning/Environment              Frequency  Min 2X/week        Progress Toward Goals  OT Goals(current goals can now be found in the care plan section)  Progress towards OT goals: Progressing toward goals  Acute Rehab OT Goals Patient Stated Goal: go home soon OT Goal Formulation: With patient Time For Goal Achievement: 08/27/21 Potential to Achieve Goals: Good ADL Goals Pt Will Perform Lower Body Bathing: with set-up;sitting/lateral leans Pt Will Perform Lower Body Dressing: with supervision;with set-up;sitting/lateral leans Pt Will Transfer to Toilet: with modified independence;bedside commode;with transfer board Pt Will Perform Toileting - Clothing Manipulation and hygiene: with modified independence;sitting/lateral leans  Plan Discharge plan remains appropriate;Frequency remains appropriate    Co-evaluation                 AM-PAC OT "6 Clicks" Daily Activity     Outcome Measure   Help from another person eating meals?: None Help from another person taking care of personal grooming?: A Little Help from another person toileting, which includes using toliet, bedpan, or urinal?: A Little Help from another person bathing (including washing, rinsing, drying)?: A Lot Help from another person to put on and taking off regular upper body clothing?: A Little Help from another person to put on and taking off regular lower body clothing?: A Little 6  Click Score: 18    End of Session    OT Visit Diagnosis: Repeated falls (R29.6);Pain;Muscle weakness (generalized) (M62.81) Pain - Right/Left: Left Pain - part of body: Leg   Activity Tolerance Patient tolerated treatment well   Patient Left in bed;with call bell/phone within reach   Nurse Communication Mobility status;Other (comment) (need for bigger BSC)        Time: 2919-1660 OT Time Calculation (min): 18 min  Charges: OT Treatments $Therapeutic Activity: 8-22 mins  Malachy Chamber, OTR/L Acute Rehab Services Office: [phone]   Layla Maw 08/19/2021, 10:39 AM

## 2021-08-19 NOTE — Progress Notes (Addendum)
Family Medicine Teaching Service Daily Progress Note Intern Pager: [phone]  Patient name: Roy Taylor Medical record number: [id number] Date of birth: [birth year] Age: 56 y.o. Gender: male  Primary Care Provider: Michell Heinrich, DO Consultants: [facility] Code Status: Full  Pt Overview and Major Events to Date:  12/24-admitted 2/25-left great toe amputation 12/27-unsuccessful recanalization of the left anterior tibial artery 12/28-left BKA  Assessment and Plan: Roy Taylor. Roy Taylor is a 56 year old male presented with left great toe gangrene with superimposed osteomyelitis.  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  Patient is now medically stable for discharge to SNF.  Left Great Toe Gangrene POD #4 s/p L BKA. Remains afebrile and with adequate pain control. Has not required PRN oxycodone >24hours. Anticipate removal of wound vac prior to discharge. Patient is medically stable for discharge to SNF. - Wound care per VVS - PT/OT - Tylenol 650mg  [id number]  - Will d/c oxycodone  HTN Remains quite hypertensive despite increasing Coreg dose yesterday. Last BP 186/95. Had been holding home lisinopril in the setting of elevated Cr. However, Cr is now back to baseline. Received one dose lisinopril this am. - Will transition to ARB, Losartan 50mg  daily starting tomorrow - Continue Coreg and amlodipine  AKI Back to baseline. Cr 1.01 today.  T2DM CBGs 155-165. - Continue mSSI  FEN/GI: Carb-modified PPx: None Dispo:SNF today. Barriers include pending placement.   Subjective:  Roy Taylor reports no acute complaints this morning.  He says that his pain is "actually a lot better" today.  He says that per his discussion with vascular surgery, the plan is to remove the wound VAC prior to discharge to SNF.  He is eager to discharge and to get to therapy so that he can return home.  Objective: Temp:  [98.2 F (36.8 C)] 98.2 F (36.8 C) (01/01 0418) Pulse Rate:  [86-99] 86 (01/01  0418) Resp:  [19-20] 19 (01/01 0418) BP: (160-186)/(75-95) 186/95 (01/01 0418) SpO2:  [97 %] 97 % (01/01 0418) Physical Exam: General: Awake, alert, NAD Cardiovascular: RRR, no m/r/g Respiratory: Normal WOB on RA, without wheezes, rales, rhonchi Abdomen: Soft, non-tender Extremities: BLE BKA, LLE with surgical dressing and wound vac in place. Wound vac canister with no drainage. No erythema or tenderness extending proximally from dressing.   Laboratory: Recent Labs  Lab 08/15/21 0338 08/16/21 0123 08/17/21 0245  WBC 15.1* 11.0* 8.4  HGB 10.4* 9.7* 9.3*  HCT 31.3* 29.8* 28.8*  PLT 551* 567* 557*   Recent Labs  Lab 08/13/21 0245 08/14/21 0436 08/17/21 0245 08/18/21 0142 08/19/21 0300  NA 132*   < > 134* 137 138  K 3.7   < > 3.5 4.0 3.8  CL 100   < > 106 105 106  CO2 22   < > 22 24 24   BUN 15   < > 18 20 17   CREATININE 1.64*   < > 1.30* 1.26* 1.01  CALCIUM 7.9*   < > 7.7* 8.1* 8.4*  PROT 5.7*  --   --   --   --   BILITOT 0.8  --   --   --   --   ALKPHOS 89  --   --   --   --   ALT 15  --   --   --   --   AST 19  --   --   --   --   GLUCOSE 160*   < > 160* 155* 156*   < > =  values in this interval not displayed.    Imaging/Diagnostic Tests: No new imaging, tests  [name], MD 08/19/2021, 6:39 AM PGY-1, [facility] Intern pager: [phone], text pages welcome

## 2021-08-20 LAB — GLUCOSE, CAPILLARY
Glucose-Capillary: 151 mg/dL — ABNORMAL HIGH (ref 70–99)
Glucose-Capillary: 164 mg/dL — ABNORMAL HIGH (ref 70–99)
Glucose-Capillary: 228 mg/dL — ABNORMAL HIGH (ref 70–99)
Glucose-Capillary: 173 mg/dL — ABNORMAL HIGH (ref 70–99)

## 2021-08-20 MED ORDER — AMLODIPINE BESYLATE 10 MG PO TABS
10.0000 mg | ORAL_TABLET | Freq: Every day | ORAL | Status: DC
  Administered 2021-08-20 – 2021-08-21 (×2): 10 mg via ORAL
  Filled 2021-08-20 (×2): qty 1

## 2021-08-20 MED ORDER — ENSURE MAX PROTEIN PO LIQD
11.0000 [oz_av] | Freq: Every day | ORAL | Status: DC
  Filled 2021-08-20 (×2): qty 330

## 2021-08-20 NOTE — Progress Notes (Signed)
Physical Therapy Treatment Patient Details Name: Roy Taylor MRN: [id number] DOB: 1965/12/10 Today's Date: 08/20/2021   History of Present Illness Pt is a 56 y.o. male admitted 08/11/21 with L foot pain; concern for L great toe gangrene. L foot MRI (+) for cellulitis and OM. Pt became septic and urgent L 1st met amputation was completed on 12/25. S/p angiography 12/27 which demonstrated occlusion of all 3 tibial vessels of LLE with no option for revascularization. 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 Pt mobilizing at bed-level with supervision-close guard support, pt declines OOB transfer today and declines donning prosthesis for attempted stand. PT focused session on BKA exercise program, pt tolerated well and demonstrates improving strength and coordination. PT to continue to follow acutely.    Recommendations for follow up therapy are one component of a multi-disciplinary discharge planning process, led by the attending physician.  Recommendations may be updated based on patient status, additional functional criteria and insurance authorization.  Follow Up Recommendations  Skilled nursing-short term rehab (<3 hours/day)     Assistance Recommended at Discharge Intermittent Supervision/Assistance  Equipment Recommendations  None recommended by PT    Recommendations for Other Services       Precautions / Restrictions Precautions Precautions: Fall Precaution Comments: L BKA 12/28, prior R BKA (prosthesis in room) Restrictions Weight Bearing Restrictions: Yes LLE Weight Bearing: Non weight bearing Other Position/Activity Restrictions: limb protector     Mobility  Bed Mobility Overal bed mobility: Needs Assistance Bed Mobility: Supine to Sit;Sit to Supine;Rolling Rolling: Supervision   Supine to sit: Supervision Sit to supine: Supervision   General bed mobility comments: for  safety, use of bedrails and increased time    Transfers Overall transfer level: Needs assistance Equipment used: None Transfers: Bed to chair/wheelchair/BSC            Lateral/Scoot Transfers: Min guard General transfer comment: for safety, scooting at EOB x4 each direction with good power through UEs. Pt declines OOB transfer, either scoot pivot or stand pivot with prosthesis    Ambulation/Gait                   Stairs             Wheelchair Mobility    Modified Rankin (Stroke Patients Only)       Balance Overall balance assessment: Needs assistance Sitting-balance support: No upper extremity supported Sitting balance-Leahy Scale: Good                                      Cognition Arousal/Alertness: Awake/alert Behavior During Therapy: Flat affect Overall Cognitive Status: Within Functional Limits for tasks assessed                                          Exercises Amputee Exercises Quad Sets: AROM;Left;15 reps;Seated Gluteal Sets: AROM;Both;15 reps;Supine Hip ABduction/ADduction: AROM;Left;15 reps;Supine Knee Extension: AROM;15 reps;Both;Seated Straight Leg Raises: AROM;Left;15 reps;Supine    General Comments        Pertinent Vitals/Pain Pain Assessment: Faces Faces Pain Scale: Hurts a little bit Pain Location: L LE when sitting EOB Pain Descriptors / Indicators: Discomfort Pain Intervention(s): Limited activity within patient's tolerance;Monitored during session;Repositioned  Home Living                          Prior Function            PT Goals (current goals can now be found in the care plan section) Acute Rehab PT Goals Patient Stated Goal: to go home PT Goal Formulation: With patient Time For Goal Achievement: 08/29/21 Potential to Achieve Goals: Good Progress towards PT goals: Progressing toward goals    Frequency    Min 3X/week      PT Plan Current plan remains  appropriate    Co-evaluation              AM-PAC PT "6 Clicks" Mobility   Outcome Measure  Help needed turning from your back to your side while in a flat bed without using bedrails?: None Help needed moving from lying on your back to sitting on the side of a flat bed without using bedrails?: A Little Help needed moving to and from a bed to a chair (including a wheelchair)?: A Little Help needed standing up from a chair using your arms (e.g., wheelchair or bedside chair)?: Total Help needed to walk in hospital room?: Total Help needed climbing 3-5 steps with a railing? : Total 6 Click Score: 13    End of Session   Activity Tolerance: Patient tolerated treatment well Patient left: in bed;with call bell/phone within reach;with bed alarm set Nurse Communication: Mobility status PT Visit Diagnosis: Other abnormalities of gait and mobility (R26.89);Difficulty in walking, not elsewhere classified (R26.2);Pain Pain - Right/Left: Left Pain - part of body: Leg     Time: 0920-0936 PT Time Calculation (min) (ACUTE ONLY): 16 min  Charges:  $Therapeutic Exercise: 8-22 mins                     Stacie Glaze, PT DPT Acute Rehabilitation Services Pager [phone]  Office [phone]    Roxine Caddy E Ruffin Pyo 08/20/2021, 9:44 AM

## 2021-08-20 NOTE — Progress Notes (Addendum)
Nutrition Follow-up  DOCUMENTATION CODES:   Obesity unspecified  INTERVENTION:   D/C Ensure Enlive, patient not drinking.  Try Ensure Max po once daily to maximize protein intake, each supplement provides 150 kcal and 30 grams of protein.   Continue Juven 1 packet PO BID, each packet provides 80 calories, 8 grams of carbohydrate, 2.5  grams of protein (collagen), 7 grams of L-arginine and 7 grams of L-glutamine; supplement contains CaHMB, Vitamins C, E, B12 and Zinc to promote wound healing.  NUTRITION DIAGNOSIS:   Increased nutrient needs related to post-op healing as evidenced by estimated needs.  Ongoing   GOAL:   Patient will meet greater than or equal to 90% of their needs  Progressing   MONITOR:   PO intake, Supplement acceptance, Weight trends  REASON FOR ASSESSMENT:   Consult Diet education  ASSESSMENT:   Pt admitted with gangrene and osteomyelitis of L foot. 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  S/P L BKA 12/28. Wound VAC in place.   Patient remains on a carbohydrate modified diet.  He reports good appetite and good intake of meals.  He has not been drinking the Ensure Enlive/Plus supplements because he thinks he is eating enough. He has been drinking the Juven supplements, mostly twice daily.   Labs reviewed.  CBG: 151-164  Medications reviewed and include Novolog, MVI with minerals, Juven.  Plans for d/c to SNF for short term rehab.  Diet Order:   Diet Order             Diet Carb Modified Fluid consistency: Thin; Room service appropriate? Yes  Diet effective now                   EDUCATION NEEDS:   Education needs have been addressed  Skin:  Skin Assessment: Skin Integrity Issues: Skin Integrity Issues:: Wound VAC Wound Vac: L leg  Last BM:  1/1  Height:   Ht Readings from Last 1 Encounters:  08/11/21 6' 3.5" (1.918 m)    Weight:   Wt Readings from Last 1 Encounters:  08/11/21 130.6 kg    Estimated  Nutritional Needs:   Kcal:  2300-2500  Protein:  130-145g  Fluid:  >/=2.3L   Gabriel Rainwater RD, LDN, CNSC Please refer to Amion for contact information.

## 2021-08-20 NOTE — TOC Progression Note (Addendum)
Transition of Care [facility]) - Progression Note    Patient Details  Name: CUONG MOORMAN MRN: [id number] Date of Birth: Jun 09, 1966  [facility]) CM/SW Contact  Delilah Shan, LCSWA Phone Number: 08/20/2021, 9:55 AM  Clinical Narrative:     Update- CSW reached out to [facility] leadership Jiles Crocker in regards to being unable to reach [facility]. TOC leadership unable to reach [facility] as well. Steward Drone informed CSW to try and reach out to SNF again tomorrow. CSW will try [facility] SNF back first thing in the morning.  Update 12:58pm- CSW tried to call [facility] several more times. CSW tried front desk as well as Admissions. No answer. CSW still awaiting callback.  CSW requested updated PT note for SNF to start insurance auth. CSW called [facility] and LVM for Foye Clock in admissions to return call. CSW awaiting callback to confirm SNF bed for patient and once confirmed for facility to start insurance authorization for patient. CSW will continue to follow and assist with patients dc planning needs.  Expected Discharge Plan: Home w Home Health Services Barriers to Discharge: Continued Medical Work up, Family Issues  Expected Discharge Plan and Services Expected Discharge Plan: Home w Home Health Services     Post Acute Care Choice: Home Health Living arrangements for the past 2 months: Single Family Home                                       Social Determinants of Health (SDOH) Interventions    Readmission Risk Interventions No flowsheet data found.

## 2021-08-20 NOTE — Progress Notes (Signed)
[facility]  Subjective  -no complaints.   Objective (!) 179/91 93 (!) 97.3 F (36.3 C) (Oral) 19 96%  Intake/Output Summary (Last 24 hours) at 08/20/2021 0928 Last data filed at 08/20/2021 0700 Gross per 24 hour  Intake 700 ml  Output 2700 ml  Net -2000 ml   VAC to left below-knee amputation with good seal  Laboratory Lab Results: No results for input(s): WBC, HGB, HCT, PLT in the last 72 hours. BMET Recent Labs    08/18/21 0142 08/19/21 0300  NA 137 138  K 4.0 3.8  CL 105 106  CO2 24 24  GLUCOSE 155* 156*  BUN 20 17  CREATININE 1.26* 1.01  CALCIUM 8.1* 8.4*    COAG Lab Results  Component Value Date   INR 1.2 11/13/2020   No results found for: PTT  Assessment/Planning:  56 year old male status post left below-knee amputation by Dr. Stanford Breed.  He has vac to left lower extremity BKA with stump shrinker.  Discussed continue the [facility] unless he plans for discharge today.  I asked the nurse to page me if he is being discharged.  Marty Heck 08/20/2021 9:28 AM --

## 2021-08-21 ENCOUNTER — Other Ambulatory Visit (HOSPITAL_COMMUNITY): Payer: Self-pay

## 2021-08-21 DIAGNOSIS — I739 Peripheral vascular disease, unspecified: Secondary | ICD-10-CM | POA: Diagnosis not present

## 2021-08-21 DIAGNOSIS — E1151 Type 2 diabetes mellitus with diabetic peripheral angiopathy without gangrene: Secondary | ICD-10-CM | POA: Diagnosis not present

## 2021-08-21 DIAGNOSIS — I96 Gangrene, not elsewhere classified: Secondary | ICD-10-CM | POA: Diagnosis not present

## 2021-08-21 LAB — RESP PANEL BY RT-PCR (FLU A&B, COVID) ARPGX2
Influenza A by PCR: NEGATIVE
Influenza B by PCR: NEGATIVE
SARS Coronavirus 2 by RT PCR: NEGATIVE

## 2021-08-21 LAB — GLUCOSE, CAPILLARY
Glucose-Capillary: 169 mg/dL — ABNORMAL HIGH (ref 70–99)
Glucose-Capillary: 164 mg/dL — ABNORMAL HIGH (ref 70–99)

## 2021-08-21 MED ORDER — AMLODIPINE BESYLATE 10 MG PO TABS
10.0000 mg | ORAL_TABLET | Freq: Every day | ORAL | 0 refills | Status: AC
Start: 2021-08-22 — End: ?
  Filled 2021-08-21: qty 30, 30d supply, fill #0

## 2021-08-21 MED ORDER — CARVEDILOL 25 MG PO TABS
25.0000 mg | ORAL_TABLET | Freq: Two times a day (BID) | ORAL | 0 refills | Status: AC
  Filled 2021-08-21: qty 60, 30d supply, fill #0

## 2021-08-21 MED ORDER — LOSARTAN POTASSIUM 50 MG PO TABS
50.0000 mg | ORAL_TABLET | Freq: Every day | ORAL | 0 refills | Status: AC
  Filled 2021-08-21: qty 30, 30d supply, fill #0

## 2021-08-21 MED ORDER — KETOROLAC TROMETHAMINE 15 MG/ML IJ SOLN
15.0000 mg | Freq: Once | INTRAMUSCULAR | Status: AC
  Administered 2021-08-21: 15 mg via INTRAVENOUS
  Filled 2021-08-21: qty 1

## 2021-08-21 MED ORDER — GABAPENTIN 100 MG PO CAPS
100.0000 mg | ORAL_CAPSULE | Freq: Three times a day (TID) | ORAL | Status: DC
  Administered 2021-08-21: 100 mg via ORAL
  Filled 2021-08-21: qty 1

## 2021-08-21 NOTE — Discharge Summary (Signed)
Family Medicine Teaching [facility] Discharge Summary  Patient name: Roy Taylor Medical record number: [id number] Date of birth: 1965/10/30 Age: 56 y.o. Gender: male Date of Admission: 08/11/2021  Date of Discharge: 08/21/2020 Admitting Physician: Leighton Roach McDiarmid, MD  Primary Care Provider: Renaldo Harrison, DO Consultants: VVS  Indication for Hospitalization:  left toe gangrene with superimposed osteomyelitis  Discharge Diagnoses/Problem List:  Gangrene of left foot (HCC) PAD (peripheral artery disease) (HCC) Diabetes mellitus type 2 with peripheral artery disease (HCC)  Hypertension associated with diabetes (HCC) Hyperlipidemia associated with type 2 diabetes mellitus (HCC) Morbid obesity (HCC) and BMI 35.0-35.9,adult OBSTRUCTIVE SLEEP APNEA 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 LLE with wound vac in place and surgical dressing Per, Dr. Elliot Gurney  Brief [facility] Course:  Roy Taylor is a 56 y.o. 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 Hospital course as outlined below:  L great toe gangrene causing severe sepsis Patient presented with worsening left great toe pain x2 weeks. In the ER, X-ray was concerning for gas gangrene. Patient received Zosyn x1 dose and was continued on Vancomycin, Cefepime, and Clindamycin. MRI of the foot showed signs of early osteomyelitis. VVS was consulted and initially planned for delayed intervention with angiography due to holiday scheduling. Patient ended up meeting severe sepsis criteria overnight  and an open amputation of the first toe was completed on 12/25. Culture results from the amputation allowed for antibiotic narrowing to Unasyn. LLE angiogram on 12/27 showed occlusion of all 3 tibial vessels of the LLE. Attempted angioplasty of the left anterior tibial artery was unsuccessful and patient underwent a BKA on 12/28. A wound vac was placed and continued until discharge. Was followed by PT/OT who recommended discharge to SNF.  PAD   HLD  Vascular surgery was consulted during hospitalization and patient underwent a BKA of the LLE. Patient was started on daily ASA and Rosuvastatin was increased to 10mg  QHS. VVS recommended tighter control of blood pressure, CBG, and hyperlipidemia.  T2DM HbA1c 9.1 on admission. Home medications initially held. Patient was started on sliding scale insulin. Glucose significantly improved with amputation. Patient was discharged on home regimen.   HTN BP elevated on admission secondary to severe sepsis infection. Patient was initially continued on home lisinopril until he developed a significant AKI and it was discontinued. Home carvedilol was continued. Started patient on amlodipine 5mg  daily and eventually increased to 10 mg. Lisinopril was discontinued and patient was discharged on losartan 50 mg.    Other chronic conditions were medically managed with home medications and formulary alternatives as necessary    Issues for Follow Up:  Continue to monitor Lipids with goal <70 Monitor BP, home Lisinopril was discontinued and patient was started on amlodipine and losartan. Consider if patient qualifies for CGM if he remains uncontrolled.  CBC to ensure improving anemia given acute on chronic blood loss with surgery  Significant Procedures: Left great toe amputation 12/25. LLE BKA 12/28.  Significant Labs and Imaging:  Recent Labs  Lab 08/15/21 0338 08/16/21 0123 08/17/21 0245  WBC 15.1* 11.0* 8.4  HGB 10.4* 9.7* 9.3*  HCT 31.3* 29.8* 28.8*  PLT  551* 567* 557*    Recent Labs  Lab 08/15/21 0338 08/16/21 0123 08/17/21 0245 08/18/21 0142 08/19/21 0300  NA 136 137 134* 137 138  K 3.8 3.8 3.5 4.0 3.8  CL 107 106 106 105 106  CO2 20* 22 22 24 24   GLUCOSE 120* 173* 160* 155* 156*  BUN 18 16 18 20 17   CREATININE 1.28* 1.24 1.30* 1.26* 1.01  CALCIUM 8.2* 8.0* 7.7* 8.1* 8.4*     Results/Tests Pending at Time of Discharge: n/a  Discharge Medications:  Allergies as of 08/21/2021   No Known Allergies      Medication List     STOP taking these medications    aspirin 325 MG tablet Replaced by: aspirin 81 MG EC tablet   ibuprofen 200 MG tablet Commonly known as: ADVIL   lisinopril 20 MG tablet Commonly known as: ZESTRIL       TAKE these medications    acetaminophen 325 MG tablet Commonly known as: TYLENOL Take 2 tablets (650 mg total) by mouth every 6 (six) hours.   amLODipine 10 MG tablet Commonly known as: NORVASC Take 1 tablet (10 mg total) by mouth daily. Start taking on: August 22, 2021   aspirin 81 MG EC tablet Take 1 tablet (81 mg total) by mouth daily. Swallow whole. Replaces: aspirin 325 MG tablet   carvedilol 25 MG tablet Commonly known as: COREG Take 1 tablet (25 mg total) by mouth 2 (two) times daily with a meal. What changed:  medication strength how much to take   Farxiga 10 MG Tabs tablet Generic drug: dapagliflozin propanediol Take 10 mg by mouth daily.   insulin detemir 100 UNIT/ML FlexPen Commonly known as: LEVEMIR Inject 15 Units into the skin at bedtime. What changed: how much to take   losartan 50 MG tablet Commonly known as: COZAAR Take 1 tablet (50 mg total) by mouth daily. Start taking on: August 22, 2021   metFORMIN 1000 MG tablet Commonly known as: GLUCOPHAGE Take 1,000 mg by mouth 2 (two) times daily with a meal.   polyethylene glycol powder 17 GM/SCOOP powder Commonly known as: GLYCOLAX/MIRALAX Take 17 g by mouth 2 (two) times daily.   rosuvastatin 10 MG  tablet Commonly known as: CRESTOR Take 1 tablet (10 mg total) by mouth at bedtime. What changed:  medication strength how much to take when to take this   senna 8.6 MG Tabs tablet Commonly known as: SENOKOT Take 1 tablet (8.6 mg total) by mouth daily.        Discharge Instructions: Please refer to Patient Instructions section of EMR for full details.  Patient was counseled important signs and symptoms that should prompt return to medical care, changes in medications, dietary instructions, activity restrictions, and follow up appointments.   Follow-Up Appointments:  Future Appointments  Date Time Provider Department Center  08/28/2021  1:30 PM [facility]     Autry-Lott, August 24, 2021, DO 08/21/2021, 2:47 PM PGY-3, [facility]

## 2021-08-21 NOTE — Discharge Summary (Deleted)
Family Medicine Teaching [facility] Discharge Summary  Patient name: Roy Taylor Medical record number: [id number] Date of birth: 1965/10/30 Age: 56 y.o. Gender: male Date of Admission: 08/11/2021  Date of Discharge: 08/21/2020 Admitting Physician: Leighton Roach McDiarmid, MD  Primary Care Provider: Renaldo Harrison, DO Consultants: VVS  Indication for Hospitalization:  left toe gangrene with superimposed osteomyelitis  Discharge Diagnoses/Problem List:  Gangrene of left foot (HCC) PAD (peripheral artery disease) (HCC) Diabetes mellitus type 2 with peripheral artery disease (HCC)  Hypertension associated with diabetes (HCC) Hyperlipidemia associated with type 2 diabetes mellitus (HCC) Morbid obesity (HCC) and BMI 35.0-35.9,adult OBSTRUCTIVE SLEEP APNEA 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 LLE with wound vac in place and surgical dressing Per, Dr. Elliot Gurney  Brief [facility] Course:  Roy Taylor is a 56 y.o. 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 Hospital course as outlined below:  L great toe gangrene causing severe sepsis Patient presented with worsening left great toe pain x2 weeks. In the ER, X-ray was concerning for gas gangrene. Patient received Zosyn x1 dose and was continued on Vancomycin, Cefepime, and Clindamycin. MRI of the foot showed signs of early osteomyelitis. VVS was consulted and initially planned for delayed intervention with angiography due to holiday scheduling. Patient ended up meeting severe sepsis criteria overnight  and an open amputation of the first toe was completed on 12/25. Culture results from the amputation allowed for antibiotic narrowing to Unasyn. LLE angiogram on 12/27 showed occlusion of all 3 tibial vessels of the LLE. Attempted angioplasty of the left anterior tibial artery was unsuccessful and patient underwent a BKA on 12/28. A wound vac was placed and continued until discharge. Was followed by PT/OT who recommended discharge to SNF.  PAD   HLD  Vascular surgery was consulted during hospitalization and patient underwent a BKA of the LLE. Patient was started on daily ASA and Rosuvastatin was increased to 10mg  QHS. VVS recommended tighter control of blood pressure, CBG, and hyperlipidemia.  T2DM HbA1c 9.1 on admission. Home medications initially held. Patient was started on sliding scale insulin. Glucose significantly improved with amputation. Patient was discharged on home regimen.   HTN BP elevated on admission secondary to severe sepsis infection. Patient was initially continued on home lisinopril until he developed a significant AKI and it was discontinued. Home carvedilol was continued. Started patient on amlodipine 5mg  daily and eventually increased to 10 mg. Lisinopril was discontinued and patient was discharged on losartan 50 mg.    Other chronic conditions were medically managed with home medications and formulary alternatives as necessary    Issues for Follow Up:  Continue to monitor Lipids with goal <70 Monitor BP, home Lisinopril was discontinued and patient was started on amlodipine and losartan. Consider if patient qualifies for CGM if he remains uncontrolled.  CBC to ensure improving anemia given acute on chronic blood loss with surgery  Significant Procedures: Left great toe amputation 12/25. LLE BKA 12/28.  Significant Labs and Imaging:  Recent Labs  Lab 08/15/21 0338 08/16/21 0123 08/17/21 0245  WBC 15.1* 11.0* 8.4  HGB 10.4* 9.7* 9.3*  HCT 31.3* 29.8* 28.8*  PLT  551* 567* 557*   Recent Labs  Lab 08/15/21 0338 08/16/21 0123 08/17/21 0245 08/18/21 0142 08/19/21 0300  NA 136 137 134* 137 138  K 3.8 3.8 3.5 4.0 3.8  CL 107 106 106 105 106  CO2 20* 22 22 24 24   GLUCOSE 120* 173* 160* 155* 156*  BUN 18 16 18 20 17   CREATININE 1.28* 1.24 1.30* 1.26* 1.01  CALCIUM 8.2* 8.0* 7.7* 8.1* 8.4*    Results/Tests Pending at Time of Discharge: n/a  Discharge Medications:  Allergies as of 08/21/2021   No Known Allergies      Medication List     STOP taking these medications    aspirin 325 MG tablet Replaced by: aspirin 81 MG EC tablet   ibuprofen 200 MG tablet Commonly known as: ADVIL   lisinopril 20 MG tablet Commonly known as: ZESTRIL       TAKE these medications    acetaminophen 325 MG tablet Commonly known as: TYLENOL Take 2 tablets (650 mg total) by mouth every 6 (six) hours.   amLODipine 10 MG tablet Commonly known as: NORVASC Take 1 tablet (10 mg total) by mouth daily. Start taking on: August 22, 2021   aspirin 81 MG EC tablet Take 1 tablet (81 mg total) by mouth daily. Swallow whole. Replaces: aspirin 325 MG tablet   carvedilol 25 MG tablet Commonly known as: COREG Take 1 tablet (25 mg total) by mouth 2 (two) times daily with a meal. What changed:  medication strength how much to take   Farxiga 10 MG Tabs tablet Generic drug: dapagliflozin propanediol Take 10 mg by mouth daily.   insulin detemir 100 UNIT/ML FlexPen Commonly known as: LEVEMIR Inject 15 Units into the skin at bedtime. What changed: how much to take   losartan 50 MG tablet Commonly known as: COZAAR Take 1 tablet (50 mg total) by mouth daily. Start taking on: August 22, 2021   metFORMIN 1000 MG tablet Commonly known as: GLUCOPHAGE Take 1,000 mg by mouth 2 (two) times daily with a meal.   oxyCODONE 5 MG immediate release tablet Commonly known as: Oxy IR/ROXICODONE Take 1 tablet (5 mg total) by mouth every 6 (six) hours as needed for  moderate pain, severe pain or breakthrough pain.   polyethylene glycol powder 17 GM/SCOOP powder Commonly known as: GLYCOLAX/MIRALAX Take 17 g by mouth 2 (two) times daily.   rosuvastatin 10 MG tablet Commonly known as: CRESTOR Take 1 tablet (10 mg total) by mouth at bedtime. What changed:  medication strength how much to take when to take this   senna 8.6 MG Tabs tablet Commonly known as: SENOKOT Take 1 tablet (8.6 mg total) by mouth daily.        Discharge Instructions: Please refer to Patient Instructions section of EMR for full details.  Patient was counseled important signs and symptoms that should prompt return to medical care, changes in medications, dietary instructions, activity restrictions, and follow up appointments.   Follow-Up Appointments:  Future Appointments  Date Time Provider Department Center  08/28/2021  1:30 PM [facility]     Autry-Lott, August 24, 2021, DO 08/21/2021, 1:09 PM PGY-3, [facility]

## 2021-08-21 NOTE — Progress Notes (Signed)
Spoke with patient at bedside about possible discharge today. He is amenable to going to [facility]. He continues to endorse 7/10 left limb pain. Will give toradol x1. Discussed monitoring for decreased BP with pain management. Patient BP medication was increased from home dose during hospitalization. He mentioned a "far" medication for his blood pressure. With chart review, this would be farxiga which is a diabetes medication. Plan remains to give pain medication and monitor BP for possible discharge later today.   Lavonda Jumbo, DO 08/21/2021, 12:15 PM PGY-3, [facility]

## 2021-08-21 NOTE — Progress Notes (Signed)
Saw patient after receiving a page from patient's nurse about patient complaining of LLE pain which he rated a 10 out of 10.  On arrival at patient's bedside he described pain as a non radiating throbbing pain that woke him up from sleep. Pain started about 30 minutes ago which he rated 10 out of 10.  He said pain is not new but this time has not responded to Tylenol.  On exam there is no noticeable edema, skin changes or drainage. No signs of infection. He last received tylenol about 10-15 minutes ago per nurse. Will reassess pain in 30 minutes to see if tylenol improves pain.

## 2021-08-21 NOTE — Progress Notes (Signed)
Report given to Haig Prophet , Charity fundraiser  at [facility] in [place], [place]. Pt was picked up by [facility]. Last BP at d/c  158/88.

## 2021-08-21 NOTE — TOC Transition Note (Addendum)
[facility]) - CM/SW Discharge Note   Patient Details  Name: Roy Taylor MRN: [id number] Date of Birth: [birth year]  Transition of Care [facility]) CM/SW Contact:  Milas Gain, [place] Phone Number: 08/21/2021, 1:51 PM   Clinical Narrative:     [facility]   Anticipated DC date: 08/21/2021  Family notified: Patient declined- reports he will notify spouse   Transport by: Corey Harold  ?  Per MD patient ready for DC to [facility] SNF. RN, patient, and facility notified of DC. Discharge Summary sent to facility. RN given number for report tele#[phone] EX:124 RM# 36. DC packet on chart. Ambulance transport requested for patient.  CSW signing off.   Final next level of care: Skilled Nursing Facility Barriers to Discharge: No Barriers Identified   Patient Goals and CMS Choice Patient states their goals for this hospitalization and ongoing recovery are:: SNF CMS Medicare.gov Compare Post Acute Care list provided to:: Patient Choice offered to / list presented to : Patient  Discharge Placement              Patient chooses bed at: [facility] Patient to be transferred to facility by: [facility] Name of family member notified: Patient Declined reports he will notify spouse Patient and family notified of of transfer: 08/21/21  Discharge Plan and Services     Post Acute Care Choice: Home Health                               Social Determinants of Health (SDOH) Interventions     Readmission Risk Interventions No flowsheet data found.

## 2021-08-21 NOTE — TOC Progression Note (Addendum)
Transition of Care [facility]) - Progression Note    Patient Details  Name: ISAIH BULGER MRN: [id number] Date of Birth: 03-Sep-1965  Transition [facility] Care [facility] CM/SW Contact  Delilah Shan, LCSWA Phone Number: 08/21/2021, 9:55 AM  Clinical Narrative:     CSW spoke with Trula Ore at [facility] who confirmed she received insurance authorization approval for patient. Trula Ore confirmed patient can dc over today if medically ready. CSW informed MD. CSW updated patient.CSW will continue to follow and assist with patients dc planning needs.  Expected Discharge Plan: Home w Home Health Services Barriers to Discharge: Continued Medical Work up, Family Issues  Expected Discharge Plan and Services Expected Discharge Plan: Home w Home Health Services     Post Acute Care Choice: Home Health Living arrangements for the past 2 months: Single Family Home                                       Social Determinants of Health (SDOH) Interventions    Readmission Risk Interventions No flowsheet data found.

## 2021-08-21 NOTE — Progress Notes (Signed)
OT Cancellation Note  Patient Details Name: Roy Taylor MRN: [id number] DOB: May 21, 1966   Cancelled Treatment:    Reason Eval/Treat Not Completed: Patient declined, no reason specified;Other (comment) Pt reports getting ready to DC to SNF, pt declined OOB mobility, ADL participation and reports no questions/ concerns at this time, will follow acutely per POC.  Lenor Derrick., COTA/L Acute Rehabilitation Services [phone]   Barron Schmid 08/21/2021, 2:17 PM

## 2021-08-21 NOTE — Care Management Important Message (Signed)
Important Message  Patient Details  Name: Roy Taylor MRN: [id number] Date of Birth: 1965-12-08   Medicare Important Message Given:  Yes     [name] 08/21/2021, 11:34 AM

## 2021-08-21 NOTE — Progress Notes (Signed)
Vac removed at bedside.  Stump healing well. Sock replaced. Continue this and stump protector until he sees me in next 2-3 weeks for staple removal. OK for transfer to SNF.  Rande Brunt. Lenell Antu, MD [facility] Phone Number: [phone] 08/21/2021 2:45 PM

## 2021-08-21 NOTE — Progress Notes (Signed)
Family Medicine Teaching Service Daily Progress Note Intern Pager: [phone]  Patient name: Roy Taylor Medical record number: [id number] Date of birth: 1965/08/26 Age: 56 y.o. Gender: male  Primary Care Provider: Renaldo Harrison, DO Consultants: [facility] Code Status: Full  Pt Overview and Major Events to Date:  12/24-admitted 12/25-left great toe amputation 12/27-unsuccessful recanalization of left anterior tibial artery 12/28-left LE BKA  Assessment and Plan:  Roy Taylor. Roy Taylor is a 56 year old male presenting with left toe gangrene with superimposed osteomyelitis.  PMH is significant for T2DM, PAD, OSA, HLD, right BKA (03/22).  Medically stable and awaiting SNF placement  Left great toe gangrene   POD #6 Patient is postop day 6 of LE BKA.  Remains afebrile and stable. He reported throbbing pain on LLE overnight that woke him up from sleep. He rated pain 10/10 which did not respond to tylenol. Started him on 100 mg gabapentin. Wound care is managed by VVS.  To continue wound VAC until discharge per VVS. -VS following, appreciate recs -Wound care per VVS -Tylenol 600 mg every 6 hours -PT/OT -Medically stable and awaiting SNF placement  HTN BP in the last 24 hours 154-186/77-104. Slightly improved.  Home medication includes lisinopril.  Lisinopril discontinued due to AKI and losartan started.  Recently increased amlodipine to 10 mg daily.  Currently taking losartan, Coreg and amlodipine for blood pressure control. -We will continue losartan 50 mg daily -Continue Coreg 25 mg twice daily -Continue amlodipine to 10 mg daily -Continue routine vitals.  T2DM Last CBG 173 -Continue mSSI   FEN/GI: Carb modified PPx: None Dispo:SNF  awaiting bed availability . Barriers include pending SNF placement.   Subjective:  Patient report he slept well most of the night until he had a non radiating throbbing pain which woke him up. Pain was 10/10 and non responsive to tylenol, otherwise no other  complain or concern.  Objective: Temp:  [97.3 F (36.3 C)-98.6 F (37 C)] 98.6 F (37 C) (01/02 2027) Pulse Rate:  [90-98] 90 (01/02 2027) Resp:  [19] 19 (01/02 0428) BP: (154-190)/(77-110) 154/77 (01/02 2027) SpO2:  [96 %] 96 % (01/02 0428) Physical Exam: General:Awake, well appearing, NAD HEENT: Atraumatic, MMM, No sclera icterus CV: RRR, no murmurs, normal S1/S2 Pulm: CTAB, good WOB on RA Abd: Soft, no distension, no tenderness Skin: dry, warm Ext: Bilateral LE BKA. LLE with wound vac in place and surgical dressing  Laboratory: Recent Labs  Lab 08/15/21 0338 08/16/21 0123 08/17/21 0245  WBC 15.1* 11.0* 8.4  HGB 10.4* 9.7* 9.3*  HCT 31.3* 29.8* 28.8*  PLT 551* 567* 557*   Recent Labs  Lab 08/17/21 0245 08/18/21 0142 08/19/21 0300  NA 134* 137 138  K 3.5 4.0 3.8  CL 106 105 106  CO2 22 24 24   BUN 18 20 17   CREATININE 1.30* 1.26* 1.01  CALCIUM 7.7* 8.1* 8.4*  GLUCOSE 160* 155* 156*      Imaging/Diagnostic Tests: No new images or test   , MD 08/21/2021, 2:19 AM PGY-1, [facility] FPTS Intern pager: [phone], text pages welcome

## 2021-08-30 ENCOUNTER — Encounter (HOSPITAL_COMMUNITY): Payer: Medicare HMO

## 2021-09-04 ENCOUNTER — Ambulatory Visit (INDEPENDENT_AMBULATORY_CARE_PROVIDER_SITE_OTHER): Payer: Medicare HMO | Admitting: Physician Assistant

## 2021-09-04 ENCOUNTER — Other Ambulatory Visit: Payer: Self-pay

## 2021-09-04 VITALS — BP 126/73 | HR 86 | Temp 98.2°F | Resp 18 | Ht 75.0 in | Wt 288.0 lb

## 2021-09-04 DIAGNOSIS — I739 Peripheral vascular disease, unspecified: Secondary | ICD-10-CM

## 2021-09-04 NOTE — Progress Notes (Signed)
°  POST OPERATIVE OFFICE NOTE    CC:  F/u for surgery  HPI:  This is a 56 y.o. male who is s/p left BKA for PAD without options for revascularization on 08/15/21 by Dr. Dr. Lenell Antu.    Pt returns today for follow up.  Pt states his pain is well controlled and he is making good progress in rehab with the potential to be discharged tomorrow.  He denies fever and chills.  No Known Allergies  Current Outpatient Medications  Medication Sig Dispense Refill   acetaminophen (TYLENOL) 325 MG tablet Take 2 tablets (650 mg total) by mouth every 6 (six) hours.     amLODipine (NORVASC) 10 MG tablet Take 1 tablet (10 mg total) by mouth daily. 30 tablet 0   aspirin 81 MG EC tablet Take 1 tablet (81 mg total) by mouth daily. Swallow whole. 30 tablet 11   carvedilol (COREG) 25 MG tablet Take 1 tablet (25 mg total) by mouth 2 (two) times daily with a meal. 60 tablet 0   FARXIGA 10 MG TABS tablet Take 10 mg by mouth daily.     insulin detemir (LEVEMIR) 100 UNIT/ML FlexPen Inject 15 Units into the skin at bedtime. 10 mL 0   isosorbide dinitrate (ISORDIL) 30 MG tablet Take 30 mg by mouth daily.     losartan (COZAAR) 50 MG tablet Take 1 tablet (50 mg total) by mouth daily. 30 tablet 0   metFORMIN (GLUCOPHAGE) 1000 MG tablet Take 1,000 mg by mouth 2 (two) times daily with a meal.     metroNIDAZOLE (FLAGYL) 500 MG tablet Take 500 mg by mouth 3 (three) times daily.     rosuvastatin (CRESTOR) 10 MG tablet Take 1 tablet (10 mg total) by mouth at bedtime. 30 tablet 0   senna (SENOKOT) 8.6 MG TABS tablet Take 1 tablet (8.6 mg total) by mouth daily. 120 tablet 0   traMADol (ULTRAM) 50 MG tablet Take by mouth every 6 (six) hours as needed.     polyethylene glycol powder (GLYCOLAX/MIRALAX) 17 GM/SCOOP powder Take 17 g by mouth 2 (two) times daily. (Patient not taking: Reported on 09/04/2021) 238 g 0   No current facility-administered medications for this visit.     ROS:  See HPI  Physical Exam:     Incision:   Incision without erythema or drainage. Extremities:  stump warm without fluctuance  Neuro: sensation intact Lungs:  non labored breathing   Assessment/Plan:  This is a 56 y.o. male who is s/p:left BKA for PAD without options for revascularization on 08/15/21 by Dr. Dr. Lenell Antu.    The staples are intact.  The middle incision has separation without signs of infection.  He is now 3 weeks s/p amputation.  He will return next week for incision check and staple removal.  If the incision is not fully healed we will start wet to dry dressing changes. Patient agrees with this plan.  He will call our office if he has concerns.     Mosetta Pigeon PA-C [facility] [phone]   Clinic MD:  Lenell Antu

## 2021-09-11 ENCOUNTER — Other Ambulatory Visit: Payer: Self-pay

## 2021-09-11 ENCOUNTER — Ambulatory Visit (INDEPENDENT_AMBULATORY_CARE_PROVIDER_SITE_OTHER): Payer: Medicare HMO | Admitting: Physician Assistant

## 2021-09-11 VITALS — BP 180/102 | HR 89 | Temp 97.2°F | Ht 75.0 in

## 2021-09-11 DIAGNOSIS — I739 Peripheral vascular disease, unspecified: Secondary | ICD-10-CM

## 2021-09-11 NOTE — Progress Notes (Signed)
°  POST OPERATIVE OFFICE NOTE    CC:  F/u for surgery  HPI:  This is a 56 y.o. 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 Stanford Breed.  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  No Known Allergies  Current Outpatient Medications  Medication Sig Dispense Refill   acetaminophen (TYLENOL) 325 MG tablet Take 2 tablets (650 mg total) by mouth every 6 (six) hours.     amLODipine (NORVASC) 10 MG tablet Take 1 tablet (10 mg total) by mouth daily. 30 tablet 0   aspirin 81 MG EC tablet Take 1 tablet (81 mg total) by mouth daily. Swallow whole. 30 tablet 11   carvedilol (COREG) 25 MG tablet Take 1 tablet (25 mg total) by mouth 2 (two) times daily with a meal. 60 tablet 0   FARXIGA 10 MG TABS tablet Take 10 mg by mouth daily.     insulin detemir (LEVEMIR) 100 UNIT/ML FlexPen Inject 15 Units into the skin at bedtime. 10 mL 0   isosorbide dinitrate (ISORDIL) 30 MG tablet Take 30 mg by mouth daily.     losartan (COZAAR) 50 MG tablet Take 1 tablet (50 mg total) by mouth daily. 30 tablet 0   metFORMIN (GLUCOPHAGE) 1000 MG tablet Take 1,000 mg by mouth 2 (two) times daily with a meal.     metroNIDAZOLE (FLAGYL) 500 MG tablet Take 500 mg by mouth 3 (three) times daily.     polyethylene glycol powder (GLYCOLAX/MIRALAX) 17 GM/SCOOP powder Take 17 g by mouth 2 (two) times daily. 238 g 0   rosuvastatin (CRESTOR) 10 MG tablet Take 1 tablet (10 mg total) by mouth at bedtime. 30 tablet 0   senna (SENOKOT) 8.6 MG TABS tablet Take 1 tablet (8.6 mg total) by mouth daily. (Patient taking differently: Take 1 tablet by mouth daily. As needed) 120 tablet 0   traMADol (ULTRAM) 50 MG tablet Take by mouth every 6 (six) hours as needed.     No current facility-administered medications for this visit.     ROS:  See HPI  Physical Exam:  Vitals:   09/11/21 0855  BP: (!) 180/102  Pulse: 89  Temp: (!) 97.2 F (36.2 C)  Height: 6\' 3"  (1.905 m)    Incision:  L  BKA incision with some mid incision dehiscence Neuro: A&O  Assessment/Plan:  This is a 56 y.o. male who is s/p: L BKA  - staples removed from dehisced area only - encouraged patient to cleanse incision with soap and water daily - follow up in 2 weeks for wound check and to remove remainder of staples   Dagoberto Ligas, PA-C [facility] [phone]  Clinic MD:  Stanford Breed

## 2021-09-12 LAB — FUNGUS CULTURE WITH STAIN

## 2021-09-12 LAB — FUNGUS CULTURE RESULT

## 2021-09-12 LAB — FUNGAL ORGANISM REFLEX

## 2021-09-27 ENCOUNTER — Other Ambulatory Visit: Payer: Self-pay

## 2021-09-27 ENCOUNTER — Ambulatory Visit (INDEPENDENT_AMBULATORY_CARE_PROVIDER_SITE_OTHER): Payer: Medicare HMO | Admitting: Physician Assistant

## 2021-09-27 VITALS — BP 69/48 | HR 72 | Temp 98.2°F | Resp 20

## 2021-09-27 DIAGNOSIS — Z89512 Acquired absence of left leg below knee: Secondary | ICD-10-CM

## 2021-09-27 MED ORDER — TRAMADOL HCL 50 MG PO TABS: 50.0000 mg | ORAL_TABLET | Freq: Four times a day (QID) | ORAL | 0 refills | Status: AC | PRN

## 2021-09-27 NOTE — Progress Notes (Signed)
POST OPERATIVE OFFICE NOTE    CC:  F/u for surgery  HPI:  This is a 56 y.o. male who is s/p left BKA on 08/15/2021 by Dr. Lenell Antu.  This has been slow to heal.  He has hx of right BKA in 2022 by Dr. Darrick Penna, which he states was also hard to heal.    He does have an area on the anterior suture line that his wife is doing wet to dry dressing changes.  He states he feels it is getting better.  His BP is soft today.  He is asymptomatic and denies feeling bad or having any dizziness.  He states he took his BP meds this am.      No Known Allergies  Current Outpatient Medications  Medication Sig Dispense Refill   acetaminophen (TYLENOL) 325 MG tablet Take 2 tablets (650 mg total) by mouth every 6 (six) hours.     amLODipine (NORVASC) 10 MG tablet Take 1 tablet (10 mg total) by mouth daily. 30 tablet 0   aspirin 81 MG EC tablet Take 1 tablet (81 mg total) by mouth daily. Swallow whole. 30 tablet 11   carvedilol (COREG) 25 MG tablet Take 1 tablet (25 mg total) by mouth 2 (two) times daily with a meal. 60 tablet 0   FARXIGA 10 MG TABS tablet Take 10 mg by mouth daily.     insulin detemir (LEVEMIR) 100 UNIT/ML FlexPen Inject 15 Units into the skin at bedtime. 10 mL 0   isosorbide dinitrate (ISORDIL) 30 MG tablet Take 30 mg by mouth daily.     losartan (COZAAR) 50 MG tablet Take 1 tablet (50 mg total) by mouth daily. 30 tablet 0   metFORMIN (GLUCOPHAGE) 1000 MG tablet Take 1,000 mg by mouth 2 (two) times daily with a meal.     metroNIDAZOLE (FLAGYL) 500 MG tablet Take 500 mg by mouth 3 (three) times daily.     polyethylene glycol powder (GLYCOLAX/MIRALAX) 17 GM/SCOOP powder Take 17 g by mouth 2 (two) times daily. 238 g 0   senna (SENOKOT) 8.6 MG TABS tablet Take 1 tablet (8.6 mg total) by mouth daily. (Patient taking differently: Take 1 tablet by mouth daily. As needed) 120 tablet 0   traMADol (ULTRAM) 50 MG tablet Take 1 tablet (50 mg total) by mouth every 6 (six) hours as needed. 20 tablet 0    rosuvastatin (CRESTOR) 10 MG tablet Take 1 tablet (10 mg total) by mouth at bedtime. 30 tablet 0   No current facility-administered medications for this visit.     ROS:  See HPI  Physical Exam:  Today's Vitals   09/27/21 0845  BP: (!) 69/48  Pulse: 72  Resp: 20  Temp: 98.2 F (36.8 C)  TempSrc: Temporal  SpO2: 100%  PainSc: 9    There is no height or weight on file to calculate BMI.   Incision:     Extremities:  good range of motion left knee.  Prosthesis in place right leg.     Assessment/Plan:  This is a 56 y.o. male who is s/p: Left BKA on 08/15/2021 by Dr. Lenell Antu  -staples removed today.  He will continue wet to dry dressing changes on the wound.  We will see him back in 3 weeks for wound check.  He knows to call sooner if he has any issues.  -he was given an Rx today for Tramadol 50mg  q6h prn pain #20 no refill given he is still undergoing dressing changes.   He  is mostly taking this bid.  He understands this will be his last narcotic rx.  PDMP reviewed.     Doreatha Massed, [facility] [phone]   Clinic MD:  Edilia Bo

## 2021-10-01 LAB — ACID FAST CULTURE WITH REFLEXED SENSITIVITIES (MYCOBACTERIA): Acid Fast Culture: NEGATIVE

## 2021-10-23 ENCOUNTER — Ambulatory Visit: Payer: Medicare HMO

## 2021-12-21 NOTE — Progress Notes (Signed)
?HISTORY AND PHYSICAL  ? ? ? ?CC:  follow up. ?Requesting Provider:  Renaldo Harrison, DO ? ?HPI: This is a 56 y.o. male who is here today for follow up for PAD.  Pt has hx of  left BKA on 08/15/2021 by Dr. Lenell Antu.  This has been slow to heal.  He has hx of right BKA in 2022 by Dr. Darrick Penna, which he states was also hard to heal.   ? ?Pt was last seen 09/27/2021 and at that time, his anterior suture line was slow to heal and his wife was doing wet to dry dressing changes.  The wound was clean and close to being healed.   ? ?The pt returns today for follow up.  His wound on the left BKA has completely healed.  He denies any pain in the stump. He has prosthesis in place on the right leg.  He is in good spirits today.  He is eager for his left leg prosthesis and ready to get back to walking.  He states he and his family are very active in the church.  He tells me he is inspired by a gentleman at the [facility] that he has seen twice that is bilateral amputee and walks in to the clinic with his walker.   ? ?The pt is on a statin for cholesterol management.    ?The pt is on an aspirin.    Other AC:  none ?The pt is on CCB, BB for hypertension.  ?The pt does  have diabetes. ?Tobacco hx:  never ? ? ? ?Past Medical History:  ?Diagnosis Date  ? Ankle joint deformity 09/21/2018  ? 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 Formatting of this note might be different from the original.  ? COVID-19 10/29/2019  ? Degeneration of lumbar or lumbosacral intervertebral disc 12/03/2007  ? Diabetes mellitus without complication (HCC)   ? Diabetic foot infection (HCC) 11/04/2020  ? Diabetic ulcer of toe of right foot associated with type 2 diabetes mellitus (HCC) 11/01/2020  ? 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 Displacement of lumbar intervertebral disc without myelopathy 12/03/2007  ? Hx of BKA, right (HCC)   ? Hyperlipidemia associated with type 2 diabetes mellitus (HCC) 01/31/2021  ? Hypertension   ? 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 Morbid obesity with BMI of 40.0-44.9, adult (HCC) 09/21/2018  ? 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 Primary localized osteoarthrosis, ankle and foot 12/03/2007  ? Primary localized osteoarthrosis, pelvic region and thigh 12/03/2007  ? ? ?Past Surgical History:  ?Procedure Laterality Date  ? ABDOMINAL AORTOGRAM W/LOWER EXTREMITY N/A 11/07/2020  ? Procedure: ABDOMINAL AORTOGRAM W/LOWER EXTREMITY;  Surgeon: Nada Libman, MD;  Location: [facility];  Service: Cardiovascular;  Laterality: N/A;  ? ABDOMINAL AORTOGRAM W/LOWER EXTREMITY N/A 08/14/2021  ? Procedure: ABDOMINAL AORTOGRAM W/LOWER EXTREMITY;  Surgeon: Nada Libman, MD;  Location: [facility];  Service: Cardiovascular;  Laterality: N/A;  ? AMPUTATION Right 11/13/2020  ? Procedure: RIGHT BELOW KNEE AMPUTATION;  Surgeon: Sherren Kerns, MD;  Location: [facility] OR;  Service: Vascular;  Laterality: Right;  ? AMPUTATION Left 08/12/2021  ? Procedure: LEFT OPEN GREAT TOE AMPUTATION;  Surgeon: Leonie Douglas, MD;  Location: [facility] OR;  Service: Vascular;  Laterality: Left;  ? AMPUTATION Left 08/15/2021  ? Procedure: LEFT AMPUTATION BELOW KNEE WITH MYODESIS;  Surgeon: Leonie Douglas, MD;  Location: [facility]  OR;  Service: Vascular;  Laterality: Left;  ? APPLICATION OF WOUND VAC Left 08/15/2021  ? Procedure: APPLICATION OF WOUND VAC;  Surgeon: Leonie DouglasHawken, Thomas N, MD;  [facility];  Service: Vascular;  Laterality: Left;  ? PERIPHERAL VASCULAR BALLOON ANGIOPLASTY Right 11/07/2020  ? Procedure: PERIPHERAL VASCULAR BALLOON ANGIOPLASTY;  Surgeon: Nada LibmanBrabham, Vance W, MD;  Location: [facility];  Service: Cardiovascular;  Laterality: Right;  peroneal  ? PERIPHERAL VASCULAR BALLOON ANGIOPLASTY  08/14/2021  ? Procedure: PERIPHERAL VASCULAR BALLOON ANGIOPLASTY;  Surgeon: Nada LibmanBrabham, Vance W, MD;  Location: [facility];  Service: Cardiovascular;;  Unsuccessful PTA  ? ? ?No Known  Allergies ? ?Current Outpatient Medications  ?Medication Sig Dispense Refill  ? acetaminophen (TYLENOL) 325 MG tablet Take 2 tablets (650 mg total) by mouth every 6 (six) hours.    ? amLODipine (NORVASC) 10 MG tablet Take 1 tablet (10 mg total) by mouth daily. 30 tablet 0  ? aspirin 81 MG EC tablet Take 1 tablet (81 mg total) by mouth daily. Swallow whole. 30 tablet 11  ? carvedilol (COREG) 25 MG tablet Take 1 tablet (25 mg total) by mouth 2 (two) times daily with a meal. 60 tablet 0  ? FARXIGA 10 MG TABS tablet Take 10 mg by mouth daily.    ? insulin detemir (LEVEMIR) 100 UNIT/ML FlexPen Inject 15 Units into the skin at bedtime. 10 mL 0  ? isosorbide dinitrate (ISORDIL) 30 MG tablet Take 30 mg by mouth daily.    ? losartan (COZAAR) 50 MG tablet Take 1 tablet (50 mg total) by mouth daily. 30 tablet 0  ? metFORMIN (GLUCOPHAGE) 1000 MG tablet Take 1,000 mg by mouth 2 (two) times daily with a meal.    ? metroNIDAZOLE (FLAGYL) 500 MG tablet Take 500 mg by mouth 3 (three) times daily.    ? polyethylene glycol powder (GLYCOLAX/MIRALAX) 17 GM/SCOOP powder Take 17 g by mouth 2 (two) times daily. 238 g 0  ? rosuvastatin (CRESTOR) 10 MG tablet Take 1 tablet (10 mg total) by mouth at bedtime. 30 tablet 0  ? senna (SENOKOT) 8.6 MG TABS tablet Take 1 tablet (8.6 mg total) by mouth daily. (Patient taking differently: Take 1 tablet by mouth daily. As needed) 120 tablet 0  ? traMADol (ULTRAM) 50 MG tablet Take 1 tablet (50 mg total) by mouth every 6 (six) hours as needed. 20 tablet 0  ? ?No current facility-administered medications for this visit.  ? ? ?No family history on file. ? ?Social History  ? ?Socioeconomic History  ? Marital status: Married  ?  Spouse name: Not on file  ? Number of children: Not on file  ? Years of education: Not on file  ? Highest education level: Not on file  ?Occupational History  ? Not on file  ?Tobacco Use  ? Smoking status: Never  ? Smokeless tobacco: Never  ?Vaping Use  ? Vaping Use: Never used   ?Substance and Sexual Activity  ? Alcohol use: Not Currently  ? Drug use: Never  ? Sexual activity: Not on file  ?Other Topics Concern  ? Not on file  ?Social History Narrative  ? Not on file  ? ?Social Determinants of Health  ? ?Financial Resource Strain: Not on file  ?Food Insecurity: Not on file  ?Transportation Needs: Not on file  ?Physical Activity: Not on file  ?Stress: Not on file  ?Social Connections: Not on file  ?Intimate Partner Violence: Not on file  ? ? ? ?REVIEW  OF SYSTEMS:  ? ?[X]  denotes positive finding, [ ]  denotes negative finding ?Cardiac  Comments:  ?Chest pain or chest pressure:    ?Shortness of breath upon exertion:    ?Short of breath when lying flat:    ?Irregular heart rhythm:    ?    ?Vascular    ?Pain in calf, thigh, or hip brought on by ambulation:    ?Pain in feet at night that wakes you up from your sleep:     ?Blood clot in your veins:    ?Leg swelling:     ?    ?Pulmonary    ?Oxygen at home:    ?Productive cough:     ?Wheezing:     ?    ?Neurologic    ?Sudden weakness in arms or legs:     ?Sudden numbness in arms or legs:     ?Sudden onset of difficulty speaking or slurred speech:    ?Temporary loss of vision in one eye:     ?Problems with dizziness:     ?    ?Gastrointestinal    ?Blood in stool:     ?Vomited blood:     ?    ?Genitourinary    ?Burning when urinating:     ?Blood in urine:    ?    ?Psychiatric    ?Major depression:     ?    ?Hematologic    ?Bleeding problems:    ?Problems with blood clotting too easily:    ?    ?Skin    ?Rashes or ulcers:    ?    ?Constitutional    ?Fever or chills:    ? ? ?PHYSICAL EXAMINATION: ? ?Today's Vitals  ? 12/25/21 0949 12/25/21 0953  ?BP: (!) 182/101 (!) 181/106  ?Pulse: 75   ?Resp: 18   ?Temp: 97.7 ?F (36.5 ?C)   ?TempSrc: Temporal   ?SpO2: 97%   ?Weight: 288 lb (130.6 kg)   ?Height: 6\' 3"  (1.905 m)   ?PainSc: 2    ? ?Body mass index is 36 kg/m?. ? ? ?General:  WDWN in NAD; vital signs documented above ?Gait: Not observed ?HENT: WNL,  normocephalic ?Pulmonary: normal non-labored breathing , without wheezing ?Cardiac: regular HR, without carotid bruits ?Skin: without rashes ?Extremities: without ischemic changes, without Gangrene , without c

## 2021-12-25 ENCOUNTER — Ambulatory Visit (INDEPENDENT_AMBULATORY_CARE_PROVIDER_SITE_OTHER): Payer: Medicare HMO | Admitting: Physician Assistant

## 2021-12-25 VITALS — BP 181/106 | HR 75 | Temp 97.7°F | Resp 18 | Ht 75.0 in | Wt 288.0 lb

## 2021-12-25 DIAGNOSIS — Z89512 Acquired absence of left leg below knee: Secondary | ICD-10-CM | POA: Diagnosis not present

## 2022-01-10 ENCOUNTER — Telehealth: Payer: Self-pay

## 2022-01-10 NOTE — Telephone Encounter (Signed)
Pt called with request for crutches rx but he does not know where he wants it sent to. We have spoken to [facility]; they do not typically provide durable medical equipment. I have advised pt to check with his PCP and call us back if he is still having difficulties obtaining this.

## 2022-01-15 ENCOUNTER — Telehealth: Payer: Self-pay

## 2022-01-15 NOTE — Telephone Encounter (Signed)
Pt called with questions about obtaining his hand crutches. I have called [facility] at [phone] who sells them without a rx for $45. Pt has been made aware of this.

## 2022-12-18 DEATH — deceased

## 2023-08-17 IMAGING — MR MR FOOT*L* WO/W CM
9 series · 40 of 40 positions shown · IV contrast (gadavist)
Comparison: X-ray 08/11/2021

CLINICAL DATA: Diabetic ulceration of the left great toe

EXAM:
MRI OF THE LEFT FOREFOOT WITHOUT AND WITH CONTRAST
TECHNIQUE: Multiplanar, multisequence MR imaging of the left forefoot was
performed both before and after administration of intravenous
contrast.
CONTRAST:  10mL GADAVIST GADOBUTROL 1 MMOL/ML IV SOLN

[Series 4: T1 · coronal · left · 3.0mm · 0.47mm/px · 5 of 56 slices shown (1 of 2)]
[im 1/56]
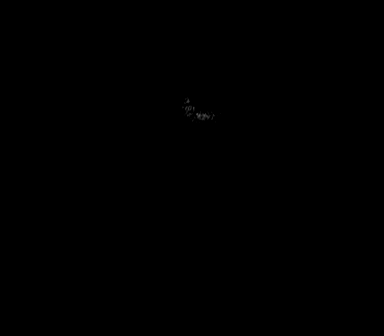
[im 14/56]
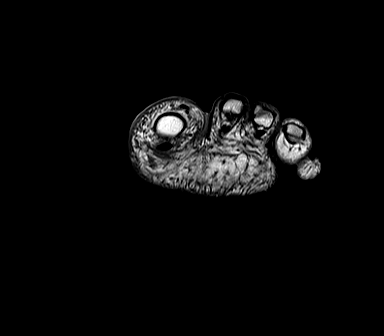
[im 28/56]
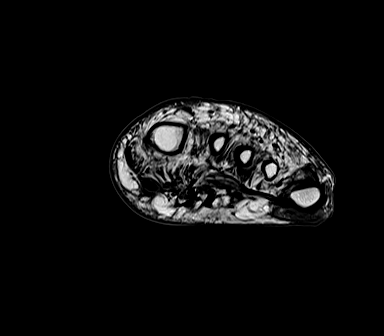
[im 42/56]
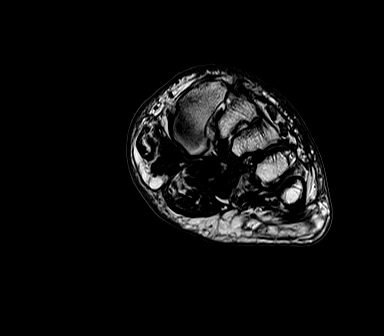
[im 56/56]
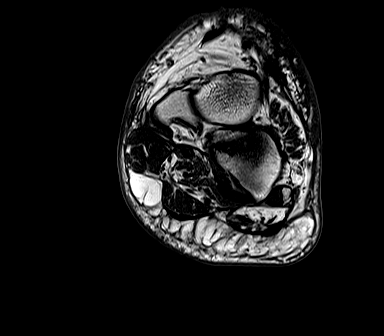

[Series 5: T2 fat-sat · coronal · left · 3.0mm · 0.49mm/px · 6 of 56 slices shown (1 of 2)]
[im 1/56]
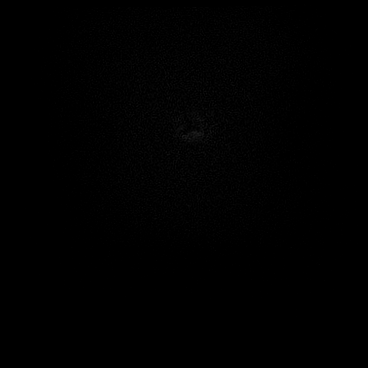
[im 12/56]
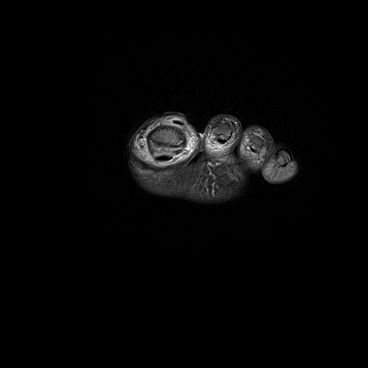
[im 23/56]
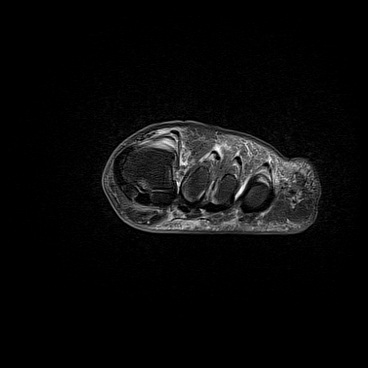
[im 34/56]
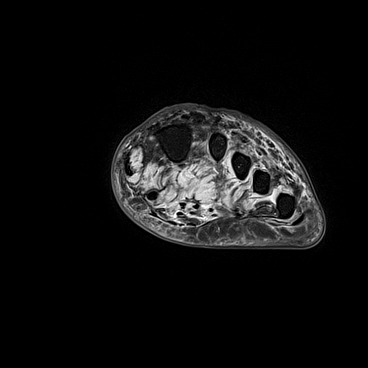
[im 45/56]
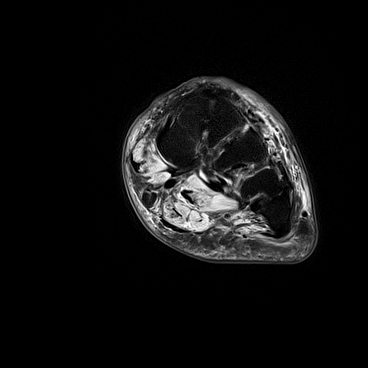
[im 56/56]
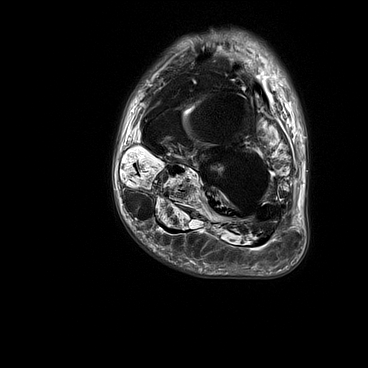

[Series 6: T1 · axial · left · 3.0mm · 0.55mm/px · z∈[-82,+36]mm · 3 of 32 slices shown (2 of 2)]
[im 1/32]
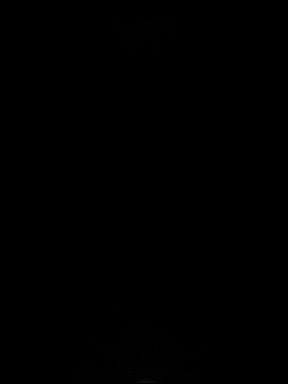
[im 16/32]
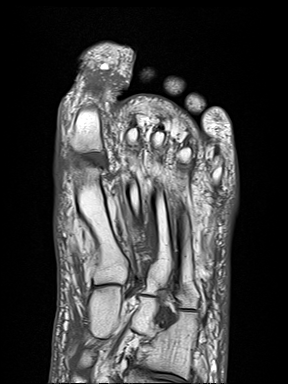
[im 32/32]
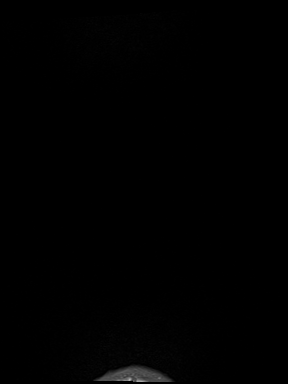

[Series 7: T2 fat-sat · axial · left · 3.0mm · 0.62mm/px · z∈[-76,+42]mm · 3 of 32 slices shown (2 of 2)]
[im 1/32]
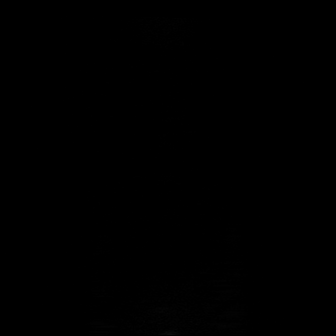
[im 16/32]
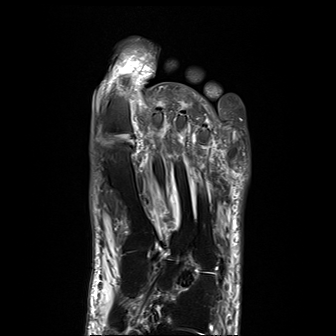
[im 32/32]
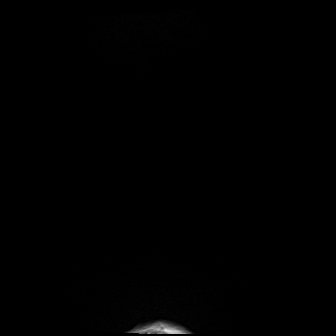

[Series 8: STIR · sagittal · left · 3.0mm · 0.82mm/px · 4 of 36 slices shown]
[im 1/36]
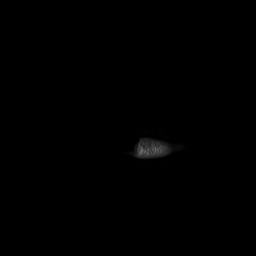
[im 12/36]
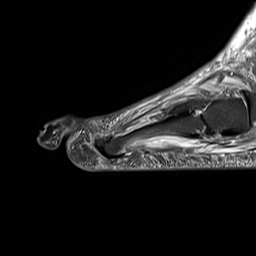
[im 24/36]
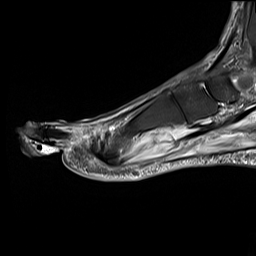
[im 36/36]
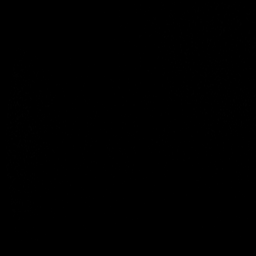

[Series 9: T1 fat-sat · coronal · non-contrast · left · 3.0mm · 0.59mm/px · 6 of 56 slices shown (1 of 3)]
[im 1/56]
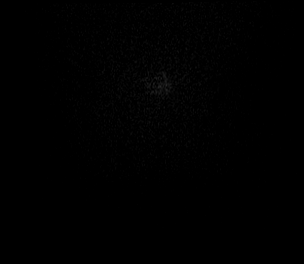
[im 12/56]
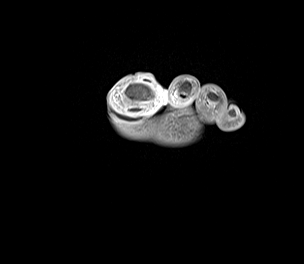
[im 23/56]
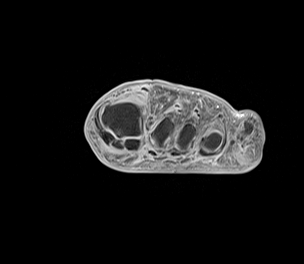
[im 34/56]
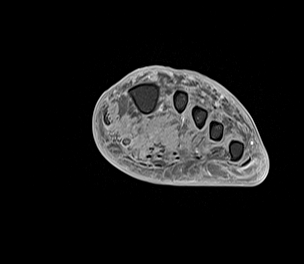
[im 45/56]
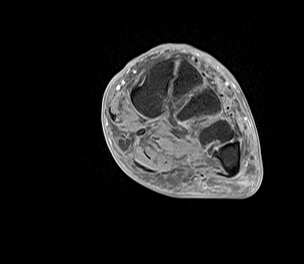
[im 56/56]
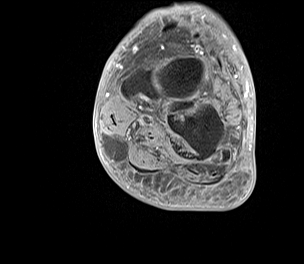

[Series 11: T1 fat-sat post-contrast · coronal · left · 3.0mm · 0.59mm/px · 6 of 56 slices shown]
[im 1/56]
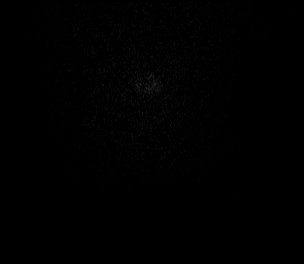
[im 12/56]
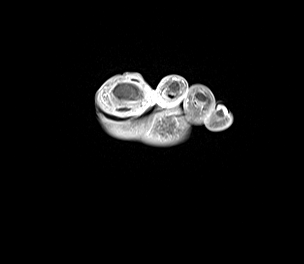
[im 23/56]
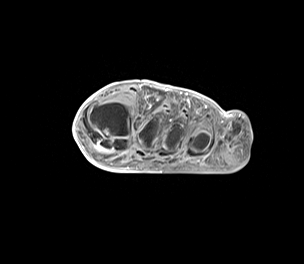
[im 34/56]
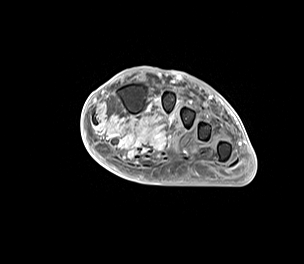
[im 45/56]
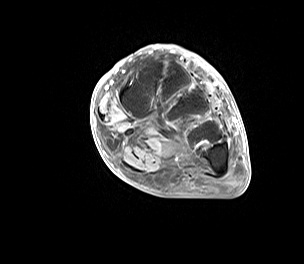
[im 56/56]
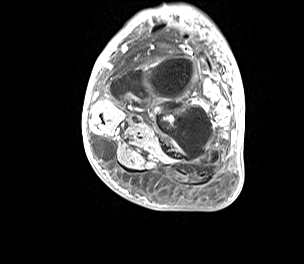

[Series 12: T1 fat-sat · sagittal · left · 3.0mm · 0.69mm/px · 4 of 36 slices shown (2 of 3)]
[im 1/36]
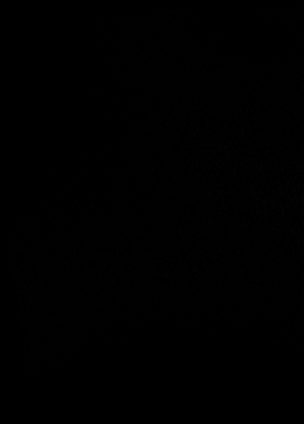
[im 12/36]
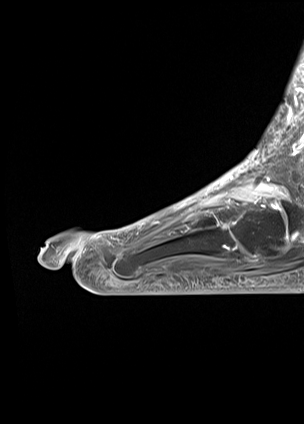
[im 24/36]
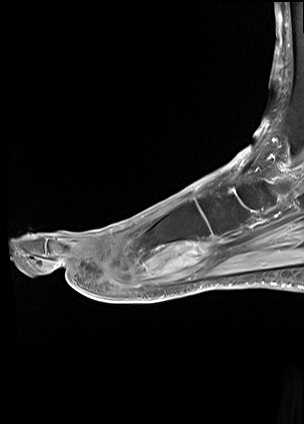
[im 36/36]
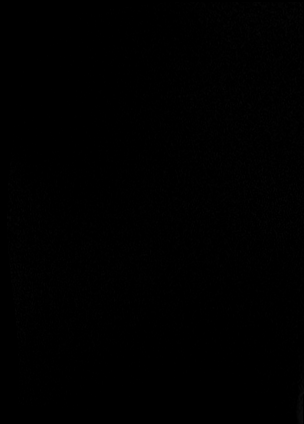

[Series 13: T1 fat-sat · axial · left · 3.0mm · 0.56mm/px · z∈[-46,+71]mm · 3 of 32 slices shown (3 of 3)]
[im 1/32]
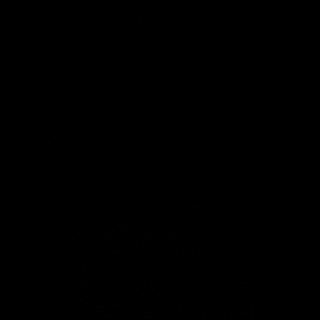
[im 16/32]
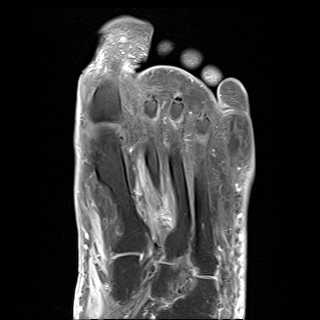
[im 32/32]
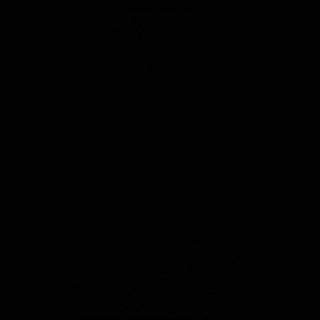

[40 of 40 positions shown; findings below may reference images not displayed]

FINDINGS: Bones/Joint/Cartilage

Bone marrow edema throughout the distal phalanx of the great toe
without discrete bone erosion or confluent low T1 marrow signal
changes. There is also bone marrow edema within the distal aspect of
the great toe proximal phalanx as well as throughout the distal
phalanx of the second toe. No site of confluent low T1 marrow signal
change within the forefoot. No acute fracture. No dislocation. Mild
osteoarthritis is most pronounced at the first MTP joint where there
is a slight hallux valgus deformity. Hammertoe deformities of the
lesser toes.

Ligaments

Intact Lisfranc ligament. Collateral ligaments of the forefoot are
intact.

Muscles and Tendons

Mild fatty infiltration of the intrinsic foot musculature. Diffuse
edema-like T2 signal of the foot musculature. Intact flexor and
extensor tendons. No tenosynovitis.

Soft tissues

Soft tissue edema and enhancement of the great toe and second toe.
Scattered foci of susceptibility within the soft tissues of the
great toe compatible with soft tissue air, as seen radiographically.
No organized or rim enhancing fluid collections.
IMPRESSION: 1. Soft tissue edema and enhancement of the great toe and second toe
compatible with cellulitis. Scattered foci of susceptibility within
the soft tissues of the great toe compatible with soft tissue air,
as seen radiographically, which may be secondary to open wound or
infection with gas-forming organism. No organized or rim enhancing
fluid collections.
2. Bone marrow edema throughout the distal phalanx of the great toe
and distal aspect of the great toe proximal phalanx as well as
throughout the distal phalanx of the second toe. Findings are most
suggestive of early acute osteomyelitis in the setting of adjacent
soft tissue infection.
3. Diffuse edema-like signal of the foot musculature, which may
reflect a combination of denervation changes and myositis.
